# Patient Record
Sex: Female | Born: 1950
Health system: Southern US, Community
[De-identification: ages and names within clinical notes are randomized; demographics above are authoritative.]

## PROBLEM LIST (undated history)

## (undated) DIAGNOSIS — E785 Hyperlipidemia, unspecified: Secondary | ICD-10-CM

## (undated) DIAGNOSIS — I10 Essential (primary) hypertension: Secondary | ICD-10-CM

## (undated) DIAGNOSIS — E119 Type 2 diabetes mellitus without complications: Secondary | ICD-10-CM

## (undated) DIAGNOSIS — N189 Chronic kidney disease, unspecified: Secondary | ICD-10-CM

## (undated) HISTORY — PX: ABDOMINAL HYSTERECTOMY: SHX81

## (undated) HISTORY — DX: Chronic kidney disease, unspecified: N18.9

---

## 2000-04-05 ENCOUNTER — Other Ambulatory Visit: Admission: RE | Admit: 2000-04-05 | Discharge: 2000-04-05 | Payer: Self-pay | Admitting: Internal Medicine

## 2001-04-10 ENCOUNTER — Other Ambulatory Visit: Admission: RE | Admit: 2001-04-10 | Discharge: 2001-04-10 | Payer: Self-pay | Admitting: Hematology and Oncology

## 2001-04-13 ENCOUNTER — Encounter: Admission: RE | Admit: 2001-04-13 | Discharge: 2001-04-13 | Payer: Self-pay | Admitting: Internal Medicine

## 2001-04-13 ENCOUNTER — Encounter: Payer: Self-pay | Admitting: Internal Medicine

## 2001-06-28 ENCOUNTER — Ambulatory Visit (HOSPITAL_COMMUNITY): Admission: RE | Admit: 2001-06-28 | Discharge: 2001-06-28 | Payer: Self-pay | Admitting: *Deleted

## 2001-09-21 ENCOUNTER — Encounter: Payer: Self-pay | Admitting: Internal Medicine

## 2001-09-21 ENCOUNTER — Encounter: Admission: RE | Admit: 2001-09-21 | Discharge: 2001-09-21 | Payer: Self-pay | Admitting: Internal Medicine

## 2001-11-08 ENCOUNTER — Inpatient Hospital Stay (HOSPITAL_COMMUNITY): Admission: RE | Admit: 2001-11-08 | Discharge: 2001-11-10 | Payer: Self-pay | Admitting: Obstetrics & Gynecology

## 2001-11-08 ENCOUNTER — Encounter (INDEPENDENT_AMBULATORY_CARE_PROVIDER_SITE_OTHER): Payer: Self-pay

## 2002-04-30 ENCOUNTER — Encounter: Admission: RE | Admit: 2002-04-30 | Discharge: 2002-04-30 | Payer: Self-pay | Admitting: Internal Medicine

## 2002-04-30 ENCOUNTER — Encounter: Payer: Self-pay | Admitting: Internal Medicine

## 2003-05-24 ENCOUNTER — Encounter: Admission: RE | Admit: 2003-05-24 | Discharge: 2003-05-24 | Payer: Self-pay | Admitting: Internal Medicine

## 2004-06-05 ENCOUNTER — Encounter: Admission: RE | Admit: 2004-06-05 | Discharge: 2004-06-05 | Payer: Self-pay | Admitting: Internal Medicine

## 2005-06-16 ENCOUNTER — Encounter: Admission: RE | Admit: 2005-06-16 | Discharge: 2005-06-16 | Payer: Self-pay | Admitting: Internal Medicine

## 2007-02-24 ENCOUNTER — Ambulatory Visit (HOSPITAL_COMMUNITY): Admission: RE | Admit: 2007-02-24 | Discharge: 2007-02-24 | Payer: Self-pay | Admitting: Gastroenterology

## 2007-02-24 ENCOUNTER — Encounter (INDEPENDENT_AMBULATORY_CARE_PROVIDER_SITE_OTHER): Payer: Self-pay | Admitting: Gastroenterology

## 2010-05-19 NOTE — Op Note (Signed)
NAME:  Holly Peters, Holly Peters NO.:  [id number]   MEDICAL RECORD NO.:  [id number]          PATIENT TYPE:  AMB   LOCATION:  ENDO                         FACILITY:  [facility]   PHYSICIAN:  Nelwyn Salisbury, M.D.  DATE OF BIRTH:  Jul 29, 1950   DATE OF PROCEDURE:  02/24/2007  DATE OF DISCHARGE:                               OPERATIVE REPORT   PROCEDURE PERFORMED:  Colonoscopy with cold biopsies x 4.   ENDOSCOPIST:  Nelwyn Salisbury, M.D.   INSTRUMENT USED:  Pentax video colonoscope.   INDICATIONS FOR PROCEDURE:  60 year old African American female  undergoing screening colonoscopy to rule out colonic polyps, masses,  etc.   PREPROCEDURE PREPARATION:  Informed consent was procured from the  patient.  The patient was fasted for 8 hours prior to the procedure and  prepped with a bottle of magnesium citrate and a gallon of GoLYTELY the  night prior to the procedure. The risks and benefits of the procedure  including a 10% miss rate of cancer and polyps were discussed with the  patient, as well.   PREPROCEDURE PHYSICAL:  The patient had stable vital signs.  Her chest  was clear to auscultation.  S1 and S2 regular.  Abdomen soft with normal  bowel sounds.   DESCRIPTION OF PROCEDURE:  The patient was placed in the left lateral  decubitus position and sedated with 65 mcg of Fentanyl and 6 mg of  Versed given intravenously in slow incremental doses.  Once the patient  was adequately sedated and maintained on low flow oxygen and continuous  cardiac monitoring, the Pentax video colonoscope was advanced from the  rectum to the cecum. The appendiceal orifice and the cecal valve were  clearly visualized and photographed.  There was some patchy inflammation  noted at the base of the cecum around the appendiceal orifice of unclear  significance. Biopsies were done (cold biopsies x4).  No other masses,  polyps, erosions, ulcerations, etc., were seen. The terminal ileum  appeared healthy  without lesions. Retroflexion in the rectum revealed no  abnormalities.   IMPRESSION:  Normal colonoscopy up to the terminal ileum except for  patchy inflammation noted on the appendiceal orifice, cold biopsies done  x4.   RECOMMENDATIONS:  1. Await pathology results.  2. Avoid all nonsteroidals including aspirin for the next two weeks.  3. Repeat colonoscopy in the next ten years unless the patient has any      abnormal symptoms in the interim.  4. Outpatient follow up as need arises in the future.  Further      recommendations will be made depending on the pathology results.      Nelwyn Salisbury, M.D.  Electronically Signed     JNM/MEDQ  D:  02/24/2007  T:  02/24/2007  Job:  [id number]   cc:   Theda Belfast. Baird Cancer, M.D.  Fax: [phone]

## 2010-05-22 NOTE — Procedures (Signed)
[place]  Patient:    Holly Peters, Holly Peters Visit Number: [id number] MRN: [id number]          Service Type: END Location: ENDO Attending Physician:  Juanita Craver Dictated by:   Nelwyn Salisbury, M.D. Proc. Date: 06/28/01 Admit Date:  06/28/2001   CC:         Bryon Lions, M.D.   Procedure Report  DATE OF BIRTH:  13-May-1950  PROCEDURE PERFORMED:  Screening colonoscopy.  ENDOSCOPIST:  Nelwyn Salisbury, M.D.  INSTRUMENT:  Olympus video colonoscope.  INDICATION FOR PROCEDURE:  Guaiac-positive stool in a 60 year old African-American female.  Rule out colonic polyps, masses, hemorrhoids, etc.  PREPROCEDURE PREPARATION:  Informed consent was procured from the patient. The patient was fasting for eight hours prior to the procedure and prepped with a bottle of magnesium citrate and a gallon of NuLytely the night prior to the procedure.  PREPROCEDURE PHYSICAL:  VITAL SIGNS:  Stable.  NECK:  Supple.  CHEST:  Clear to auscultation.  HEART:  S1, S2 regular.  ABDOMEN:  Soft with normal bowel sounds.  DESCRIPTION OF THE PROCEDURE:  The patient was placed in the left lateral decubitus position and sedated with 20 mg of Demerol and 7.5 mg of Versed intravenously.  Once the patient was adequately sedated and maintained on low-flow oxygen and continuous cardiac monitoring, the Olympus video colonoscope was advanced from the rectum to the cecum entirely without difficulty.  The entire colonic mucosa appeared healthy with a normal vascular pattern.  No masses, polyps, erosions, or diverticula were seen.  IMPRESSION:  Normal colonoscopy.  RECOMMENDATIONS: 1. Repeat guaiac testing to be done on an outpatient basis and further    recommendations made as warranted. 2. High fiber diet with liberal fruit intake has been advocated. Dictated by:   Nelwyn Salisbury, M.D. Attending Physician:  Juanita Craver DD:  06/28/01 TD:  06/29/01 Job:  [id number]

## 2010-05-22 NOTE — H&P (Signed)
NAME:  Holly Peters, RILING NO.:  [id number]   MEDICAL RECORD NO.:  [id number]                   PATIENT TYPE:  INP   LOCATION:  NA                                   FACILITY:  [facility]   PHYSICIAN:  Agnes Lawrence, M.D.         DATE OF BIRTH:  Oct 31, 1950   DATE OF ADMISSION:  DATE OF DISCHARGE:                                HISTORY & PHYSICAL   HISTORY OF PRESENT ILLNESS:  The patient is a 60 year old with a small  myomatous uterus who presents for laparoscopic assisted total vaginal  hysterectomy with bilateral salpingo-oophorectomy.  The patient has a long  history of uterine fibroids.  She complains of lower abdominal pain, back  pain, increasing abdominal girth, and urinary frequency.  Work-up to date  has included a pelvic ultrasound on September 18 which demonstrated a normal  sized uterus with multiple myomas.   PAST OB/GYN HISTORY:  She is status post two NSVDs.  Her last Pap smear was  in April 2003 and as per the patient's report was normal.  Last mammogram  was April as well and was normal.   PAST GYN SURGERY:  She has had a D&C in 1996 for abnormal uterine bleeding.   PAST MEDICAL HISTORY:  Type 2 diabetes mellitus and chronic hypertension.   PAST SURGICAL HISTORY:  As above.   MEDICATIONS:  The patient reports high blood pressure medication and  diabetes medication.   ALLERGIES:  No known drug allergies.   SOCIAL HISTORY:  She is married, living with her spouse.  Occupation:  She  is a Radiation protection practitioner.  She denies any tobacco use or any  significant history of alcohol use.  She does not use caffeinated beverages  and denies any illicit drug use.   FAMILY HISTORY:  Remarkable for adult-onset diabetes.   REVIEW OF SYMPTOMS:  GENITOURINARY:  As per history of present illness.  She  does have mild stress incontinence.   PHYSICAL EXAMINATION:  VITAL SIGNS:  Weight 160 pounds, temperature 98.8,  pulse 70, blood  pressure 140/88, respirations 20.  GENERAL:  African-American female appears stated age.  Normal body habitus.  LUNGS:  Clear to auscultation bilaterally.  HEART:  Regular rate and rhythm.  ABDOMEN:  Soft, nontender without masses.  Bowel sounds active.  PELVIC:  On female examination the external female genitalia are normal  appearing.  On speculum examination there is scant thin white discharge.  Cervix is clear, firm, and closed.  No visible lesions.  On bimanual  examination the uterus is mildly enlarged.  The position is anteverted.  No  tenderness noted.  On examination of the adnexa no masses or organomegaly,  nontender.   ASSESSMENT:  Small leiomyomatous uterus, symptomatic.   PLAN:  Laparoscopic assisted vaginal hysterectomy with prophylactic  bilateral salpingo-oophorectomy.  The risks, benefits, and alternatives of  surgery were discussed at length with the patient.  The patient was allowed  to ask  questions and stated satisfaction.                                               Agnes Lawrence, M.D.    Howell Pringle  D:  10/17/2001  T:  10/17/2001  Job:  [id number]

## 2010-05-22 NOTE — Op Note (Signed)
NAME:  ACEY, DOBBERSTEIN                     ACCOUNT NO.:  [id number]   MEDICAL RECORD NO.:  [id number]                   PATIENT TYPE:  INP   LOCATION:  9323                                 FACILITY:  [facility]   PHYSICIAN:  Agnes Lawrence, M.D.         DATE OF BIRTH:  August 26, 1950   DATE OF PROCEDURE:  11/08/2001  DATE OF DISCHARGE:                                 OPERATIVE REPORT   PREOPERATIVE DIAGNOSIS:  Uterine fibroids.   POSTOPERATIVE DIAGNOSES:  1. Uterine fibroids.  2. Pelvic adhesions.   PROCEDURE:  Laparoscopic-assisted total vaginal hysterectomy with bilateral  salpingo-oophorectomy.   SURGEON:  Agnes Lawrence, M.D.  Charles A. Jodi Mourning, M.D.   ANESTHESIA:  General endotracheal.   COMPLICATIONS:  None.   ESTIMATED BLOOD LOSS:  500 cc.   FLUIDS AND URINE OUTPUT:  As per the anesthesiologist.   FINDINGS AT LAPAROSCOPY:  There were adhesions involving the left fallopian  tube, ovary, and sigmoid colon.  The uterus was irregular in contour and  retroverted.   PROCEDURE:  The risks, benefits, indications, and alternatives of the  procedure were reviewed with the patient and informed consent was obtained.  The patient was taken to the operating room with an IV running.  The patient  was placed in the dorsal lithotomy position and prepped and draped in the  usual sterile fashion.  Bivalve speculum was then placed in the patient's  vagina and the anterior lip of the cervix was grasped with single-tooth  tenaculum.  A Hulka manipulator was then advanced into the uterus and  secured to the anterior lip of the cervix as a means to manipulate the  uterus.  The single-tooth tenaculum and speculum were then removed from the  vagina.  Attention was then turned to the patient's abdomen where a  10-mm skin incision was made in the umbilical fold.  The Veress needle was  then carefully introduced into the peritoneal cavity at a 45-degree angle  while tenting the  abdominal wall.  Intraperitoneal placement was confirmed  by use of a water-filled syringe and a drop in the intra-abdominal pressure  with insufflation of the CO2 gas.  The trocar and sleeve were then advanced  without difficulty into the abdomen where intra-abdominal placement was  confirmed by the laparoscope.  Two incisions were then made in the lower  quadrants.  The second and third trocar and sleeve were then advanced under  direct visualization.  Survey of the patient's pelvis revealed the above  findings.  Using the Harmonic scalpel, the adhesions described above were  lysed.  The round ligament on the left side was coagulated and transected  with the Harmonic scalpel.  The infundibulopelvic ligament was then  skeletonized and coagulated and transected with the Harmonic scalpel.  Excellent hemostasis was noted.  This was repeated on the patient's right.  The anterior leaf of the broad ligament was incised along the bladder  reflection to the midline from both  sides using the Harmonic scalpel.  At  this point, the vaginal part of the procedure was then performed.  A  weighted speculum was then placed into the vagina and the cervix grasped  with a Jacobs tenaculum.  The cervix was then injected circumferentially  with 1% lidocaine with 1:200,000 of epinephrine.  The cervix was then  circumferentially incised with the scalpel and the bladder dissected off the  pubovesical cervical fascia anteriorly with the Metzenbaum scissors.  The  anterior cul-de-sac was entered sharply.  The same procedure was performed  posteriorly and the posterior cul-de-sac was entered sharply without  difficulty.  At this point, Haney clamps were placed over the uterosacral  ligaments on either side.  These were then transected and suture ligated  with 0 Vicryl.  Hemostasis was assured.  The cardinal ligaments were clamped  on both sides, transected, and suture ligated in similar fashion.  The  uterine  arteries and broad ligaments were then serially clamped with Haney  clamps, transected, and suture ligated on both sides.  Excellent hemostasis  was noted.  At this point, a coring technique was used to morcellate the  uterus.  The uterus was then delivered along with the adnexa.  Any remaining  adhesions or broad ligament connections were clamped, transected, and suture  ligated.  The vaginal cuff angle was then closed with figure-of-eight  sutures of 0 Vicryl.  These were then plicated in the midline.  The  remainder of the vaginal cuff was closed with figure-of-eight stitches of 0  Vicryl in an interrupted fashion.  At this point, the abdomen was again  insufflated with CO2 gas and the vascular pedicles as well as the vaginal  cuff were reinspected and hemostasis was felt to be adequate.  The  instruments were then removed from the patient's abdomen and the incisions  repaired with 3-0 Vicryl.  The patient tolerated the procedure well.  Sponge, lap, and needle counts were correct x2.  The patient was taken to  the PACU in stable condition.                                               Agnes Lawrence, M.D.    Howell Pringle  D:  11/09/2001  T:  11/09/2001  Job:  [id number]

## 2012-07-31 ENCOUNTER — Emergency Department (HOSPITAL_BASED_OUTPATIENT_CLINIC_OR_DEPARTMENT_OTHER)
Admission: EM | Admit: 2012-07-31 | Discharge: 2012-07-31 | Disposition: A | Discharge status: Home / Self Care | Attending: Emergency Medicine | Admitting: Emergency Medicine

## 2012-07-31 ENCOUNTER — Encounter (HOSPITAL_BASED_OUTPATIENT_CLINIC_OR_DEPARTMENT_OTHER): Payer: Self-pay | Admitting: *Deleted

## 2012-07-31 DIAGNOSIS — E785 Hyperlipidemia, unspecified: Secondary | ICD-10-CM | POA: Insufficient documentation

## 2012-07-31 DIAGNOSIS — Z79899 Other long term (current) drug therapy: Secondary | ICD-10-CM | POA: Insufficient documentation

## 2012-07-31 DIAGNOSIS — I10 Essential (primary) hypertension: Secondary | ICD-10-CM | POA: Insufficient documentation

## 2012-07-31 DIAGNOSIS — Y939 Activity, unspecified: Secondary | ICD-10-CM | POA: Insufficient documentation

## 2012-07-31 DIAGNOSIS — S058X9A Other injuries of unspecified eye and orbit, initial encounter: Secondary | ICD-10-CM | POA: Insufficient documentation

## 2012-07-31 DIAGNOSIS — S0502XA Injury of conjunctiva and corneal abrasion without foreign body, left eye, initial encounter: Secondary | ICD-10-CM

## 2012-07-31 DIAGNOSIS — Y929 Unspecified place or not applicable: Secondary | ICD-10-CM | POA: Insufficient documentation

## 2012-07-31 DIAGNOSIS — X58XXXA Exposure to other specified factors, initial encounter: Secondary | ICD-10-CM | POA: Insufficient documentation

## 2012-07-31 DIAGNOSIS — E119 Type 2 diabetes mellitus without complications: Secondary | ICD-10-CM | POA: Insufficient documentation

## 2012-07-31 HISTORY — DX: Essential (primary) hypertension: I10

## 2012-07-31 HISTORY — DX: Type 2 diabetes mellitus without complications: E11.9

## 2012-07-31 HISTORY — DX: Hyperlipidemia, unspecified: E78.5

## 2012-07-31 MED ORDER — FLUORESCEIN SODIUM 1 MG OP STRP
ORAL_STRIP | OPHTHALMIC | Status: AC
  Filled 2012-07-31: qty 1

## 2012-07-31 MED ORDER — TETRACAINE HCL 0.5 % OP SOLN
OPHTHALMIC | Status: AC
  Filled 2012-07-31: qty 2

## 2012-07-31 MED ORDER — TOBRAMYCIN 0.3 % OP SOLN: 1.0000 [drp] | OPHTHALMIC | Status: DC

## 2012-07-31 MED ORDER — HYDROCODONE-ACETAMINOPHEN 5-325 MG PO TABS: 2.0000 | ORAL_TABLET | ORAL | Status: DC | PRN

## 2012-07-31 NOTE — ED Provider Notes (Signed)
CSN: [id number]     Arrival date & time 07/31/12  1829 History  This chart was scribed for non-physician practitioner working with Threasa Beards, MD by Jenne Campus, ED Scribe. This patient was seen in room MH10/MH10 and the patient's care was started at 8:28 PM.   First MD Initiated Contact with Patient 07/31/12 1935     Chief Complaint  Patient presents with  . Eye Problem    Patient is a 62 y.o. female presenting with eye problem. The history is provided by the patient. No language interpreter was used.  Eye Problem Location:  L eye Quality:  Foreign body sensation (sharp) Severity:  Moderate Onset quality:  Gradual Duration:  1 day Timing:  Constant Progression:  Worsening Chronicity:  New Context: not contact lens problem   Relieved by:  Closing eye Worsened by:  Eye movement Associated symptoms: no decreased vision, no itching and no redness     HPI Comments: Holly Peters is a 62 y.o. female who presents to the Emergency Department complaining of diffuse left eye pain that started upon waking yesterday. She describes the pain as a constant foreign body sensation n the medial aspect that became sharp after rubbing the eye. She denies sleeping under a fan or sleeping withy the window open. She denies having any chronic eye conditions or prior episodes of the same. She denies redness, drainage or visual changes. She has a h/o DM, HLD and HTN.Pt denies smoking and alcohol use.  Past Medical History  Diagnosis Date  . Hypertension   . Hyperlipemia   . Diabetes mellitus without complication    Past Surgical History  Procedure Laterality Date  . Abdominal hysterectomy     History reviewed. No pertinent family history. History  Substance Use Topics  . Smoking status: Never Smoker   . Smokeless tobacco: Not on file  . Alcohol Use: No   No OB history provided.  Review of Systems  Eyes: Positive for pain. Negative for redness and itching.  All other systems  reviewed and are negative.    Allergies  Review of patient's allergies indicates no known allergies.  Home Medications   Current Outpatient Rx  Name  Route  Sig  Dispense  Refill  . diltiazem (TIAZAC) 240 MG 24 hr capsule   Oral   Take 240 mg by mouth daily.         [name] olmesartan-hydrochlorothiazide (BENICAR HCT) 20-12.5 MG per tablet   Oral   Take 1 tablet by mouth daily.         . simvastatin (ZOCOR) 20 MG tablet   Oral   Take 20 mg by mouth every evening.         . sitaGLIPtan-metformin (JANUMET) 50-500 MG per tablet   Oral   Take 1 tablet by mouth 2 (two) times daily with a meal.          Triage Vitals: BP 150/80  Pulse 88  Temp(Src) 98.6 F (37 C)  Resp 16  Ht 5\' 1"  (1.549 m)  Wt 141 lb (63.957 kg)  BMI 26.66 kg/m2  Physical Exam  Nursing note and vitals reviewed. Constitutional: She is oriented to person, place, and time. She appears well-developed and well-nourished. No distress.  HENT:  Head: Normocephalic and atraumatic.  Eyes: EOM are normal.  Dark spot that appears vascular at 11 o'clock to the left eye, corneal abrasion to left eye, mild amount of mucus  Neck: Neck supple. No tracheal deviation present.  Cardiovascular: Normal  rate.   Pulmonary/Chest: Effort normal. No respiratory distress.  Musculoskeletal: Normal range of motion.  Neurological: She is alert and oriented to person, place, and time.  Skin: Skin is warm and dry.  Psychiatric: She has a normal mood and affect. Her behavior is normal.    ED Course   Procedures (including critical care time)  DIAGNOSTIC STUDIES: None performed.  COORDINATION OF CARE: 8:30 PM-Administered one drop of tetracaine and fluorescein to the left eye with pt's permission. 8:38 PM-Informed pt of eye exam findings. Discussed discharge plan which includes antibiotics with pt and pt agreed to plan. Also advised pt to follow up with her opthalmologist tomorrow and pt agreed. Addressed symptoms to return  for with pt.   Labs Reviewed - No data to display No results found. 1. Corneal abrasion, left, initial encounter     MDM  Dr. Canary Brim in to see  No obvious foreign body. I personally performed the services in this documentation, which was scribed in my presence.  The recorded information has been reviewed and considered.   Ronnald Collum.   [place], PA-C 07/31/12 2101

## 2012-07-31 NOTE — ED Provider Notes (Signed)
Medical screening examination/treatment/procedure(s) were conducted as a shared visit with non-physician practitioner(s) and myself.  I personally evaluated the patient during the encounter  Threasa Beards, MD 07/31/12 2106

## 2012-07-31 NOTE — ED Notes (Signed)
Pt c/o left eye pain ? Foreign object x 1 day

## 2014-05-20 DIAGNOSIS — L309 Dermatitis, unspecified: Secondary | ICD-10-CM | POA: Diagnosis not present

## 2014-05-20 DIAGNOSIS — E1122 Type 2 diabetes mellitus with diabetic chronic kidney disease: Secondary | ICD-10-CM | POA: Diagnosis not present

## 2014-05-20 DIAGNOSIS — N183 Chronic kidney disease, stage 3 (moderate): Secondary | ICD-10-CM | POA: Diagnosis not present

## 2014-05-20 DIAGNOSIS — N08 Glomerular disorders in diseases classified elsewhere: Secondary | ICD-10-CM | POA: Diagnosis not present

## 2014-05-20 DIAGNOSIS — I129 Hypertensive chronic kidney disease with stage 1 through stage 4 chronic kidney disease, or unspecified chronic kidney disease: Secondary | ICD-10-CM | POA: Diagnosis not present

## 2014-05-24 DIAGNOSIS — H25813 Combined forms of age-related cataract, bilateral: Secondary | ICD-10-CM | POA: Diagnosis not present

## 2014-05-24 DIAGNOSIS — E119 Type 2 diabetes mellitus without complications: Secondary | ICD-10-CM | POA: Diagnosis not present

## 2014-05-27 DIAGNOSIS — N183 Chronic kidney disease, stage 3 (moderate): Secondary | ICD-10-CM | POA: Diagnosis not present

## 2014-05-27 DIAGNOSIS — I129 Hypertensive chronic kidney disease with stage 1 through stage 4 chronic kidney disease, or unspecified chronic kidney disease: Secondary | ICD-10-CM | POA: Diagnosis not present

## 2014-05-27 DIAGNOSIS — E780 Pure hypercholesterolemia: Secondary | ICD-10-CM | POA: Diagnosis not present

## 2014-05-27 DIAGNOSIS — E1129 Type 2 diabetes mellitus with other diabetic kidney complication: Secondary | ICD-10-CM | POA: Diagnosis not present

## 2014-09-05 DIAGNOSIS — Z23 Encounter for immunization: Secondary | ICD-10-CM | POA: Diagnosis not present

## 2014-09-05 DIAGNOSIS — N183 Chronic kidney disease, stage 3 (moderate): Secondary | ICD-10-CM | POA: Diagnosis not present

## 2014-09-05 DIAGNOSIS — I129 Hypertensive chronic kidney disease with stage 1 through stage 4 chronic kidney disease, or unspecified chronic kidney disease: Secondary | ICD-10-CM | POA: Diagnosis not present

## 2014-09-05 DIAGNOSIS — E1122 Type 2 diabetes mellitus with diabetic chronic kidney disease: Secondary | ICD-10-CM | POA: Diagnosis not present

## 2014-09-05 DIAGNOSIS — N08 Glomerular disorders in diseases classified elsewhere: Secondary | ICD-10-CM | POA: Diagnosis not present

## 2014-09-05 DIAGNOSIS — Z79899 Other long term (current) drug therapy: Secondary | ICD-10-CM | POA: Diagnosis not present

## 2014-12-09 DIAGNOSIS — Z1231 Encounter for screening mammogram for malignant neoplasm of breast: Secondary | ICD-10-CM | POA: Diagnosis not present

## 2015-01-02 DIAGNOSIS — E1122 Type 2 diabetes mellitus with diabetic chronic kidney disease: Secondary | ICD-10-CM | POA: Diagnosis not present

## 2015-01-02 DIAGNOSIS — N183 Chronic kidney disease, stage 3 (moderate): Secondary | ICD-10-CM | POA: Diagnosis not present

## 2015-01-02 DIAGNOSIS — N08 Glomerular disorders in diseases classified elsewhere: Secondary | ICD-10-CM | POA: Diagnosis not present

## 2015-01-02 DIAGNOSIS — I129 Hypertensive chronic kidney disease with stage 1 through stage 4 chronic kidney disease, or unspecified chronic kidney disease: Secondary | ICD-10-CM | POA: Diagnosis not present

## 2015-03-13 ENCOUNTER — Emergency Department (HOSPITAL_BASED_OUTPATIENT_CLINIC_OR_DEPARTMENT_OTHER): Payer: Medicare Other

## 2015-03-13 ENCOUNTER — Encounter (HOSPITAL_BASED_OUTPATIENT_CLINIC_OR_DEPARTMENT_OTHER): Payer: Self-pay | Admitting: *Deleted

## 2015-03-13 ENCOUNTER — Emergency Department (HOSPITAL_BASED_OUTPATIENT_CLINIC_OR_DEPARTMENT_OTHER)
Admission: EM | Admit: 2015-03-13 | Discharge: 2015-03-14 | Disposition: A | Discharge status: Home / Self Care | Payer: Medicare Other | Attending: Emergency Medicine | Admitting: Emergency Medicine

## 2015-03-13 DIAGNOSIS — Y998 Other external cause status: Secondary | ICD-10-CM | POA: Insufficient documentation

## 2015-03-13 DIAGNOSIS — Z79899 Other long term (current) drug therapy: Secondary | ICD-10-CM | POA: Insufficient documentation

## 2015-03-13 DIAGNOSIS — M79674 Pain in right toe(s): Secondary | ICD-10-CM

## 2015-03-13 DIAGNOSIS — E119 Type 2 diabetes mellitus without complications: Secondary | ICD-10-CM | POA: Insufficient documentation

## 2015-03-13 DIAGNOSIS — Z7984 Long term (current) use of oral hypoglycemic drugs: Secondary | ICD-10-CM | POA: Diagnosis not present

## 2015-03-13 DIAGNOSIS — Y93E1 Activity, personal bathing and showering: Secondary | ICD-10-CM | POA: Insufficient documentation

## 2015-03-13 DIAGNOSIS — E785 Hyperlipidemia, unspecified: Secondary | ICD-10-CM | POA: Insufficient documentation

## 2015-03-13 DIAGNOSIS — Y9289 Other specified places as the place of occurrence of the external cause: Secondary | ICD-10-CM | POA: Diagnosis not present

## 2015-03-13 DIAGNOSIS — M79671 Pain in right foot: Secondary | ICD-10-CM | POA: Diagnosis not present

## 2015-03-13 DIAGNOSIS — L089 Local infection of the skin and subcutaneous tissue, unspecified: Secondary | ICD-10-CM | POA: Insufficient documentation

## 2015-03-13 DIAGNOSIS — W2209XA Striking against other stationary object, initial encounter: Secondary | ICD-10-CM | POA: Diagnosis not present

## 2015-03-13 DIAGNOSIS — I10 Essential (primary) hypertension: Secondary | ICD-10-CM | POA: Insufficient documentation

## 2015-03-13 DIAGNOSIS — S99921A Unspecified injury of right foot, initial encounter: Secondary | ICD-10-CM | POA: Diagnosis not present

## 2015-03-13 DIAGNOSIS — M7989 Other specified soft tissue disorders: Secondary | ICD-10-CM | POA: Diagnosis not present

## 2015-03-13 NOTE — ED Provider Notes (Signed)
CSN: [id number]     Arrival date & time 03/13/15  2059 History   First MD Initiated Contact with Patient 03/13/15 2313     Chief Complaint  Patient presents with  . Toe Injury     (Consider location/radiation/quality/duration/timing/severity/associated sxs/prior Treatment) HPI Comments: Patient states she "stubbed" her right great toe on the tub last week.  She has noted increased swelling and redness in the last several days.  Patient is a 65 y.o. female presenting with lower extremity pain. The history is provided by the patient. No language interpreter was used.  Foot Pain This is a new problem. The current episode started in the past 7 days. The problem occurs 2 to 4 times per day. The problem has been waxing and waning. The symptoms are aggravated by walking. She has tried acetaminophen and rest for the symptoms. The treatment provided mild relief.    Past Medical History  Diagnosis Date  . Hypertension   . Hyperlipemia   . Diabetes mellitus without complication [facility])    Past Surgical History  Procedure Laterality Date  . Abdominal hysterectomy     No family history on file. Social History  Substance Use Topics  . Smoking status: Never Smoker   . Smokeless tobacco: None  . Alcohol Use: No   OB History    No data available     Review of Systems  All other systems reviewed and are negative.     Allergies  Review of patient's allergies indicates no known allergies.  Home Medications   Prior to Admission medications   Medication Sig Start Date End Date Taking? Authorizing Provider  diltiazem (TIAZAC) 240 MG 24 hr capsule Take 240 mg by mouth daily.    Historical Provider, MD  HYDROcodone-acetaminophen (NORCO/VICODIN) 5-325 MG per tablet Take 2 tablets by mouth every 4 (four) hours as needed for pain. 07/31/12   Fransico Meadow, PA-C  olmesartan-hydrochlorothiazide (BENICAR HCT) 20-12.5 MG per tablet Take 1 tablet by mouth daily.    Historical Provider, MD    simvastatin (ZOCOR) 20 MG tablet Take 20 mg by mouth every evening.    Historical Provider, MD  sitaGLIPtan-metformin (JANUMET) 50-500 MG per tablet Take 1 tablet by mouth 2 (two) times daily with a meal.    Historical Provider, MD  tobramycin (TOBREX) 0.3 % ophthalmic solution Place 1 drop into the left eye every 4 (four) hours. 07/31/12   Fransico Meadow, PA-C   BP 152/79 mmHg  Pulse 90  Temp(Src) 97.9 F (36.6 C) (Oral)  Resp 20  Ht 5' 1.5" (1.562 m)  Wt 62.596 kg  BMI 25.66 kg/m2  SpO2 100% Physical Exam  Constitutional: She is oriented to person, place, and time. She appears well-developed and well-nourished.  HENT:  Head: Normocephalic.  Eyes: Conjunctivae are normal.  Neck: Neck supple.  Cardiovascular: Normal rate and regular rhythm.   Pulmonary/Chest: Effort normal and breath sounds normal.  Abdominal: Soft. Bowel sounds are normal.  Musculoskeletal: She exhibits edema and tenderness.       Feet:  Lymphadenopathy:    She has no cervical adenopathy.  Neurological: She is alert and oriented to person, place, and time.  Skin: Skin is warm and dry.  Psychiatric: She has a normal mood and affect.  Nursing note and vitals reviewed.   ED Course  Procedures (including critical care time) Labs Review Labs Reviewed - No data to display  Imaging Review Dg Foot Complete Right  03/13/2015  CLINICAL DATA:  stubbed right toe  Thursday morning; seemed fine for the next day or two then started having pain in distal 1st metatarsal x 5 days with redness and swelling gradually appearing EXAM: RIGHT FOOT COMPLETE - 3+ VIEW COMPARISON:  None. FINDINGS: There is no evidence of fracture or dislocation. No acute osseous abnormality. Diffuse small vessel calcification. IMPRESSION: No acute findings Electronically Signed   By: Skipper Cliche M.D.   On: 03/13/2015 22:01   I have personally reviewed and evaluated these images and lab results as part of my medical decision-making.   EKG  Interpretation None     Radiology results reviewed and shared with patient. Erythema without increased warmth.   MDM   Final diagnoses:  None    Right great toe pain. Localized inflammation at MTP joint. No indication of infection. Care instructions provided. Return precautions discussed. Follow-up with PCP.    Etta Quill, NP 03/14/15 [id number]  Everlene Balls, MD 03/14/15 [id number]

## 2015-03-13 NOTE — ED Notes (Signed)
She jammed her right foot when she was getting in the shower last week. Pain to her great toe. Swelling to her foot.

## 2015-03-13 NOTE — Discharge Instructions (Signed)
Hallux Rigidus Hallux rigidus is a condition involving pain and a loss of motion of the first (big) toe. The pain gets worse with lifting up (extension) of the toe. This is usually due to arthritic bony bumps (spurring) of the joint at the base of the big toe.  SYMPTOMS   Pain, with lifting up of the toe.  Tenderness over the joint where the big toe meets the foot.  Redness, swelling, and warmth over the top of the base of the big toe (sometimes).  Foot pain, stiffness, and limping. CAUSES  Hallux rigidus is caused by arthritis of the joint where the big toe meets the foot. The arthritis creates a bone spur that pinches the soft tissues when the toe is extended. RISK INCREASES WITH:  Tight shoes with a narrow toe box.  Family history of foot problems.  Gout and rheumatoid and psoriatic arthritis.  History of previous toe injury, including "turf toe."  Long first toe, flat feet, and other big toe bony bumps.  Arthritis of the big toe. PREVENTION   Wear wide-toed shoes that fit well.  Tape the big toe to reduce motion and to prevent pinching of the tissues between the bone.  Maintain physical fitness:  Foot and ankle flexibility.  Muscle strength and endurance. PROGNOSIS  This condition can usually be managed with proper treatment. However, surgery is typically required to prevent the problem from recurring.  RELATED COMPLICATIONS  Injury to other areas of the foot or ankle, caused by abnormal walking in an attempt to avoid the pain felt when walking normally. TREATMENT Treatment first involves stopping the activities that aggravate your symptoms. Ice and medicine can be used to reduce the pain and inflammation. Modifications to shoes may help reduce pain, including wearing stiff-soled shoes, shoes with a wide toe box, inserting a padded donut to relieve pressure on top of the joint, or wearing an arch support. Corticosteroid injections may be given to reduce  inflammation. MEDICATION   If pain medicine is needed, nonsteroidal anti-inflammatory medicines (aspirin and ibuprofen), or other minor pain relievers (acetaminophen), are often advised.  Do not take pain medicine for 7 days before surgery.  Prescription pain relievers are usually prescribed only after surgery. Use only as directed and only as much as you need.  Ointments for arthritis, applied to the skin, may give some relief.  Injections of corticosteroids may be given to reduce inflammation. HEAT AND COLD  Cold treatment (icing) relieves pain and reduces inflammation. Cold treatment should be applied for 10 to 15 minutes every 2 to 3 hours, and immediately after activity that aggravates your symptoms. Use ice packs or an ice massage.  Heat treatment may be used before performing the stretching and strengthening activities prescribed by your caregiver, physical therapist, or athletic trainer. Use a heat pack or a warm water soak. SEEK MEDICAL CARE IF:   Symptoms get worse or do not improve in 2 weeks, despite treatment.  After surgery you develop fever, increasing pain, redness, swelling, drainage of fluids, bleeding, or increasing warmth.  New, unexplained symptoms develop. (Drugs used in treatment may produce side effects.)   This information is not intended to replace advice given to you by your health care provider. Make sure you discuss any questions you have with your health care provider.   Document Released: 12/21/2004 Document Revised: 01/11/2014 Document Reviewed: 04/04/2008 Elsevier Interactive Patient Education [employer].

## 2015-03-14 DIAGNOSIS — S99921A Unspecified injury of right foot, initial encounter: Secondary | ICD-10-CM | POA: Diagnosis not present

## 2015-03-25 DIAGNOSIS — I129 Hypertensive chronic kidney disease with stage 1 through stage 4 chronic kidney disease, or unspecified chronic kidney disease: Secondary | ICD-10-CM | POA: Diagnosis not present

## 2015-03-25 DIAGNOSIS — N08 Glomerular disorders in diseases classified elsewhere: Secondary | ICD-10-CM | POA: Diagnosis not present

## 2015-03-25 DIAGNOSIS — E559 Vitamin D deficiency, unspecified: Secondary | ICD-10-CM | POA: Diagnosis not present

## 2015-03-25 DIAGNOSIS — E1122 Type 2 diabetes mellitus with diabetic chronic kidney disease: Secondary | ICD-10-CM | POA: Diagnosis not present

## 2015-03-25 DIAGNOSIS — Z0000 Encounter for general adult medical examination without abnormal findings: Secondary | ICD-10-CM | POA: Diagnosis not present

## 2015-03-25 DIAGNOSIS — N183 Chronic kidney disease, stage 3 (moderate): Secondary | ICD-10-CM | POA: Diagnosis not present

## 2015-05-02 DIAGNOSIS — M65311 Trigger thumb, right thumb: Secondary | ICD-10-CM | POA: Diagnosis not present

## 2015-05-30 DIAGNOSIS — M654 Radial styloid tenosynovitis [de Quervain]: Secondary | ICD-10-CM | POA: Diagnosis not present

## 2015-05-30 DIAGNOSIS — M65311 Trigger thumb, right thumb: Secondary | ICD-10-CM | POA: Diagnosis not present

## 2015-07-01 DIAGNOSIS — M65311 Trigger thumb, right thumb: Secondary | ICD-10-CM | POA: Diagnosis not present

## 2015-07-01 DIAGNOSIS — M654 Radial styloid tenosynovitis [de Quervain]: Secondary | ICD-10-CM | POA: Diagnosis not present

## 2015-07-03 DIAGNOSIS — I129 Hypertensive chronic kidney disease with stage 1 through stage 4 chronic kidney disease, or unspecified chronic kidney disease: Secondary | ICD-10-CM | POA: Diagnosis not present

## 2015-07-03 DIAGNOSIS — N183 Chronic kidney disease, stage 3 (moderate): Secondary | ICD-10-CM | POA: Diagnosis not present

## 2015-07-03 DIAGNOSIS — E7800 Pure hypercholesterolemia, unspecified: Secondary | ICD-10-CM | POA: Diagnosis not present

## 2015-07-03 DIAGNOSIS — E1129 Type 2 diabetes mellitus with other diabetic kidney complication: Secondary | ICD-10-CM | POA: Diagnosis not present

## 2015-07-14 DIAGNOSIS — M65311 Trigger thumb, right thumb: Secondary | ICD-10-CM | POA: Diagnosis not present

## 2015-07-14 DIAGNOSIS — M654 Radial styloid tenosynovitis [de Quervain]: Secondary | ICD-10-CM | POA: Diagnosis not present

## 2015-07-16 DIAGNOSIS — H43822 Vitreomacular adhesion, left eye: Secondary | ICD-10-CM | POA: Diagnosis not present

## 2015-07-16 DIAGNOSIS — E119 Type 2 diabetes mellitus without complications: Secondary | ICD-10-CM | POA: Diagnosis not present

## 2015-07-16 DIAGNOSIS — H25013 Cortical age-related cataract, bilateral: Secondary | ICD-10-CM | POA: Diagnosis not present

## 2015-07-23 DIAGNOSIS — E1122 Type 2 diabetes mellitus with diabetic chronic kidney disease: Secondary | ICD-10-CM | POA: Diagnosis not present

## 2015-07-23 DIAGNOSIS — N183 Chronic kidney disease, stage 3 (moderate): Secondary | ICD-10-CM | POA: Diagnosis not present

## 2015-07-23 DIAGNOSIS — N08 Glomerular disorders in diseases classified elsewhere: Secondary | ICD-10-CM | POA: Diagnosis not present

## 2015-07-23 DIAGNOSIS — I129 Hypertensive chronic kidney disease with stage 1 through stage 4 chronic kidney disease, or unspecified chronic kidney disease: Secondary | ICD-10-CM | POA: Diagnosis not present

## 2015-07-28 DIAGNOSIS — M65311 Trigger thumb, right thumb: Secondary | ICD-10-CM | POA: Diagnosis not present

## 2015-07-28 DIAGNOSIS — Z4789 Encounter for other orthopedic aftercare: Secondary | ICD-10-CM | POA: Diagnosis not present

## 2015-08-07 DIAGNOSIS — I129 Hypertensive chronic kidney disease with stage 1 through stage 4 chronic kidney disease, or unspecified chronic kidney disease: Secondary | ICD-10-CM | POA: Diagnosis not present

## 2015-08-25 DIAGNOSIS — Z4789 Encounter for other orthopedic aftercare: Secondary | ICD-10-CM | POA: Diagnosis not present

## 2015-08-25 DIAGNOSIS — M65311 Trigger thumb, right thumb: Secondary | ICD-10-CM | POA: Diagnosis not present

## 2015-09-01 ENCOUNTER — Other Ambulatory Visit: Payer: Self-pay

## 2015-10-07 DIAGNOSIS — M654 Radial styloid tenosynovitis [de Quervain]: Secondary | ICD-10-CM | POA: Diagnosis not present

## 2015-10-07 DIAGNOSIS — Z4789 Encounter for other orthopedic aftercare: Secondary | ICD-10-CM | POA: Diagnosis not present

## 2015-10-07 DIAGNOSIS — M65311 Trigger thumb, right thumb: Secondary | ICD-10-CM | POA: Diagnosis not present

## 2015-10-27 DIAGNOSIS — Z23 Encounter for immunization: Secondary | ICD-10-CM | POA: Diagnosis not present

## 2015-11-25 DIAGNOSIS — N183 Chronic kidney disease, stage 3 (moderate): Secondary | ICD-10-CM | POA: Diagnosis not present

## 2015-11-25 DIAGNOSIS — N08 Glomerular disorders in diseases classified elsewhere: Secondary | ICD-10-CM | POA: Diagnosis not present

## 2015-11-25 DIAGNOSIS — I129 Hypertensive chronic kidney disease with stage 1 through stage 4 chronic kidney disease, or unspecified chronic kidney disease: Secondary | ICD-10-CM | POA: Diagnosis not present

## 2015-11-25 DIAGNOSIS — E1122 Type 2 diabetes mellitus with diabetic chronic kidney disease: Secondary | ICD-10-CM | POA: Diagnosis not present

## 2015-12-23 DIAGNOSIS — Z1231 Encounter for screening mammogram for malignant neoplasm of breast: Secondary | ICD-10-CM | POA: Diagnosis not present

## 2016-03-09 DIAGNOSIS — N183 Chronic kidney disease, stage 3 (moderate): Secondary | ICD-10-CM | POA: Diagnosis not present

## 2016-03-09 DIAGNOSIS — E1122 Type 2 diabetes mellitus with diabetic chronic kidney disease: Secondary | ICD-10-CM | POA: Diagnosis not present

## 2016-03-09 DIAGNOSIS — N08 Glomerular disorders in diseases classified elsewhere: Secondary | ICD-10-CM | POA: Diagnosis not present

## 2016-03-09 DIAGNOSIS — I129 Hypertensive chronic kidney disease with stage 1 through stage 4 chronic kidney disease, or unspecified chronic kidney disease: Secondary | ICD-10-CM | POA: Diagnosis not present

## 2016-03-09 DIAGNOSIS — E559 Vitamin D deficiency, unspecified: Secondary | ICD-10-CM | POA: Diagnosis not present

## 2016-06-30 DIAGNOSIS — E1129 Type 2 diabetes mellitus with other diabetic kidney complication: Secondary | ICD-10-CM | POA: Diagnosis not present

## 2016-06-30 DIAGNOSIS — E7800 Pure hypercholesterolemia, unspecified: Secondary | ICD-10-CM | POA: Diagnosis not present

## 2016-06-30 DIAGNOSIS — N183 Chronic kidney disease, stage 3 (moderate): Secondary | ICD-10-CM | POA: Diagnosis not present

## 2016-06-30 DIAGNOSIS — I129 Hypertensive chronic kidney disease with stage 1 through stage 4 chronic kidney disease, or unspecified chronic kidney disease: Secondary | ICD-10-CM | POA: Diagnosis not present

## 2016-07-15 DIAGNOSIS — H25813 Combined forms of age-related cataract, bilateral: Secondary | ICD-10-CM | POA: Diagnosis not present

## 2016-07-15 DIAGNOSIS — E119 Type 2 diabetes mellitus without complications: Secondary | ICD-10-CM | POA: Diagnosis not present

## 2016-07-22 DIAGNOSIS — I129 Hypertensive chronic kidney disease with stage 1 through stage 4 chronic kidney disease, or unspecified chronic kidney disease: Secondary | ICD-10-CM | POA: Diagnosis not present

## 2016-07-22 DIAGNOSIS — N183 Chronic kidney disease, stage 3 (moderate): Secondary | ICD-10-CM | POA: Diagnosis not present

## 2016-07-22 DIAGNOSIS — N08 Glomerular disorders in diseases classified elsewhere: Secondary | ICD-10-CM | POA: Diagnosis not present

## 2016-07-22 DIAGNOSIS — E1122 Type 2 diabetes mellitus with diabetic chronic kidney disease: Secondary | ICD-10-CM | POA: Diagnosis not present

## 2016-07-22 DIAGNOSIS — Z0000 Encounter for general adult medical examination without abnormal findings: Secondary | ICD-10-CM | POA: Diagnosis not present

## 2016-07-22 DIAGNOSIS — E559 Vitamin D deficiency, unspecified: Secondary | ICD-10-CM | POA: Diagnosis not present

## 2016-07-22 DIAGNOSIS — Z1389 Encounter for screening for other disorder: Secondary | ICD-10-CM | POA: Diagnosis not present

## 2016-08-02 DIAGNOSIS — E2839 Other primary ovarian failure: Secondary | ICD-10-CM | POA: Diagnosis not present

## 2016-08-02 DIAGNOSIS — M85851 Other specified disorders of bone density and structure, right thigh: Secondary | ICD-10-CM | POA: Diagnosis not present

## 2016-09-07 DIAGNOSIS — R634 Abnormal weight loss: Secondary | ICD-10-CM | POA: Diagnosis not present

## 2016-09-07 DIAGNOSIS — R197 Diarrhea, unspecified: Secondary | ICD-10-CM | POA: Diagnosis not present

## 2016-09-07 DIAGNOSIS — N183 Chronic kidney disease, stage 3 (moderate): Secondary | ICD-10-CM | POA: Diagnosis not present

## 2016-09-07 DIAGNOSIS — D649 Anemia, unspecified: Secondary | ICD-10-CM | POA: Diagnosis not present

## 2016-09-07 DIAGNOSIS — I129 Hypertensive chronic kidney disease with stage 1 through stage 4 chronic kidney disease, or unspecified chronic kidney disease: Secondary | ICD-10-CM | POA: Diagnosis not present

## 2016-09-23 DIAGNOSIS — R140 Abdominal distension (gaseous): Secondary | ICD-10-CM | POA: Diagnosis not present

## 2016-09-23 DIAGNOSIS — R194 Change in bowel habit: Secondary | ICD-10-CM | POA: Diagnosis not present

## 2016-10-06 DIAGNOSIS — Z23 Encounter for immunization: Secondary | ICD-10-CM | POA: Diagnosis not present

## 2016-10-12 DIAGNOSIS — R140 Abdominal distension (gaseous): Secondary | ICD-10-CM | POA: Diagnosis not present

## 2016-11-17 DIAGNOSIS — N08 Glomerular disorders in diseases classified elsewhere: Secondary | ICD-10-CM | POA: Diagnosis not present

## 2016-11-17 DIAGNOSIS — I129 Hypertensive chronic kidney disease with stage 1 through stage 4 chronic kidney disease, or unspecified chronic kidney disease: Secondary | ICD-10-CM | POA: Diagnosis not present

## 2016-11-17 DIAGNOSIS — N183 Chronic kidney disease, stage 3 (moderate): Secondary | ICD-10-CM | POA: Diagnosis not present

## 2016-11-17 DIAGNOSIS — E1122 Type 2 diabetes mellitus with diabetic chronic kidney disease: Secondary | ICD-10-CM | POA: Diagnosis not present

## 2016-12-09 ENCOUNTER — Other Ambulatory Visit: Payer: Self-pay

## 2016-12-24 DIAGNOSIS — Z1231 Encounter for screening mammogram for malignant neoplasm of breast: Secondary | ICD-10-CM | POA: Diagnosis not present

## 2017-02-09 DIAGNOSIS — L218 Other seborrheic dermatitis: Secondary | ICD-10-CM | POA: Diagnosis not present

## 2017-02-09 DIAGNOSIS — R208 Other disturbances of skin sensation: Secondary | ICD-10-CM | POA: Diagnosis not present

## 2017-02-09 DIAGNOSIS — D234 Other benign neoplasm of skin of scalp and neck: Secondary | ICD-10-CM | POA: Diagnosis not present

## 2017-03-08 DIAGNOSIS — Z1211 Encounter for screening for malignant neoplasm of colon: Secondary | ICD-10-CM | POA: Diagnosis not present

## 2017-03-08 DIAGNOSIS — R140 Abdominal distension (gaseous): Secondary | ICD-10-CM | POA: Diagnosis not present

## 2017-03-15 DIAGNOSIS — I129 Hypertensive chronic kidney disease with stage 1 through stage 4 chronic kidney disease, or unspecified chronic kidney disease: Secondary | ICD-10-CM | POA: Diagnosis not present

## 2017-03-15 DIAGNOSIS — E1122 Type 2 diabetes mellitus with diabetic chronic kidney disease: Secondary | ICD-10-CM | POA: Diagnosis not present

## 2017-03-15 DIAGNOSIS — N08 Glomerular disorders in diseases classified elsewhere: Secondary | ICD-10-CM | POA: Diagnosis not present

## 2017-03-15 DIAGNOSIS — N183 Chronic kidney disease, stage 3 (moderate): Secondary | ICD-10-CM | POA: Diagnosis not present

## 2017-03-23 DIAGNOSIS — Z1211 Encounter for screening for malignant neoplasm of colon: Secondary | ICD-10-CM | POA: Diagnosis not present

## 2017-03-28 DIAGNOSIS — M2669 Other specified disorders of temporomandibular joint: Secondary | ICD-10-CM | POA: Diagnosis not present

## 2017-03-28 DIAGNOSIS — Z972 Presence of dental prosthetic device (complete) (partial): Secondary | ICD-10-CM | POA: Diagnosis not present

## 2017-05-14 IMAGING — CR DG FOOT COMPLETE 3+V*R*
3 series · 3 of 3 positions shown · non-contrast
Comparison: None.

CLINICAL DATA: stubbed right toe [REDACTED] morning; seemed fine for
the next day or two then started having pain in distal 1st
metatarsal x 5 days with redness and swelling gradually appearing

EXAM:
RIGHT FOOT COMPLETE - 3+ VIEW

[t foot ap right]
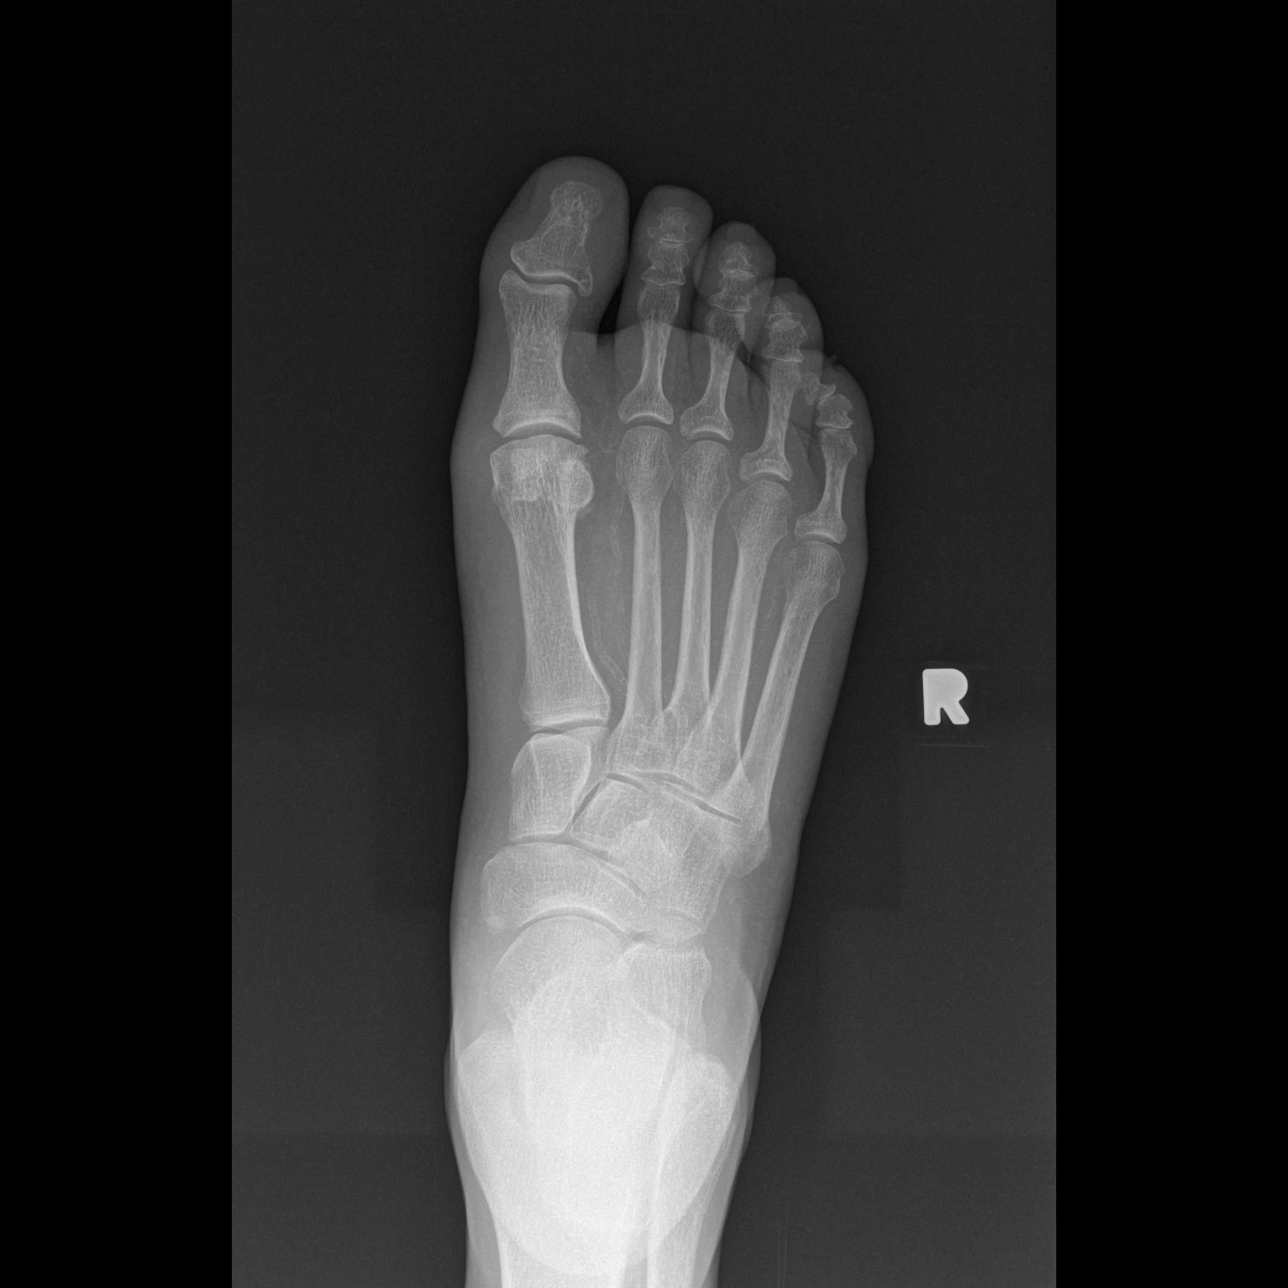

[t foot oblique right]
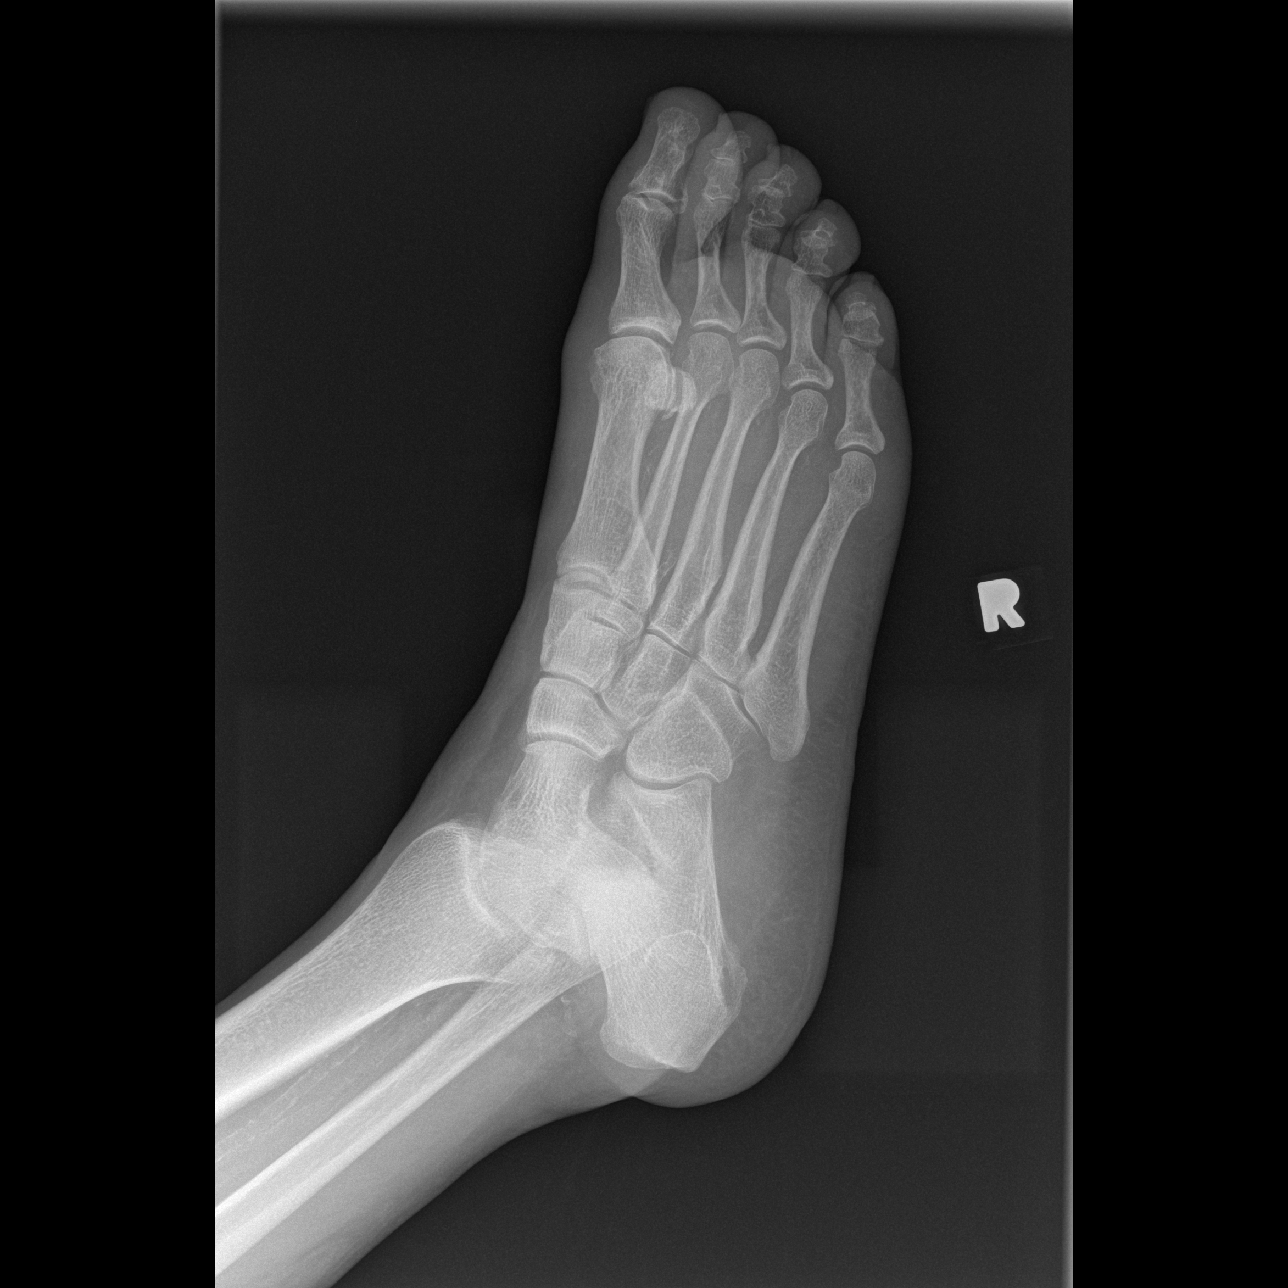

[t foot lat right]
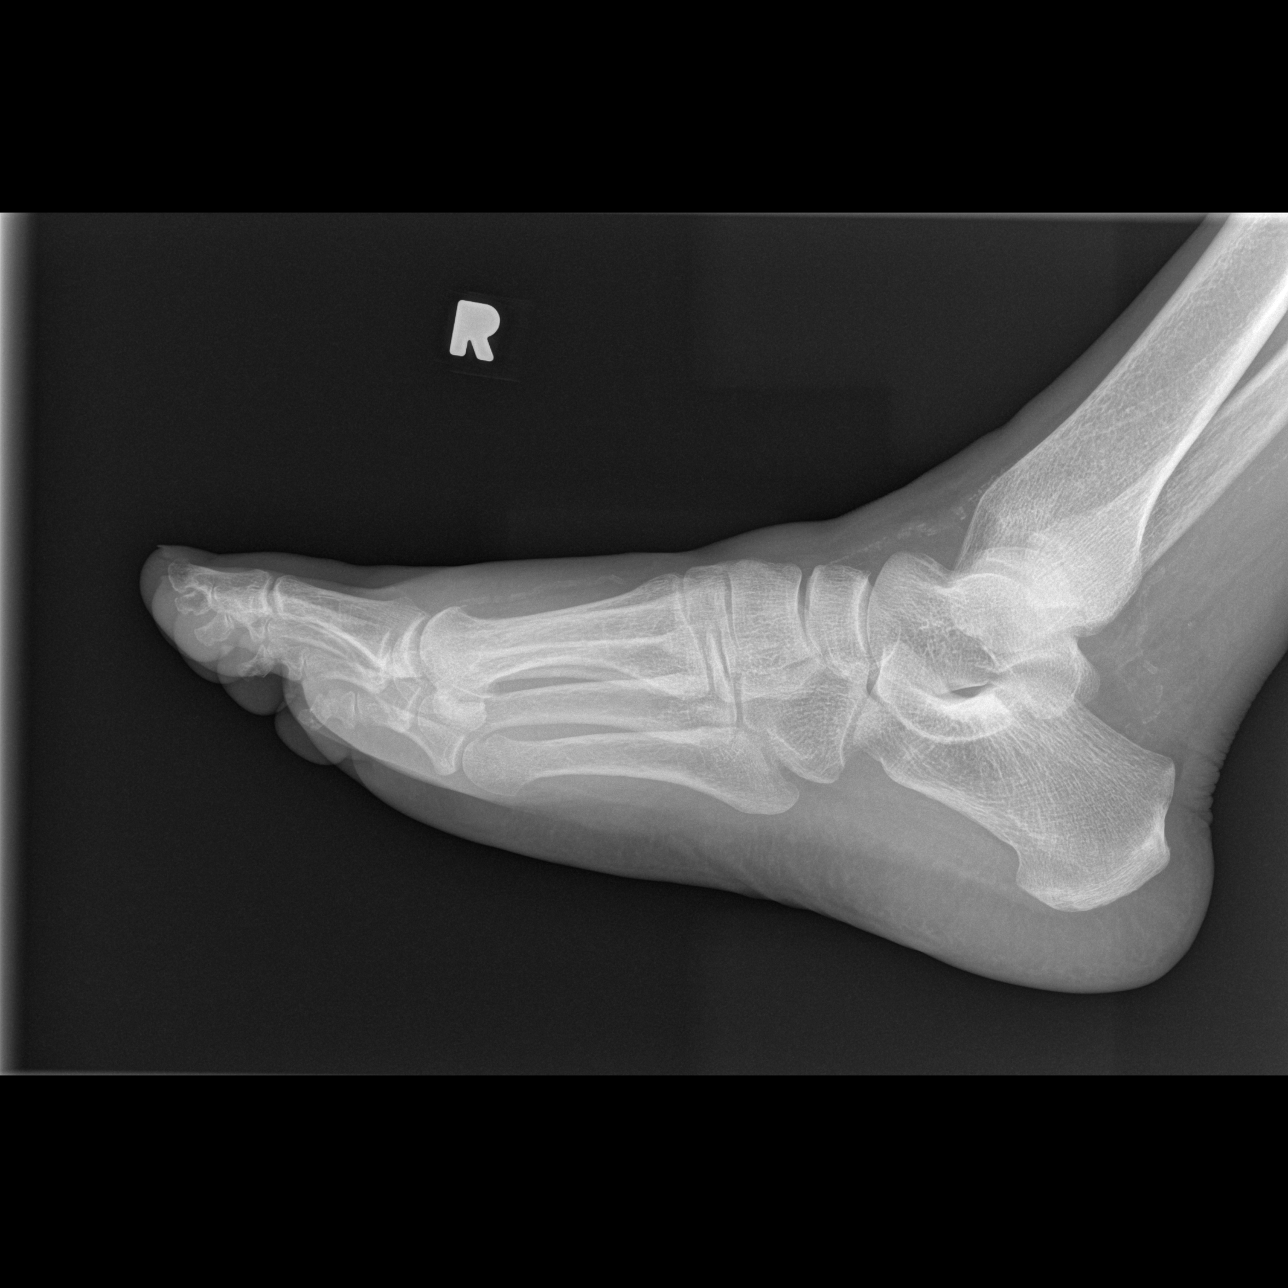

[3 of 3 positions shown; findings below may reference images not displayed]

FINDINGS: There is no evidence of fracture or dislocation. No acute osseous
abnormality. Diffuse small vessel calcification.
IMPRESSION: No acute findings

## 2017-06-20 DIAGNOSIS — E1122 Type 2 diabetes mellitus with diabetic chronic kidney disease: Secondary | ICD-10-CM | POA: Diagnosis not present

## 2017-06-20 DIAGNOSIS — I129 Hypertensive chronic kidney disease with stage 1 through stage 4 chronic kidney disease, or unspecified chronic kidney disease: Secondary | ICD-10-CM | POA: Diagnosis not present

## 2017-06-20 DIAGNOSIS — N183 Chronic kidney disease, stage 3 (moderate): Secondary | ICD-10-CM | POA: Diagnosis not present

## 2017-06-20 DIAGNOSIS — N08 Glomerular disorders in diseases classified elsewhere: Secondary | ICD-10-CM | POA: Diagnosis not present

## 2017-06-20 DIAGNOSIS — Z1389 Encounter for screening for other disorder: Secondary | ICD-10-CM | POA: Diagnosis not present

## 2017-07-13 DIAGNOSIS — E119 Type 2 diabetes mellitus without complications: Secondary | ICD-10-CM | POA: Diagnosis not present

## 2017-07-13 DIAGNOSIS — H25813 Combined forms of age-related cataract, bilateral: Secondary | ICD-10-CM | POA: Diagnosis not present

## 2017-09-15 DIAGNOSIS — I129 Hypertensive chronic kidney disease with stage 1 through stage 4 chronic kidney disease, or unspecified chronic kidney disease: Secondary | ICD-10-CM | POA: Diagnosis not present

## 2017-09-15 DIAGNOSIS — D631 Anemia in chronic kidney disease: Secondary | ICD-10-CM | POA: Diagnosis not present

## 2017-09-15 DIAGNOSIS — E7800 Pure hypercholesterolemia, unspecified: Secondary | ICD-10-CM | POA: Diagnosis not present

## 2017-09-15 DIAGNOSIS — E1122 Type 2 diabetes mellitus with diabetic chronic kidney disease: Secondary | ICD-10-CM | POA: Diagnosis not present

## 2017-09-15 DIAGNOSIS — N183 Chronic kidney disease, stage 3 (moderate): Secondary | ICD-10-CM | POA: Diagnosis not present

## 2017-09-15 DIAGNOSIS — Z23 Encounter for immunization: Secondary | ICD-10-CM | POA: Diagnosis not present

## 2017-09-15 DIAGNOSIS — E1129 Type 2 diabetes mellitus with other diabetic kidney complication: Secondary | ICD-10-CM | POA: Diagnosis not present

## 2017-09-21 ENCOUNTER — Other Ambulatory Visit (HOSPITAL_COMMUNITY): Payer: Self-pay

## 2017-09-22 ENCOUNTER — Encounter (HOSPITAL_COMMUNITY)
Admission: RE | Admit: 2017-09-22 | Discharge: 2017-09-22 | Disposition: A | Discharge status: Home / Self Care | Payer: Medicare Other | Source: Ambulatory Visit | Attending: Nephrology | Admitting: Nephrology

## 2017-09-22 DIAGNOSIS — N189 Chronic kidney disease, unspecified: Secondary | ICD-10-CM | POA: Insufficient documentation

## 2017-09-22 DIAGNOSIS — D631 Anemia in chronic kidney disease: Secondary | ICD-10-CM | POA: Diagnosis not present

## 2017-09-22 MED ORDER — SODIUM CHLORIDE 0.9 % IV SOLN
510.0000 mg | INTRAVENOUS | Status: DC
  Administered 2017-09-22: 510 mg via INTRAVENOUS
  Filled 2017-09-22: qty 17

## 2017-09-22 NOTE — Discharge Instructions (Signed)

## 2017-09-28 ENCOUNTER — Other Ambulatory Visit (HOSPITAL_COMMUNITY): Payer: Self-pay | Admitting: *Deleted

## 2017-09-29 ENCOUNTER — Ambulatory Visit (HOSPITAL_COMMUNITY)
Admission: RE | Admit: 2017-09-29 | Discharge: 2017-09-29 | Disposition: A | Discharge status: Home / Self Care | Payer: Medicare Other | Source: Ambulatory Visit | Attending: Nephrology | Admitting: Nephrology

## 2017-09-29 DIAGNOSIS — I129 Hypertensive chronic kidney disease with stage 1 through stage 4 chronic kidney disease, or unspecified chronic kidney disease: Secondary | ICD-10-CM | POA: Diagnosis not present

## 2017-09-29 DIAGNOSIS — E1122 Type 2 diabetes mellitus with diabetic chronic kidney disease: Secondary | ICD-10-CM | POA: Diagnosis not present

## 2017-09-29 DIAGNOSIS — D631 Anemia in chronic kidney disease: Secondary | ICD-10-CM | POA: Insufficient documentation

## 2017-09-29 DIAGNOSIS — N183 Chronic kidney disease, stage 3 (moderate): Secondary | ICD-10-CM | POA: Diagnosis not present

## 2017-09-29 DIAGNOSIS — N189 Chronic kidney disease, unspecified: Secondary | ICD-10-CM | POA: Insufficient documentation

## 2017-09-29 DIAGNOSIS — N08 Glomerular disorders in diseases classified elsewhere: Secondary | ICD-10-CM | POA: Diagnosis not present

## 2017-09-29 MED ORDER — SODIUM CHLORIDE 0.9 % IV SOLN
510.0000 mg | INTRAVENOUS | Status: DC
  Administered 2017-09-29: 510 mg via INTRAVENOUS
  Filled 2017-09-29: qty 17

## 2017-10-03 DIAGNOSIS — Z23 Encounter for immunization: Secondary | ICD-10-CM | POA: Diagnosis not present

## 2017-10-11 ENCOUNTER — Other Ambulatory Visit: Payer: Self-pay | Admitting: Internal Medicine

## 2017-10-11 ENCOUNTER — Other Ambulatory Visit: Payer: Self-pay | Admitting: Nurse Practitioner

## 2017-11-11 ENCOUNTER — Other Ambulatory Visit: Payer: Self-pay

## 2017-11-11 ENCOUNTER — Emergency Department (HOSPITAL_BASED_OUTPATIENT_CLINIC_OR_DEPARTMENT_OTHER): Payer: Medicare Other

## 2017-11-11 ENCOUNTER — Emergency Department (HOSPITAL_BASED_OUTPATIENT_CLINIC_OR_DEPARTMENT_OTHER)
Admission: EM | Admit: 2017-11-11 | Discharge: 2017-11-11 | Disposition: A | Discharge status: Home / Self Care | Payer: Medicare Other | Attending: Emergency Medicine | Admitting: Emergency Medicine

## 2017-11-11 ENCOUNTER — Encounter (HOSPITAL_BASED_OUTPATIENT_CLINIC_OR_DEPARTMENT_OTHER): Payer: Self-pay | Admitting: *Deleted

## 2017-11-11 DIAGNOSIS — S0993XA Unspecified injury of face, initial encounter: Secondary | ICD-10-CM | POA: Diagnosis not present

## 2017-11-11 DIAGNOSIS — Z79899 Other long term (current) drug therapy: Secondary | ICD-10-CM | POA: Insufficient documentation

## 2017-11-11 DIAGNOSIS — Y9301 Activity, walking, marching and hiking: Secondary | ICD-10-CM | POA: Insufficient documentation

## 2017-11-11 DIAGNOSIS — Y929 Unspecified place or not applicable: Secondary | ICD-10-CM | POA: Insufficient documentation

## 2017-11-11 DIAGNOSIS — W0110XA Fall on same level from slipping, tripping and stumbling with subsequent striking against unspecified object, initial encounter: Secondary | ICD-10-CM | POA: Insufficient documentation

## 2017-11-11 DIAGNOSIS — I10 Essential (primary) hypertension: Secondary | ICD-10-CM | POA: Insufficient documentation

## 2017-11-11 DIAGNOSIS — Z7984 Long term (current) use of oral hypoglycemic drugs: Secondary | ICD-10-CM | POA: Insufficient documentation

## 2017-11-11 DIAGNOSIS — S0083XA Contusion of other part of head, initial encounter: Secondary | ICD-10-CM | POA: Diagnosis not present

## 2017-11-11 DIAGNOSIS — Y999 Unspecified external cause status: Secondary | ICD-10-CM | POA: Diagnosis not present

## 2017-11-11 DIAGNOSIS — W19XXXA Unspecified fall, initial encounter: Secondary | ICD-10-CM

## 2017-11-11 DIAGNOSIS — E119 Type 2 diabetes mellitus without complications: Secondary | ICD-10-CM | POA: Insufficient documentation

## 2017-11-11 NOTE — ED Notes (Signed)
Patient transported to CT 

## 2017-11-11 NOTE — ED Triage Notes (Signed)
Pt fell on concrete x 8 hrs ago injuring left side of face, abrasion to cheek and swelling noted

## 2017-11-11 NOTE — ED Provider Notes (Signed)
[facility] EMERGENCY DEPARTMENT Provider Note   CSN: [id number] Arrival date & time: 11/11/17  2030     History   Chief Complaint Chief Complaint  Patient presents with  . Fall    HPI Holly Peters is a 67 y.o. female.  HPI Patient presents after a fall.  At around 8 hours prior to arrival patient stepped on a rock and fell, striking the left side of her face.  States she was down in [place].  No loss conscious.  Also landed on her hands and knees.  States some abrasions to hands and knees but otherwise they are feeling fine.  Does have swelling on her face however.  No vision changes. Past Medical History:  Diagnosis Date  . Diabetes mellitus without complication ([place])   . Hyperlipemia   . Hypertension     There are no active problems to display for this patient.   Past Surgical History:  Procedure Laterality Date  . ABDOMINAL HYSTERECTOMY       OB History   None      Home Medications    Prior to Admission medications   Medication Sig Start Date End Date Taking? Authorizing Provider  alendronate (FOSAMAX) 70 MG tablet Take 70 mg by mouth once a week. Take with a full glass of water on an empty stomach.    [provider]  diltiazem (TIAZAC) 240 MG 24 hr capsule Take 240 mg by mouth daily.    [provider]  HYDROcodone-acetaminophen (NORCO/VICODIN) 5-325 MG per tablet Take 2 tablets by mouth every 4 (four) hours as needed for pain. 07/31/12   Fransico Meadow, PA-C  MICARDIS HCT 40-12.5 MG tablet TAKE 1 TABLET DAILY 10/11/17   Glendale Chard, MD  simvastatin (ZOCOR) 20 MG tablet TAKE 1 TABLET DAILY 10/11/17   Glendale Chard, MD  sitaGLIPtan-metformin (JANUMET) 50-500 MG per tablet Take 1 tablet by mouth 2 (two) times daily with a meal.    [provider]  Vitamin D, Ergocalciferol, (DRISDOL) 50000 units CAPS capsule Take 50,000 Units by mouth every 3 (three) days.    [provider]    Family History No  family history on file.  Social History Social History   Tobacco Use  . Smoking status: Never Smoker  Substance Use Topics  . Alcohol use: No  . Drug use: No     Allergies   Patient has no known allergies.   Review of Systems Review of Systems  Constitutional: Negative for fever.  HENT: Negative for congestion and trouble swallowing.   Respiratory: Negative for shortness of breath.   Cardiovascular: Negative for chest pain.  Gastrointestinal: Negative for abdominal pain.  Genitourinary: Negative for flank pain.  Musculoskeletal: Negative for back pain and neck pain.  Skin: Positive for wound.  Neurological: Negative for weakness.  Psychiatric/Behavioral: Negative for confusion.     Physical Exam Updated Vital Signs BP (!) 167/81 (BP Location: Left Arm)   Pulse 95   Temp 98.4 F (36.9 C) (Oral)   Resp 18   Ht 5\' 1"  (1.549 m)   Wt 61.2 kg   SpO2 100%   BMI 25.51 kg/m   Physical Exam  Constitutional: She appears well-developed.  HENT:   now swelling and some tenderness over left zygoma.  Eye was intact but does have some periorbital swelling.  Pupils reactive.  Abrasion on left orbital ridge area.  No septal hematoma.  Eyes: Pupils are equal, round, and reactive to light.  Neck: Neck  supple.  Cardiovascular: Normal rate.  Pulmonary/Chest: Effort normal.  Abdominal: There is no tenderness. There is no guarding.  Musculoskeletal: She exhibits no tenderness.  Abrasions to bilateral palms without underlying tendon bone tenderness.  No tenderness over wrist elbows or shoulders.  Neurological: She is alert.  Skin: Skin is warm. Capillary refill takes less than 2 seconds.     ED Treatments / Results  Labs (all labs ordered are listed, but only abnormal results are displayed) Labs Reviewed - No data to display  EKG None  Radiology Ct Maxillofacial Wo Contrast  Result Date: 11/11/2017 CLINICAL DATA:  Facial trauma, the patient tripped on a rockand struck  the face. EXAM: CT MAXILLOFACIAL WITHOUT CONTRAST TECHNIQUE: Multidetector CT imaging of the maxillofacial structures was performed. Multiplanar CT image reconstructions were also generated. COMPARISON:  None. FINDINGS: Osseous: No facial fracture is identified. There is notable and prominent degeneration at the right TMJ with loss of articular space, spurring, probably some volume loss in the mandibular condyle, and fragmented spurring which appears to be chronic. There is evidence of cervical spondylosis and degenerative disc disease with loss of disc height at C5-6 and C6-7, and notable facet spurring at C4-5 where there is about 2.5 mm of anterolisthesis. This is probably degenerative anterolisthesis but this level is only partially included on today's maxillofacial CT which was not optimized to assess the cervical spine. No discrete fracture the visualized part of the cervical spine is evident. Orbits: No significant intraorbital abnormality. Sinuses: Unremarkable Soft tissues: Left infraorbital abnormal soft tissue swelling/bruising as shown on image 35/2. There is potentially soft tissue swelling along the chin. Limited intracranial: Unremarkable IMPRESSION: 1. Left facial bruising/soft tissue swelling but no acute facial fracture. 2. Severe right TMJ arthropathy. There appears to be some chronically fragmented spurring anteriorly along the TMJ region. 3. Cervical spondylosis and degenerative disc disease. There is about 2.5 mm of anterolisthesis at C4-5 which is most likely to be degenerative given the partially visualized facet spurring at this level. I do not see any prevertebral soft tissue swelling along the visualized portion of the upper cervical spine. Electronically Signed   By: Van Clines M.D.   On: 11/11/2017 21:04    Procedures Procedures (including critical care time)  Medications Ordered in ED Medications - No data to display   Initial Impression / Assessment and Plan / ED  Course  I have reviewed the triage vital signs and the nursing notes.  Pertinent labs & imaging results that were available during my care of the patient were reviewed by me and considered in my medical decision making (see chart for details).     Patient with fall.  Facial contusion.  CT scan done and reassuring.  Has some chronic changes but no acute bony injury.  Discharge home.  Final Clinical Impressions(s) / ED Diagnoses   Final diagnoses:  Contusion of face, initial encounter  Fall, initial encounter    ED Discharge Orders    None       Davonna Belling, MD 11/11/17 2125

## 2017-11-14 ENCOUNTER — Emergency Department (HOSPITAL_BASED_OUTPATIENT_CLINIC_OR_DEPARTMENT_OTHER): Payer: Medicare Other

## 2017-11-14 ENCOUNTER — Emergency Department (HOSPITAL_BASED_OUTPATIENT_CLINIC_OR_DEPARTMENT_OTHER)
Admission: EM | Admit: 2017-11-14 | Discharge: 2017-11-14 | Disposition: A | Discharge status: Home / Self Care | Payer: Medicare Other | Attending: Emergency Medicine | Admitting: Emergency Medicine

## 2017-11-14 ENCOUNTER — Other Ambulatory Visit: Payer: Self-pay

## 2017-11-14 ENCOUNTER — Encounter (HOSPITAL_BASED_OUTPATIENT_CLINIC_OR_DEPARTMENT_OTHER): Payer: Self-pay | Admitting: *Deleted

## 2017-11-14 DIAGNOSIS — Y998 Other external cause status: Secondary | ICD-10-CM | POA: Diagnosis not present

## 2017-11-14 DIAGNOSIS — Z79899 Other long term (current) drug therapy: Secondary | ICD-10-CM | POA: Diagnosis not present

## 2017-11-14 DIAGNOSIS — S93402A Sprain of unspecified ligament of left ankle, initial encounter: Secondary | ICD-10-CM | POA: Insufficient documentation

## 2017-11-14 DIAGNOSIS — W010XXA Fall on same level from slipping, tripping and stumbling without subsequent striking against object, initial encounter: Secondary | ICD-10-CM | POA: Diagnosis not present

## 2017-11-14 DIAGNOSIS — Z7984 Long term (current) use of oral hypoglycemic drugs: Secondary | ICD-10-CM | POA: Insufficient documentation

## 2017-11-14 DIAGNOSIS — S99912A Unspecified injury of left ankle, initial encounter: Secondary | ICD-10-CM | POA: Diagnosis not present

## 2017-11-14 DIAGNOSIS — Y929 Unspecified place or not applicable: Secondary | ICD-10-CM | POA: Diagnosis not present

## 2017-11-14 DIAGNOSIS — I10 Essential (primary) hypertension: Secondary | ICD-10-CM | POA: Insufficient documentation

## 2017-11-14 DIAGNOSIS — M7989 Other specified soft tissue disorders: Secondary | ICD-10-CM | POA: Diagnosis not present

## 2017-11-14 DIAGNOSIS — E119 Type 2 diabetes mellitus without complications: Secondary | ICD-10-CM | POA: Insufficient documentation

## 2017-11-14 DIAGNOSIS — Y9389 Activity, other specified: Secondary | ICD-10-CM | POA: Diagnosis not present

## 2017-11-14 NOTE — ED Provider Notes (Signed)
[facility] EMERGENCY DEPARTMENT Provider Note   CSN: [id number] Arrival date & time: 11/14/17  1415     History   Chief Complaint Chief Complaint  Patient presents with  . Fall    HPI Holly Peters is a 67 y.o. female.  HPI 67 year old female presents the emergency department with complaints of left ankle pain.  She fell 3 days ago.  She was seen and evaluated the emergency department.  At that time she did not tell them about her left ankle pain.  Through the weekend she has had pain with ambulation on the lateral and across the top of her left ankle.  No other complaints.  Tried ibuprofen with some improvement.  She states that the day is gone on the left ankle pain has somewhat decreased.   Past Medical History:  Diagnosis Date  . Diabetes mellitus without complication ([place])   . Hyperlipemia   . Hypertension     There are no active problems to display for this patient.   Past Surgical History:  Procedure Laterality Date  . ABDOMINAL HYSTERECTOMY       OB History   None      Home Medications    Prior to Admission medications   Medication Sig Start Date End Date Taking? Authorizing Provider  ibuprofen (ADVIL,MOTRIN) 400 MG tablet Take 400 mg by mouth every 6 (six) hours as needed.   Yes [provider]  alendronate (FOSAMAX) 70 MG tablet Take 70 mg by mouth once a week. Take with a full glass of water on an empty stomach.    [provider]  diltiazem (TIAZAC) 240 MG 24 hr capsule Take 240 mg by mouth daily.    [provider]  HYDROcodone-acetaminophen (NORCO/VICODIN) 5-325 MG per tablet Take 2 tablets by mouth every 4 (four) hours as needed for pain. 07/31/12   Fransico Meadow, PA-C  MICARDIS HCT 40-12.5 MG tablet TAKE 1 TABLET DAILY 10/11/17   Glendale Chard, MD  simvastatin (ZOCOR) 20 MG tablet TAKE 1 TABLET DAILY 10/11/17   Glendale Chard, MD  sitaGLIPtan-metformin (JANUMET) 50-500 MG per tablet Take 1 tablet by  mouth 2 (two) times daily with a meal.    [provider]  Vitamin D, Ergocalciferol, (DRISDOL) 50000 units CAPS capsule Take 50,000 Units by mouth every 3 (three) days.    [provider]    Family History History reviewed. No pertinent family history.  Social History Social History   Tobacco Use  . Smoking status: Never Smoker  Substance Use Topics  . Alcohol use: No  . Drug use: No     Allergies   Patient has no known allergies.   Review of Systems Review of Systems  All other systems reviewed and are negative.    Physical Exam Updated Vital Signs BP (!) 145/75   Pulse 85   Temp 98.5 F (36.9 C)   Resp 18   Physical Exam  Constitutional: She is oriented to person, place, and time. She appears well-developed and well-nourished.  HENT:  Head: Normocephalic.  Eyes: EOM are normal.  Neck: Normal range of motion.  Pulmonary/Chest: Effort normal.  Abdominal: She exhibits no distension.  Musculoskeletal: Normal range of motion.  Mild swelling of the left lateral malleolus.  No obvious deformity of left ankle.  Normal PT and DP pulses in the left foot.  Full range of motion of left hip and left knee.  Compartments left foot and left lower extremity are soft  Neurological:  She is alert and oriented to person, place, and time.  Psychiatric: She has a normal mood and affect.  Nursing note and vitals reviewed.    ED Treatments / Results  Labs (all labs ordered are listed, but only abnormal results are displayed) Labs Reviewed - No data to display  EKG None  Radiology Dg Ankle Complete Left  Result Date: 11/14/2017 CLINICAL DATA:  Patient reports falling 3 days ago and has persistent left ankle pain. EXAM: LEFT ANKLE COMPLETE - 3+ VIEW COMPARISON:  None. FINDINGS: The bones are subjectively adequately mineralized. There is no acute fracture nor dislocation. The ankle joint mortise is preserved. The talar dome is intact. There is an Achilles  region calcaneal spur. There is mild soft tissue swelling diffusely. There are arterial calcifications. IMPRESSION: There is no acute fracture nor dislocation of the left ankle. Mild soft tissue swelling. Electronically Signed   By: David  Martinique M.D.   On: 11/14/2017 15:08    Procedures Procedures (including critical care time)  Medications Ordered in ED Medications - No data to display   Initial Impression / Assessment and Plan / ED Course  I have reviewed the triage vital signs and the nursing notes.  Pertinent labs & imaging results that were available during my care of the patient were reviewed by me and considered in my medical decision making (see chart for details).    Left ankle sprain.  X-ray negative.  X-ray personally reviewed by myself and demonstrates no acute osseous injury.  Final Clinical Impressions(s) / ED Diagnoses   Final diagnoses:  Sprain of left ankle, unspecified ligament, initial encounter    ED Discharge Orders    None       Jola Schmidt, MD 11/14/17 1539

## 2017-11-14 NOTE — ED Triage Notes (Signed)
Pt seen here yesterday for same DX fall , today c/o left ankle pain

## 2017-11-23 ENCOUNTER — Other Ambulatory Visit: Payer: Self-pay

## 2018-01-18 DIAGNOSIS — Z1231 Encounter for screening mammogram for malignant neoplasm of breast: Secondary | ICD-10-CM | POA: Diagnosis not present

## 2018-01-30 ENCOUNTER — Other Ambulatory Visit: Payer: Self-pay | Admitting: Internal Medicine

## 2018-01-31 ENCOUNTER — Encounter: Payer: Self-pay | Admitting: Internal Medicine

## 2018-01-31 ENCOUNTER — Ambulatory Visit (INDEPENDENT_AMBULATORY_CARE_PROVIDER_SITE_OTHER): Payer: Medicare Other | Admitting: Internal Medicine

## 2018-01-31 VITALS — BP 136/82 | HR 78 | Temp 98.3°F | Ht 60.4 in | Wt 133.4 lb

## 2018-01-31 DIAGNOSIS — E1122 Type 2 diabetes mellitus with diabetic chronic kidney disease: Secondary | ICD-10-CM | POA: Diagnosis not present

## 2018-01-31 DIAGNOSIS — E7800 Pure hypercholesterolemia, unspecified: Secondary | ICD-10-CM

## 2018-01-31 DIAGNOSIS — Z23 Encounter for immunization: Secondary | ICD-10-CM | POA: Diagnosis not present

## 2018-01-31 DIAGNOSIS — N183 Chronic kidney disease, stage 3 unspecified: Secondary | ICD-10-CM

## 2018-01-31 DIAGNOSIS — I129 Hypertensive chronic kidney disease with stage 1 through stage 4 chronic kidney disease, or unspecified chronic kidney disease: Secondary | ICD-10-CM | POA: Insufficient documentation

## 2018-01-31 MED ORDER — ZOSTER VAC RECOMB ADJUVANTED 50 MCG/0.5ML IM SUSR: 0.5000 mL | Freq: Once | INTRAMUSCULAR | 0 refills | Status: DC

## 2018-01-31 MED ORDER — ZOSTER VAC RECOMB ADJUVANTED 50 MCG/0.5ML IM SUSR: 0.5000 mL | Freq: Once | INTRAMUSCULAR | 1 refills | Status: AC

## 2018-01-31 NOTE — Progress Notes (Signed)
Subjective:     Patient ID: Holly Peters , female    DOB: December 31, 1950 , 68 y.o.   MRN: [id number]   Chief Complaint  Patient presents with  . Diabetes  . Hypertension    HPI  Diabetes  She presents for her follow-up diabetic visit. She has type 2 diabetes mellitus. Her disease course has been stable. There are no hypoglycemic associated symptoms. Pertinent negatives for diabetes include no blurred vision and no chest pain. There are no hypoglycemic complications. Risk factors for coronary artery disease include diabetes mellitus, dyslipidemia, hypertension, sedentary lifestyle and post-menopausal. Her weight is stable. She is following a diabetic diet. Her breakfast blood glucose is taken between 8-9 am. Her breakfast blood glucose range is generally 90-110 mg/dl. Eye exam is current.  Hypertension  This is a chronic problem. The current episode started more than 1 year ago. The problem has been gradually improving since onset. The problem is controlled. Pertinent negatives include no blurred vision or chest pain.   She reports compliance with meds.  Past Medical History:  Diagnosis Date  . Diabetes mellitus without complication ([place])   . Hyperlipemia   . Hypertension      Family History  Problem Relation Age of Onset  . Diabetes Mother   . Healthy Father      Current Outpatient Medications:  .  alendronate (FOSAMAX) 70 MG tablet, Take 70 mg by mouth once a week. Take with a full glass of water on an empty stomach., Disp: , Rfl:  .  Cholecalciferol (VITAMIN D) 125 MCG (5000 UT) CAPS, Take by mouth., Disp: , Rfl:  .  diltiazem (TIAZAC) 240 MG 24 hr capsule, Take 240 mg by mouth daily., Disp: , Rfl:  .  MICARDIS HCT 40-12.5 MG tablet, TAKE 1 TABLET DAILY, Disp: 90 tablet, Rfl: 4 .  simvastatin (ZOCOR) 20 MG tablet, TAKE 1 TABLET DAILY, Disp: 90 tablet, Rfl: 4 .  sitaGLIPtan-metformin (JANUMET) 50-500 MG per tablet, Take 1 tablet by mouth 2 (two) times daily with a meal.,  Disp: , Rfl:  .  ibuprofen (ADVIL,MOTRIN) 400 MG tablet, Take 400 mg by mouth every 6 (six) hours as needed., Disp: , Rfl:  .  Zoster Vaccine Adjuvanted [facility]) injection, Inject 0.5 mLs into the muscle once for 1 dose., Disp: 0.5 mL, Rfl: 1   No Known Allergies   Review of Systems  Constitutional: Negative.   Eyes: Negative for blurred vision.  Respiratory: Negative.   Cardiovascular: Negative.  Negative for chest pain.  Gastrointestinal: Negative.   Neurological: Negative.   Psychiatric/Behavioral: Negative.      Today's Vitals   01/31/18 0837  BP: 136/82  Pulse: 78  Temp: 98.3 F (36.8 C)  TempSrc: Oral  Weight: 133 lb 6.4 oz (60.5 kg)  Height: 5' 0.4" (1.534 m)  PainSc: 0-No pain   Body mass index is 25.71 kg/m.   Objective:  Physical Exam Vitals signs and nursing note reviewed.  Constitutional:      Appearance: Normal appearance.  HENT:     Head: Normocephalic and atraumatic.  Cardiovascular:     Rate and Rhythm: Normal rate and regular rhythm.     Heart sounds: Normal heart sounds.  Pulmonary:     Effort: Pulmonary effort is normal.     Breath sounds: Normal breath sounds.  Skin:    General: Skin is warm.  Neurological:     General: No focal deficit present.     Mental Status: She is alert.  Psychiatric:  Mood and Affect: Mood normal.         Assessment And Plan:     1. Type 2 diabetes mellitus with stage 3 chronic kidney disease, without long-term current use of insulin ([place])  I will check labs as listed below. She is encouraged to incorporate more exercise into her daily routine. She is encouraged to start with 30 minutes three days weekly and work up to 5-6 days per week for at least 30 minutes. She will rto in 4 months for re-evaluation.  - Hemoglobin A1c - CMP14+EGFR - Lipid panel - Phosphorus  2. Hypertensive nephropathy  Fair control. She will continue with current meds for now. She is encouraged to avoid adding salt to her  foods.   3. Pure hypercholesterolemia  I will check fasting lipid panel. She is encouraged to limit her fried food intake.   4. Need for vaccination  Rx for Shingrix was sent to the pharmacy. Pt advised that she will get one injection, and then get second one 4-6 months later.   Maximino Greenland, MD

## 2018-01-31 NOTE — Patient Instructions (Signed)
Diabetes Mellitus and Exercise Exercising regularly is important for your overall health, especially when you have diabetes (diabetes mellitus). Exercising is not only about losing weight. It has many other health benefits, such as increasing muscle strength and bone density and reducing body fat and stress. This leads to improved fitness, flexibility, and endurance, all of which result in better overall health. Exercise has additional benefits for people with diabetes, including:  Reducing appetite.  Helping to lower and control blood glucose.  Lowering blood pressure.  Helping to control amounts of fatty substances (lipids) in the blood, such as cholesterol and triglycerides.  Helping the body to respond better to insulin (improving insulin sensitivity).  Reducing how much insulin the body needs.  Decreasing the risk for heart disease by: ? Lowering cholesterol and triglyceride levels. ? Increasing the levels of good cholesterol. ? Lowering blood glucose levels. What is my activity plan? Your health care provider or certified diabetes educator can help you make a plan for the type and frequency of exercise (activity plan) that works for you. Make sure that you:  Do at least 150 minutes of moderate-intensity or vigorous-intensity exercise each week. This could be brisk walking, biking, or water aerobics. ? Do stretching and strength exercises, such as yoga or weightlifting, at least 2 times a week. ? Spread out your activity over at least 3 days of the week.  Get some form of physical activity every day. ? Do not go more than 2 days in a row without some kind of physical activity. ? Avoid being inactive for more than 30 minutes at a time. Take frequent breaks to walk or stretch.  Choose a type of exercise or activity that you enjoy, and set realistic goals.  Start slowly, and gradually increase the intensity of your exercise over time. What do I need to know about managing my  diabetes?   Check your blood glucose before and after exercising. ? If your blood glucose is 240 mg/dL (13.3 mmol/L) or higher before you exercise, check your urine for ketones. If you have ketones in your urine, do not exercise until your blood glucose returns to normal. ? If your blood glucose is 100 mg/dL (5.6 mmol/L) or lower, eat a snack containing 15-20 grams of carbohydrate. Check your blood glucose 15 minutes after the snack to make sure that your level is above 100 mg/dL (5.6 mmol/L) before you start your exercise.  Know the symptoms of low blood glucose (hypoglycemia) and how to treat it. Your risk for hypoglycemia increases during and after exercise. Common symptoms of hypoglycemia can include: ? Hunger. ? Anxiety. ? Sweating and feeling clammy. ? Confusion. ? Dizziness or feeling light-headed. ? Increased heart rate or palpitations. ? Blurry vision. ? Tingling or numbness around the mouth, lips, or tongue. ? Tremors or shakes. ? Irritability.  Keep a rapid-acting carbohydrate snack available before, during, and after exercise to help prevent or treat hypoglycemia.  Avoid injecting insulin into areas of the body that are going to be exercised. For example, avoid injecting insulin into: ? The arms, when playing tennis. ? The legs, when jogging.  Keep records of your exercise habits. Doing this can help you and your health care provider adjust your diabetes management plan as needed. Write down: ? Food that you eat before and after you exercise. ? Blood glucose levels before and after you exercise. ? The type and amount of exercise you have done. ? When your insulin is expected to peak, if you use   insulin. Avoid exercising at times when your insulin is peaking.  When you start a new exercise or activity, work with your health care provider to make sure the activity is safe for you, and to adjust your insulin, medicines, or food intake as needed.  Drink plenty of water while  you exercise to prevent dehydration or heat stroke. Drink enough fluid to keep your urine clear or pale yellow. Summary  Exercising regularly is important for your overall health, especially when you have diabetes (diabetes mellitus).  Exercising has many health benefits, such as increasing muscle strength and bone density and reducing body fat and stress.  Your health care provider or certified diabetes educator can help you make a plan for the type and frequency of exercise (activity plan) that works for you.  When you start a new exercise or activity, work with your health care provider to make sure the activity is safe for you, and to adjust your insulin, medicines, or food intake as needed. This information is not intended to replace advice given to you by your health care provider. Make sure you discuss any questions you have with your health care provider. Document Released: 03/13/2003 Document Revised: 07/01/2016 Document Reviewed: 06/02/2015 Elsevier Interactive Patient Education  2019 Else[employer] Inc[employer]  

## 2018-02-01 LAB — HEMOGLOBIN A1C
Est. average glucose Bld gHb Est-mCnc: 120 mg/dL
Hgb A1c MFr Bld: 5.8 % — ABNORMAL HIGH (ref 4.8–5.6)

## 2018-02-01 LAB — LIPID PANEL
Chol/HDL Ratio: 2.3 ratio (ref 0.0–4.4)
Cholesterol, Total: 143 mg/dL (ref 100–199)
HDL: 63 mg/dL (ref >39)
LDL Calculated: 48 mg/dL (ref 0–99)
Triglycerides: 159 mg/dL — ABNORMAL HIGH (ref 0–149)
VLDL Cholesterol Cal: 32 mg/dL (ref 5–40)

## 2018-02-01 LAB — CMP14+EGFR
ALT: 18 IU/L (ref 0–32)
AST: 17 IU/L (ref 0–40)
Albumin/Globulin Ratio: 1.8 (ref 1.2–2.2)
Albumin: 4.8 g/dL (ref 3.8–4.8)
Alkaline Phosphatase: 47 IU/L (ref 39–117)
BUN/Creatinine Ratio: 13 (ref 12–28)
BUN: 25 mg/dL (ref 8–27)
Bilirubin Total: 0.2 mg/dL (ref 0.0–1.2)
CO2: 20 mmol/L (ref 20–29)
Calcium: 9.9 mg/dL (ref 8.7–10.3)
Chloride: 106 mmol/L (ref 96–106)
Creatinine, Ser: 1.92 mg/dL — ABNORMAL HIGH (ref 0.57–1.00)
GFR calc Af Amer: 30 mL/min/{1.73_m2} — ABNORMAL LOW (ref >59)
GFR calc non Af Amer: 26 mL/min/{1.73_m2} — ABNORMAL LOW (ref >59)
Globulin, Total: 2.6 g/dL (ref 1.5–4.5)
Glucose: 89 mg/dL (ref 65–99)
Potassium: 4.4 mmol/L (ref 3.5–5.2)
Sodium: 144 mmol/L (ref 134–144)
Total Protein: 7.4 g/dL (ref 6.0–8.5)

## 2018-02-01 LAB — PHOSPHORUS: Phosphorus: 3.6 mg/dL (ref 3.0–4.3)

## 2018-02-02 ENCOUNTER — Ambulatory Visit: Payer: Self-pay | Admitting: Internal Medicine

## 2018-03-18 ENCOUNTER — Other Ambulatory Visit: Payer: Self-pay | Admitting: Internal Medicine

## 2018-03-22 ENCOUNTER — Encounter: Payer: Self-pay | Admitting: Internal Medicine

## 2018-04-06 DIAGNOSIS — I129 Hypertensive chronic kidney disease with stage 1 through stage 4 chronic kidney disease, or unspecified chronic kidney disease: Secondary | ICD-10-CM | POA: Diagnosis not present

## 2018-04-06 DIAGNOSIS — E1122 Type 2 diabetes mellitus with diabetic chronic kidney disease: Secondary | ICD-10-CM | POA: Diagnosis not present

## 2018-04-06 DIAGNOSIS — E7800 Pure hypercholesterolemia, unspecified: Secondary | ICD-10-CM | POA: Diagnosis not present

## 2018-04-06 DIAGNOSIS — N183 Chronic kidney disease, stage 3 (moderate): Secondary | ICD-10-CM | POA: Diagnosis not present

## 2018-05-22 ENCOUNTER — Other Ambulatory Visit: Payer: Self-pay | Admitting: Internal Medicine

## 2018-05-30 ENCOUNTER — Encounter: Payer: Self-pay | Admitting: Internal Medicine

## 2018-05-30 ENCOUNTER — Other Ambulatory Visit: Payer: Self-pay

## 2018-05-30 ENCOUNTER — Ambulatory Visit (INDEPENDENT_AMBULATORY_CARE_PROVIDER_SITE_OTHER): Payer: Medicare Other | Admitting: Internal Medicine

## 2018-05-30 VITALS — BP 132/76 | HR 80 | Temp 98.4°F | Ht 60.4 in | Wt 133.0 lb

## 2018-05-30 DIAGNOSIS — E1122 Type 2 diabetes mellitus with diabetic chronic kidney disease: Secondary | ICD-10-CM | POA: Diagnosis not present

## 2018-05-30 DIAGNOSIS — Z6825 Body mass index (BMI) 25.0-25.9, adult: Secondary | ICD-10-CM

## 2018-05-30 DIAGNOSIS — I129 Hypertensive chronic kidney disease with stage 1 through stage 4 chronic kidney disease, or unspecified chronic kidney disease: Secondary | ICD-10-CM | POA: Diagnosis not present

## 2018-05-30 DIAGNOSIS — N183 Chronic kidney disease, stage 3 unspecified: Secondary | ICD-10-CM

## 2018-05-30 NOTE — Progress Notes (Signed)
Subjective:     Patient ID: Holly Peters , female    DOB: April 22, 1950 , 68 y.o.   MRN: [id number]   Chief Complaint  Patient presents with  . Diabetes  . Hypertension    HPI  Diabetes  She presents for her follow-up diabetic visit. She has type 2 diabetes mellitus. Her disease course has been stable. There are no hypoglycemic associated symptoms. Pertinent negatives for diabetes include no blurred vision and no chest pain. There are no hypoglycemic complications. Risk factors for coronary artery disease include diabetes mellitus, dyslipidemia, hypertension, sedentary lifestyle and post-menopausal. Her weight is stable. She is following a diabetic diet. Her breakfast blood glucose is taken between 8-9 am. Her breakfast blood glucose range is generally 90-110 mg/dl. Eye exam is current.  Hypertension  This is a chronic problem. The current episode started more than 1 year ago. The problem has been gradually improving since onset. The problem is controlled. Pertinent negatives include no blurred vision or chest pain.     Past Medical History:  Diagnosis Date  . Diabetes mellitus without complication ([place])   . Hyperlipemia   . Hypertension      Family History  Problem Relation Age of Onset  . Diabetes Mother   . Healthy Father      Current Outpatient Medications:  .  alendronate (FOSAMAX) 70 MG tablet, Take 70 mg by mouth once a week. Take with a full glass of water on an empty stomach., Disp: , Rfl:  .  Cholecalciferol (VITAMIN D) 125 MCG (5000 UT) CAPS, Take by mouth., Disp: , Rfl:  .  diltiazem (CARDIZEM CD) 240 MG 24 hr capsule, TAKE 1 CAPSULE DAILY, Disp: 90 capsule, Rfl: 3 .  FREESTYLE LITE test strip, , Disp: , Rfl:  .  ibuprofen (ADVIL,MOTRIN) 400 MG tablet, Take 400 mg by mouth every 6 (six) hours as needed., Disp: , Rfl:  .  JANUMET 50-500 MG tablet, TAKE 1 TABLET TWICE A DAY WITH MEALS, Disp: 180 tablet, Rfl: 3 .  MICARDIS HCT 80-25 MG tablet, , Disp: , Rfl:  .   simvastatin (ZOCOR) 20 MG tablet, TAKE 1 TABLET DAILY, Disp: 90 tablet, Rfl: 4   No Known Allergies   Review of Systems  Constitutional: Negative.   Eyes: Negative for blurred vision.  Respiratory: Negative.   Cardiovascular: Negative.  Negative for chest pain.  Gastrointestinal: Negative.   Neurological: Negative.   Psychiatric/Behavioral: Negative.      Today's Vitals   05/30/18 0900  BP: 132/76  Pulse: 80  Temp: 98.4 F (36.9 C)  TempSrc: Oral  Weight: 133 lb (60.3 kg)  Height: 5' 0.4" (1.534 m)  PainSc: 0-No pain   Body mass index is 25.63 kg/m.   Objective:  Physical Exam Vitals signs and nursing note reviewed.  Constitutional:      Appearance: Normal appearance.  HENT:     Head: Normocephalic and atraumatic.  Neck:     Musculoskeletal: Normal range of motion.  Cardiovascular:     Rate and Rhythm: Normal rate and regular rhythm.     Heart sounds: Normal heart sounds.  Pulmonary:     Effort: Pulmonary effort is normal.     Breath sounds: Normal breath sounds.  Skin:    General: Skin is warm.  Neurological:     General: No focal deficit present.     Mental Status: She is alert.  Psychiatric:        Mood and Affect: Mood normal.  Behavior: Behavior normal.         Assessment And Plan:     1. Type 2 diabetes mellitus with stage 3 chronic kidney disease, without long-term current use of insulin ([place])  I will check labs as listed below. I also discussed with her new therapies to address DM which will address help to decrease cardiovascular risk. We discussed use of GLP-1 agonists and SGLT-2 inhibitors. Due to her renal function, I think the GLP-1 class is better for her. She is unsure about doing a once weekly injection. She denies family history of thyroid cancer. Pt advised that we would instruct her on how to self administer the medication. She does not wish to make any changes at this time. I do want to decrease her Janumet to once daily. I do  expect her a1c to rise slightly, but once daily dosing would be more tolerable for her kidneys. Greater than 50% of face to face time was spent in counseling and coordination of care.   - BMP8+EGFR - Hemoglobin A1c - Phosphorus  2. Hypertensive nephropathy  Fair control. She reports dose of Micardis/hct was recently increased by her nephrologist. She is encouraged to incorporate more exercise into her daily routine.   3. Adult BMI 25.0-25.9 kg/sq m  Her weight is stable.   Maximino Greenland, MD    THE PATIENT IS ENCOURAGED TO PRACTICE SOCIAL DISTANCING DUE TO THE COVID-19 PANDEMIC.

## 2018-05-30 NOTE — Patient Instructions (Addendum)
Ozempic or Trulicity - once weekly injection  Rybelsus - daily oral medication  Farxiga/Jardiance - daily oral medication    Diabetes Mellitus and Emily care is an important part of your health, especially when you have diabetes. Diabetes may cause you to have problems because of poor blood flow (circulation) to your feet and legs, which can cause your skin to:  Become thinner and drier.  Break more easily.  Heal more slowly.  Peel and crack. You may also have nerve damage (neuropathy) in your legs and feet, causing decreased feeling in them. This means that you may not notice minor injuries to your feet that could lead to more serious problems. Noticing and addressing any potential problems early is the best way to prevent future foot problems. How to care for your feet Foot hygiene  Wash your feet daily with warm water and mild soap. Do not use hot water. Then, pat your feet and the areas between your toes until they are completely dry. Do not soak your feet as this can dry your skin.  Trim your toenails straight across. Do not dig under them or around the cuticle. File the edges of your nails with an emery board or nail file.  Apply a moisturizing lotion or petroleum jelly to the skin on your feet and to dry, brittle toenails. Use lotion that does not contain alcohol and is unscented. Do not apply lotion between your toes. Shoes and socks  Wear clean socks or stockings every day. Make sure they are not too tight. Do not wear knee-high stockings since they may decrease blood flow to your legs.  Wear shoes that fit properly and have enough cushioning. Always look in your shoes before you put them on to be sure there are no objects inside.  To break in new shoes, wear them for just a few hours a day. This prevents injuries on your feet. Wounds, scrapes, corns, and calluses  Check your feet daily for blisters, cuts, bruises, sores, and redness. If you cannot see the bottom  of your feet, use a mirror or ask someone for help.  Do not cut corns or calluses or try to remove them with medicine.  If you find a minor scrape, cut, or break in the skin on your feet, keep it and the skin around it clean and dry. You may clean these areas with mild soap and water. Do not clean the area with peroxide, alcohol, or iodine.  If you have a wound, scrape, corn, or callus on your foot, look at it several times a day to make sure it is healing and not infected. Check for: ? Redness, swelling, or pain. ? Fluid or blood. ? Warmth. ? Pus or a bad smell. General instructions  Do not cross your legs. This may decrease blood flow to your feet.  Do not use heating pads or hot water bottles on your feet. They may burn your skin. If you have lost feeling in your feet or legs, you may not know this is happening until it is too late.  Protect your feet from hot and cold by wearing shoes, such as at the beach or on hot pavement.  Schedule a complete foot exam at least once a year (annually) or more often if you have foot problems. If you have foot problems, report any cuts, sores, or bruises to your health care provider immediately. Contact a health care provider if:  You have a medical condition that increases your  risk of infection and you have any cuts, sores, or bruises on your feet.  You have an injury that is not healing.  You have redness on your legs or feet.  You feel burning or tingling in your legs or feet.  You have pain or cramps in your legs and feet.  Your legs or feet are numb.  Your feet always feel cold.  You have pain around a toenail. Get help right away if:  You have a wound, scrape, corn, or callus on your foot and: ? You have pain, swelling, or redness that gets worse. ? You have fluid or blood coming from the wound, scrape, corn, or callus. ? Your wound, scrape, corn, or callus feels warm to the touch. ? You have pus or a bad smell coming from the  wound, scrape, corn, or callus. ? You have a fever. ? You have a red line going up your leg. Summary  Check your feet every day for cuts, sores, red spots, swelling, and blisters.  Moisturize feet and legs daily.  Wear shoes that fit properly and have enough cushioning.  If you have foot problems, report any cuts, sores, or bruises to your health care provider immediately.  Schedule a complete foot exam at least once a year (annually) or more often if you have foot problems. This information is not intended to replace advice given to you by your health care provider. Make sure you discuss any questions you have with your health care provider. Document Released: 12/19/1999 Document Revised: 02/02/2017 Document Reviewed: 01/23/2016 Elsevier Interactive Patient Education  2019 [employer].

## 2018-05-31 LAB — HEMOGLOBIN A1C
Est. average glucose Bld gHb Est-mCnc: 123 mg/dL
Hgb A1c MFr Bld: 5.9 % — ABNORMAL HIGH (ref 4.8–5.6)

## 2018-05-31 LAB — PHOSPHORUS: Phosphorus: 3.6 mg/dL (ref 3.0–4.3)

## 2018-05-31 LAB — BMP8+EGFR
BUN/Creatinine Ratio: 13 (ref 12–28)
BUN: 27 mg/dL (ref 8–27)
CO2: 21 mmol/L (ref 20–29)
Calcium: 10 mg/dL (ref 8.7–10.3)
Chloride: 103 mmol/L (ref 96–106)
Creatinine, Ser: 2.01 mg/dL — ABNORMAL HIGH (ref 0.57–1.00)
GFR calc Af Amer: 29 mL/min/{1.73_m2} — ABNORMAL LOW (ref >59)
GFR calc non Af Amer: 25 mL/min/{1.73_m2} — ABNORMAL LOW (ref >59)
Glucose: 90 mg/dL (ref 65–99)
Potassium: 4.4 mmol/L (ref 3.5–5.2)
Sodium: 142 mmol/L (ref 134–144)

## 2018-06-05 ENCOUNTER — Telehealth: Payer: Self-pay

## 2018-06-05 NOTE — Telephone Encounter (Signed)
Patient called stating she really doesn't want to take any of the meds you all had discussed at her visit because it causes weight loss. She doesn't want to do the injection or janument is there something else she can take? Patient stated she cant afford to lose anymore weight.

## 2018-06-05 NOTE — Telephone Encounter (Signed)
She may take janumet once daily for now.

## 2018-06-07 NOTE — Telephone Encounter (Signed)
Patient notified YRL,RMA 

## 2018-07-03 ENCOUNTER — Other Ambulatory Visit: Payer: Self-pay | Admitting: Internal Medicine

## 2018-07-19 ENCOUNTER — Encounter: Payer: Self-pay | Admitting: Internal Medicine

## 2018-07-19 DIAGNOSIS — E119 Type 2 diabetes mellitus without complications: Secondary | ICD-10-CM | POA: Diagnosis not present

## 2018-07-19 DIAGNOSIS — H25813 Combined forms of age-related cataract, bilateral: Secondary | ICD-10-CM | POA: Diagnosis not present

## 2018-07-19 LAB — HM DIABETES EYE EXAM

## 2018-07-25 ENCOUNTER — Other Ambulatory Visit: Payer: Self-pay

## 2018-07-25 MED ORDER — MICARDIS HCT 80-25 MG PO TABS: 1.0000 | ORAL_TABLET | Freq: Every day | ORAL | 1 refills | Status: DC

## 2018-07-26 ENCOUNTER — Other Ambulatory Visit: Payer: Self-pay | Admitting: Internal Medicine

## 2018-09-20 ENCOUNTER — Telehealth: Payer: Self-pay | Admitting: Internal Medicine

## 2018-09-20 NOTE — Chronic Care Management (AMB) (Signed)
Chronic Care Management   Note  09/20/2018 Name: Holly Peters MRN: [id number] DOB: Mar 19, 1950  [name] is a 68 y.o. year old female who is a primary care patient of Glendale Chard, MD. I reached out to [employer] by phone today in response to a referral sent by [name]'s health plan.    [name] was given information about Chronic Care Management services today including:  1. CCM service includes personalized support from designated clinical staff supervised by her physician, including individualized plan of care and coordination with other care providers 2. 24/7 contact phone numbers for assistance for urgent and routine care needs. 3. Service will only be billed when office clinical staff spend 20 minutes or more in a month to coordinate care. 4. Only one practitioner may furnish and bill the service in a calendar month. 5. The patient may stop CCM services at any time (effective at the end of the month) by phone call to the office staff. 6. The patient will be responsible for cost sharing (co-pay) of up to 20% of the service fee (after annual deductible is met).  Patient agreed to services and verbal consent obtained.   Follow up plan: Telephone appointment with CCM team member scheduled for: 10/23/2018  [place]  ??[email or web address]   ??[phone]

## 2018-09-29 DIAGNOSIS — Z23 Encounter for immunization: Secondary | ICD-10-CM | POA: Diagnosis not present

## 2018-10-03 ENCOUNTER — Other Ambulatory Visit: Payer: Self-pay

## 2018-10-03 ENCOUNTER — Ambulatory Visit: Payer: Medicare Other

## 2018-10-03 ENCOUNTER — Ambulatory Visit (INDEPENDENT_AMBULATORY_CARE_PROVIDER_SITE_OTHER): Payer: Medicare Other

## 2018-10-03 VITALS — BP 115/76 | HR 89 | Ht 61.5 in | Wt 135.8 lb

## 2018-10-03 DIAGNOSIS — Z0000 Encounter for general adult medical examination without abnormal findings: Secondary | ICD-10-CM

## 2018-10-03 NOTE — Patient Instructions (Signed)
Holly Peters , Thank you for taking time to come for your Medicare Wellness Visit. I appreciate your ongoing commitment to your health goals. Please review the following plan we discussed and let me know if I can assist you in the future.   Screening recommendations/referrals: Colonoscopy: 03/2017 Mammogram: 01/2018 Bone Density: 07/2016 Recommended yearly ophthalmology/optometry visit for glaucoma screening and checkup Recommended yearly dental visit for hygiene and checkup  Vaccinations: Influenza vaccine: 09/2018 Pneumococcal vaccine: 09/2017 Tdap vaccine: 11/2010 Shingles vaccine: discussed    Advanced directives: Please bring a copy of your POA (Power of [place]) and/or Living Will to your next appointment.    Conditions/risks identified: overweight  Next appointment: 10/11/2018 at 9:30   Preventive Care 14 Years and Older, Female Preventive care refers to lifestyle choices and visits with your health care provider that can promote health and wellness. What does preventive care include?  A yearly physical exam. This is also called an annual well check.  Dental exams once or twice a year.  Routine eye exams. Ask your health care provider how often you should have your eyes checked.  Personal lifestyle choices, including:  Daily care of your teeth and gums.  Regular physical activity.  Eating a healthy diet.  Avoiding tobacco and drug use.  Limiting alcohol use.  Practicing safe sex.  Taking low-dose aspirin every day.  Taking vitamin and mineral supplements as recommended by your health care provider. What happens during an annual well check? The services and screenings done by your health care provider during your annual well check will depend on your age, overall health, lifestyle risk factors, and family history of disease. Counseling  Your health care provider may ask you questions about your:  Alcohol use.  Tobacco use.  Drug use.  Emotional  well-being.  Home and relationship well-being.  Sexual activity.  Eating habits.  History of falls.  Memory and ability to understand (cognition).  Work and work Statistician.  Reproductive health. Screening  You may have the following tests or measurements:  Height, weight, and BMI.  Blood pressure.  Lipid and cholesterol levels. These may be checked every 5 years, or more frequently if you are over 27 years old.  Skin check.  Lung cancer screening. You may have this screening every year starting at age 68 if you have a 30-pack-year history of smoking and currently smoke or have quit within the past 15 years.  Fecal occult blood test (FOBT) of the stool. You may have this test every year starting at age 53.  Flexible sigmoidoscopy or colonoscopy. You may have a sigmoidoscopy every 5 years or a colonoscopy every 10 years starting at age 56.  Hepatitis C blood test.  Hepatitis B blood test.  Sexually transmitted disease (STD) testing.  Diabetes screening. This is done by checking your blood sugar (glucose) after you have not eaten for a while (fasting). You may have this done every 1-3 years.  Bone density scan. This is done to screen for osteoporosis. You may have this done starting at age 19.  Mammogram. This may be done every 1-2 years. Talk to your health care provider about how often you should have regular mammograms. Talk with your health care provider about your test results, treatment options, and if necessary, the need for more tests. Vaccines  Your health care provider may recommend certain vaccines, such as:  Influenza vaccine. This is recommended every year.  Tetanus, diphtheria, and acellular pertussis (Tdap, Td) vaccine. You may need a Td booster  every 10 years.  Zoster vaccine. You may need this after age 80.  Pneumococcal 13-valent conjugate (PCV13) vaccine. One dose is recommended after age 88.  Pneumococcal polysaccharide (PPSV23) vaccine. One  dose is recommended after age 62. Talk to your health care provider about which screenings and vaccines you need and how often you need them. This information is not intended to replace advice given to you by your health care provider. Make sure you discuss any questions you have with your health care provider. Document Released: 01/17/2015 Document Revised: 09/10/2015 Document Reviewed: 10/22/2014 Elsevier Interactive Patient Education  2017 [place] Prevention in the Home Falls can cause injuries. They can happen to people of all ages. There are many things you can do to make your home safe and to help prevent falls. What can I do on the outside of my home?  Regularly fix the edges of walkways and driveways and fix any cracks.  Remove anything that might make you trip as you walk through a door, such as a raised step or threshold.  Trim any bushes or trees on the path to your home.  Use bright outdoor lighting.  Clear any walking paths of anything that might make someone trip, such as rocks or tools.  Regularly check to see if handrails are loose or broken. Make sure that both sides of any steps have handrails.  Any raised decks and porches should have guardrails on the edges.  Have any leaves, snow, or ice cleared regularly.  Use sand or salt on walking paths during winter.  Clean up any spills in your garage right away. This includes oil or grease spills. What can I do in the bathroom?  Use night lights.  Install grab bars by the toilet and in the tub and shower. Do not use towel bars as grab bars.  Use non-skid mats or decals in the tub or shower.  If you need to sit down in the shower, use a plastic, non-slip stool.  Keep the floor dry. Clean up any water that spills on the floor as soon as it happens.  Remove soap buildup in the tub or shower regularly.  Attach bath mats securely with double-sided non-slip rug tape.  Do not have throw rugs and other  things on the floor that can make you trip. What can I do in the bedroom?  Use night lights.  Make sure that you have a light by your bed that is easy to reach.  Do not use any sheets or blankets that are too big for your bed. They should not hang down onto the floor.  Have a firm chair that has side arms. You can use this for support while you get dressed.  Do not have throw rugs and other things on the floor that can make you trip. What can I do in the kitchen?  Clean up any spills right away.  Avoid walking on wet floors.  Keep items that you use a lot in easy-to-reach places.  If you need to reach something above you, use a strong step stool that has a grab bar.  Keep electrical cords out of the way.  Do not use floor polish or wax that makes floors slippery. If you must use wax, use non-skid floor wax.  Do not have throw rugs and other things on the floor that can make you trip. What can I do with my stairs?  Do not leave any items on the stairs.  Make  sure that there are handrails on both sides of the stairs and use them. Fix handrails that are broken or loose. Make sure that handrails are as long as the stairways.  Check any carpeting to make sure that it is firmly attached to the stairs. Fix any carpet that is loose or worn.  Avoid having throw rugs at the top or bottom of the stairs. If you do have throw rugs, attach them to the floor with carpet tape.  Make sure that you have a light switch at the top of the stairs and the bottom of the stairs. If you do not have them, ask someone to add them for you. What else can I do to help prevent falls?  Wear shoes that:  Do not have high heels.  Have rubber bottoms.  Are comfortable and fit you well.  Are closed at the toe. Do not wear sandals.  If you use a stepladder:  Make sure that it is fully opened. Do not climb a closed stepladder.  Make sure that both sides of the stepladder are locked into place.  Ask  someone to hold it for you, if possible.  Clearly mark and make sure that you can see:  Any grab bars or handrails.  First and last steps.  Where the edge of each step is.  Use tools that help you move around (mobility aids) if they are needed. These include:  Canes.  Walkers.  Scooters.  Crutches.  Turn on the lights when you go into a dark area. Replace any light bulbs as soon as they burn out.  Set up your furniture so you have a clear path. Avoid moving your furniture around.  If any of your floors are uneven, fix them.  If there are any pets around you, be aware of where they are.  Review your medicines with your doctor. Some medicines can make you feel dizzy. This can increase your chance of falling. Ask your doctor what other things that you can do to help prevent falls. This information is not intended to replace advice given to you by your health care provider. Make sure you discuss any questions you have with your health care provider. Document Released: 10/17/2008 Document Revised: 05/29/2015 Document Reviewed: 01/25/2014 Elsevier Interactive Patient Education  2017 [employer].

## 2018-10-03 NOTE — Progress Notes (Addendum)
Subjective:   Holly Peters is a 68 y.o. female who presents for Medicare Annual (Subsequent) preventive examination.  This visit type was conducted due to national recommendations for restrictions regarding the COVID-19 Pandemic (e.g. social distancing). This format is felt to be most appropriate for this patient at this time. All issues noted in this document were discussed and addressed. No physical exam was performed (except for noted visual exam findings with Video Visits). The patient, Holly Peters, has given consent to perform this visit via video. Vital signs may be absent or patient reported.   Patient location:  At home   Nurse location:  [place] office   Review of Systems:  n/a Cardiac Risk Factors include: advanced age (>33men, >61 women);diabetes mellitus;hypertension     Objective:     Vitals: BP 115/76 Comment: per patient  Pulse 89 Comment: per patient  Ht 5' 1.5" (1.562 m) Comment: per patient  Wt 135 lb 12.8 oz (61.6 kg) Comment: per patient  BMI 25.24 kg/m   Body mass index is 25.24 kg/m.  Advanced Directives 10/03/2018 11/14/2017 11/11/2017 03/13/2015  Does Patient Have a Medical Advance Directive? Yes No No No  Type of Paramedic of [place];Living will - - -  Copy of [place] in Chart? No - copy requested - - -    Tobacco Social History   Tobacco Use  Smoking Status Never Smoker  Smokeless Tobacco Never Used     Counseling given: Not Answered   Clinical Intake:  Pre-visit preparation completed: Yes  Pain : No/denies pain     Nutritional Status: BMI 25 -29 Overweight Nutritional Risks: None Diabetes: Yes CBG done?: No Did pt. bring in CBG monitor from home?: No  How often do you need to have someone help you when you read instructions, pamphlets, or other written materials from your doctor or pharmacy?: 1 - Never What is the last grade level you completed in school?: some college   Interpreter Needed?: No  Information entered by :: NAllen LPN  Past Medical History:  Diagnosis Date  . Diabetes mellitus without complication ([place])   . Hyperlipemia   . Hypertension    Past Surgical History:  Procedure Laterality Date  . ABDOMINAL HYSTERECTOMY     Family History  Problem Relation Age of Onset  . Diabetes Mother   . Healthy Father    Social History   Socioeconomic History  . Marital status: Married    Spouse name: Not on file  . Number of children: Not on file  . Years of education: Not on file  . Highest education level: Not on file  Occupational History  . Occupation: retired  Scientific laboratory technician  . Financial resource strain: Not hard at all  . Food insecurity    Worry: Never true    Inability: Never true  . Transportation needs    Medical: No    Non-medical: No  Tobacco Use  . Smoking status: Never Smoker  . Smokeless tobacco: Never Used  Substance and Sexual Activity  . Alcohol use: No  . Drug use: No  . Sexual activity: Yes    Birth control/protection: Surgical  Lifestyle  . Physical activity    Days per week: 4 days    Minutes per session: 30 min  . Stress: Not at all  Relationships  . Social Herbalist on phone: Not on file    Gets together: Not on file    Attends  religious service: Not on file    Active member of club or organization: Not on file    Attends meetings of clubs or organizations: Not on file    Relationship status: Not on file  Other Topics Concern  . Not on file  Social History Narrative  . Not on file    Outpatient Encounter Medications as of 10/03/2018  Medication Sig  . alendronate (FOSAMAX) 70 MG tablet TAKE 1 TABLET EVERY WEEK IN THE MORNING AT LEAST 30 MINUTES BEFORE FIRST FOOD, BEVERAGE OR MEDICATION OF DAY  . Cholecalciferol (VITAMIN D) 125 MCG (5000 UT) CAPS Take by mouth.  . diltiazem (CARDIZEM CD) 240 MG 24 hr capsule TAKE 1 CAPSULE DAILY  . glucose blood (FREESTYLE LITE) test strip USE TO CHECK  BLOOD SUGAR ONCE DAILY  . ibuprofen (ADVIL,MOTRIN) 400 MG tablet Take 400 mg by mouth every 6 (six) hours as needed.  [name] JANUMET 50-500 MG tablet TAKE 1 TABLET TWICE A DAY WITH MEALS  . simvastatin (ZOCOR) 20 MG tablet TAKE 1 TABLET DAILY  . telmisartan-hydrochlorothiazide (MICARDIS HCT) 80-25 MG tablet TAKE 1 TABLET BY MOUTH ONCE DAILY   No facility-administered encounter medications on file as of 10/03/2018.     Activities of Daily Living In your present state of health, do you have any difficulty performing the following activities: 10/03/2018  Hearing? Y  Comment sometimes trouble with understanding  Vision? N  Difficulty concentrating or making decisions? N  Walking or climbing stairs? N  Dressing or bathing? N  Doing errands, shopping? N  Preparing Food and eating ? N  Using the Toilet? N  In the past six months, have you accidently leaked urine? N  Do you have problems with loss of bowel control? N  Managing your Medications? N  Managing your Finances? N  Housekeeping or managing your Housekeeping? N  Some recent data might be hidden    Patient Care Team: Glendale Chard, MD as PCP - General (Internal Medicine) Rex Kras, Claudette Stapler, RN as [place] Management    Assessment:   This is a routine wellness examination for Holly Peters.  Exercise Activities and Dietary recommendations Current Exercise Habits: Home exercise routine, Type of exercise: Other - see comments(stationary bike), Time (Minutes): 30, Frequency (Times/Week): 4, Weekly Exercise (Minutes/Week): 120  Goals    . Patient Stated     10/03/2018, wants to maintain       Fall Risk Fall Risk  10/03/2018 05/30/2018 01/31/2018 11/23/2017 12/09/2016  Falls in the past year? 0 1 0 1 No  Comment - - - [facility] Telephone Survey: data to providers prior to load [employer] Telephone Survey: data to providers prior to load  Number falls in past yr: 0 0 - 1 -  Comment - - - [facility] Telephone Survey Actual Response = 1 -   Injury with Fall? - 1 - 1 -  Risk for fall due to : Medication side effect - - - -  Follow up Falls evaluation completed;Falls prevention discussed - - - -   Is the patient's home free of loose throw rugs in walkways, pet beds, electrical cords, etc?   yes      Grab bars in the bathroom? yes      Handrails on the stairs?   n/a      Adequate lighting?   yes  Timed Get Up and Go performed: n/a  Depression Screen PHQ 2/9 Scores 10/03/2018 05/30/2018 01/31/2018  PHQ - 2 Score 0 0 0  PHQ-  9 Score 3 - -     Cognitive Function     6CIT Screen 10/03/2018  What Year? 0 points  What month? 0 points  What time? 0 points  Count back from 20 0 points  Months in reverse 0 points  Repeat phrase 0 points  Total Score 0    Immunization History  Administered Date(s) Administered  . Pneumococcal Conjugate-13 10/03/2017  . Zoster Recombinat (Shingrix) 02/20/2018, 07/17/2018    Qualifies for Shingles Vaccine? yes  Screening Tests Health Maintenance  Topic Date Due  . FOOT EXAM  02/14/1960  . PNA vac Low Risk Adult (2 of 2 - PPSV23) 10/05/2018  . HEMOGLOBIN A1C  11/30/2018  . OPHTHALMOLOGY EXAM  07/19/2019  . MAMMOGRAM  01/19/2020  . TETANUS/TDAP  11/11/2020  . COLONOSCOPY  03/24/2027  . INFLUENZA VACCINE  Completed  . DEXA SCAN  Completed  . Hepatitis C Screening  Completed    Cancer Screenings: Lung: Low Dose CT Chest recommended if Age 62-80 years, 30 pack-year currently smoking OR have quit w/in 15years. Patient does not qualify. Breast:  Up to date on Mammogram? Yes   Up to date of Bone Density/Dexa? Yes Colorectal: up to date  Additional Screenings: : Hepatitis C Screening: 12/2012     Plan:    Patient wants to maintain health.   I have personally reviewed and noted the following in the patient's chart:   . Medical and social history . Use of alcohol, tobacco or illicit drugs  . Current medications and supplements . Functional ability and status . Nutritional  status . Physical activity . Advanced directives . List of other physicians . Hospitalizations, surgeries, and ER visits in previous 12 months . Vitals . Screenings to include cognitive, depression, and falls . Referrals and appointments  In addition, I have reviewed and discussed with patient certain preventive protocols, quality metrics, and best practice recommendations. A written personalized care plan for preventive services as well as general preventive health recommendations were provided to patient.     Kellie Simmering, LPN  [id number]

## 2018-10-04 ENCOUNTER — Encounter: Payer: Self-pay | Admitting: Internal Medicine

## 2018-10-06 DIAGNOSIS — E7800 Pure hypercholesterolemia, unspecified: Secondary | ICD-10-CM | POA: Diagnosis not present

## 2018-10-06 DIAGNOSIS — I129 Hypertensive chronic kidney disease with stage 1 through stage 4 chronic kidney disease, or unspecified chronic kidney disease: Secondary | ICD-10-CM | POA: Diagnosis not present

## 2018-10-06 DIAGNOSIS — E1122 Type 2 diabetes mellitus with diabetic chronic kidney disease: Secondary | ICD-10-CM | POA: Diagnosis not present

## 2018-10-06 DIAGNOSIS — Z1159 Encounter for screening for other viral diseases: Secondary | ICD-10-CM | POA: Diagnosis not present

## 2018-10-06 DIAGNOSIS — N183 Chronic kidney disease, stage 3 unspecified: Secondary | ICD-10-CM | POA: Diagnosis not present

## 2018-10-11 ENCOUNTER — Ambulatory Visit (INDEPENDENT_AMBULATORY_CARE_PROVIDER_SITE_OTHER): Payer: Medicare Other | Admitting: Internal Medicine

## 2018-10-11 ENCOUNTER — Encounter: Payer: Self-pay | Admitting: Internal Medicine

## 2018-10-11 ENCOUNTER — Ambulatory Visit: Payer: Self-pay

## 2018-10-11 ENCOUNTER — Other Ambulatory Visit: Payer: Self-pay

## 2018-10-11 VITALS — BP 132/78 | HR 76 | Temp 98.2°F | Ht 61.5 in | Wt 135.2 lb

## 2018-10-11 DIAGNOSIS — E1122 Type 2 diabetes mellitus with diabetic chronic kidney disease: Secondary | ICD-10-CM | POA: Diagnosis not present

## 2018-10-11 DIAGNOSIS — E7800 Pure hypercholesterolemia, unspecified: Secondary | ICD-10-CM | POA: Diagnosis not present

## 2018-10-11 DIAGNOSIS — Z6825 Body mass index (BMI) 25.0-25.9, adult: Secondary | ICD-10-CM

## 2018-10-11 DIAGNOSIS — I129 Hypertensive chronic kidney disease with stage 1 through stage 4 chronic kidney disease, or unspecified chronic kidney disease: Secondary | ICD-10-CM | POA: Diagnosis not present

## 2018-10-11 DIAGNOSIS — N183 Chronic kidney disease, stage 3 unspecified: Secondary | ICD-10-CM

## 2018-10-11 NOTE — Progress Notes (Signed)
Subjective:     Patient ID: Holly Peters , female    DOB: [birth year] , 68 y.o.   MRN: [id number]   Chief Complaint  Patient presents with  . Diabetes  . Hypertension    HPI  Diabetes She presents for her follow-up diabetic visit. She has type 2 diabetes mellitus. Her disease course has been stable. There are no hypoglycemic associated symptoms. Pertinent negatives for diabetes include no blurred vision and no chest pain. There are no hypoglycemic complications. Risk factors for coronary artery disease include diabetes mellitus, dyslipidemia, hypertension, sedentary lifestyle and post-menopausal. Her weight is stable. She is following a diabetic diet. Her breakfast blood glucose is taken between 8-9 am. Her breakfast blood glucose range is generally 90-110 mg/dl. Eye exam is current.  Hypertension This is a chronic problem. The current episode started more than 1 year ago. The problem has been gradually improving since onset. The problem is controlled. Pertinent negatives include no blurred vision or chest pain. The current treatment provides moderate improvement.     Past Medical History:  Diagnosis Date  . Diabetes mellitus without complication ([place])   . Hyperlipemia   . Hypertension      Family History  Problem Relation Age of Onset  . Diabetes Mother   . Healthy Father      Current Outpatient Medications:  .  alendronate (FOSAMAX) 70 MG tablet, TAKE 1 TABLET EVERY WEEK IN THE MORNING AT LEAST 30 MINUTES BEFORE FIRST FOOD, BEVERAGE OR MEDICATION OF DAY, Disp: 12 tablet, Rfl: 3 .  Cholecalciferol (VITAMIN D) 125 MCG (5000 UT) CAPS, Take by mouth., Disp: , Rfl:  .  diltiazem (CARDIZEM CD) 240 MG 24 hr capsule, TAKE 1 CAPSULE DAILY, Disp: 90 capsule, Rfl: 3 .  glucose blood (FREESTYLE LITE) test strip, USE TO CHECK BLOOD SUGAR ONCE DAILY, Disp: 100 each, Rfl: 3 .  ibuprofen (ADVIL,MOTRIN) 400 MG tablet, Take 400 mg by mouth every 6 (six) hours as needed., Disp: , Rfl:  .   JANUMET 50-500 MG tablet, TAKE 1 TABLET TWICE A DAY WITH MEALS, Disp: 180 tablet, Rfl: 3 .  simvastatin (ZOCOR) 20 MG tablet, TAKE 1 TABLET DAILY, Disp: 90 tablet, Rfl: 4 .  telmisartan-hydrochlorothiazide (MICARDIS HCT) 80-25 MG tablet, TAKE 1 TABLET BY MOUTH ONCE DAILY, Disp: 30 tablet, Rfl: 1   No Known Allergies   Review of Systems  Constitutional: Negative.   Eyes: Negative for blurred vision.  Respiratory: Negative.   Cardiovascular: Negative.  Negative for chest pain.  Gastrointestinal: Negative.   Neurological: Negative.   Psychiatric/Behavioral: Negative.      Today's Vitals   10/11/18 0934  BP: 132/78  Pulse: 76  Temp: 98.2 F (36.8 C)  TempSrc: Oral  SpO2: 99%  Weight: 135 lb 3.2 oz (61.3 kg)  Height: 5' 1.5" (1.562 m)   Body mass index is 25.13 kg/m.   Objective:  Physical Exam Vitals signs and nursing note reviewed.  Constitutional:      Appearance: Normal appearance.  HENT:     Head: Normocephalic and atraumatic.  Cardiovascular:     Rate and Rhythm: Normal rate and regular rhythm.     Heart sounds: Normal heart sounds.  Pulmonary:     Effort: Pulmonary effort is normal.     Breath sounds: Normal breath sounds.  Skin:    General: Skin is warm.  Neurological:     General: No focal deficit present.     Mental Status: She is alert.  Psychiatric:  Mood and Affect: Mood normal.        Behavior: Behavior normal.         Assessment And Plan:     1. Type 2 diabetes mellitus with stage 3 chronic kidney disease, without long-term current use of insulin, unspecified whether stage 3a or 3b CKD ([place])  I will check labs as listed below. Diabetic foot exam was performed today. She would like to return next months for a physical examination. She was commended on her lifestyle change and is encouraged to continue to exercise four to five days per week.   - CMP14+EGFR - CBC - Lipid panel - Hemoglobin A1c  2. Hypertensive nephropathy  Chronic,  controlled. She will continue with current meds. She is encouraged to avoid adding salt to her foods. She is encouraged to strive for 150 minutes of regular exercise per week.   3. Pure hypercholesterolemia  Chronic. I will check fasting lipid panel and LFTs today. She is encouraged to avoid/limit fried food intake, exercise regularly and to take a fiber supplement. I will make further recommendations once her labs are available for review.   4. Body mass index (BMI) of 25.0-25.9 in adult  Her weight is stable.   Maximino Greenland, MD    THE PATIENT IS ENCOURAGED TO PRACTICE SOCIAL DISTANCING DUE TO THE COVID-19 PANDEMIC.

## 2018-10-12 ENCOUNTER — Other Ambulatory Visit: Payer: Self-pay

## 2018-10-12 LAB — LIPID PANEL
Chol/HDL Ratio: 2.7 ratio (ref 0.0–4.4)
Cholesterol, Total: 152 mg/dL (ref 100–199)
HDL: 56 mg/dL (ref >39)
LDL Chol Calc (NIH): 65 mg/dL (ref 0–99)
Triglycerides: 186 mg/dL — ABNORMAL HIGH (ref 0–149)
VLDL Cholesterol Cal: 31 mg/dL (ref 5–40)

## 2018-10-12 LAB — CMP14+EGFR
ALT: 13 IU/L (ref 0–32)
AST: 17 IU/L (ref 0–40)
Albumin/Globulin Ratio: 1.4 (ref 1.2–2.2)
Albumin: 4.4 g/dL (ref 3.8–4.8)
Alkaline Phosphatase: 52 IU/L (ref 39–117)
BUN/Creatinine Ratio: 14 (ref 12–28)
BUN: 27 mg/dL (ref 8–27)
Bilirubin Total: 0.2 mg/dL (ref 0.0–1.2)
CO2: 22 mmol/L (ref 20–29)
Calcium: 9.9 mg/dL (ref 8.7–10.3)
Chloride: 106 mmol/L (ref 96–106)
Creatinine, Ser: 1.98 mg/dL — ABNORMAL HIGH (ref 0.57–1.00)
GFR calc Af Amer: 29 mL/min/{1.73_m2} — ABNORMAL LOW (ref >59)
GFR calc non Af Amer: 25 mL/min/{1.73_m2} — ABNORMAL LOW (ref >59)
Globulin, Total: 3.2 g/dL (ref 1.5–4.5)
Glucose: 107 mg/dL — ABNORMAL HIGH (ref 65–99)
Potassium: 4.4 mmol/L (ref 3.5–5.2)
Sodium: 144 mmol/L (ref 134–144)
Total Protein: 7.6 g/dL (ref 6.0–8.5)

## 2018-10-12 LAB — CBC
Hematocrit: 34.6 % (ref 34.0–46.6)
Hemoglobin: 11.2 g/dL (ref 11.1–15.9)
MCH: 29.6 pg (ref 26.6–33.0)
MCHC: 32.4 g/dL (ref 31.5–35.7)
MCV: 92 fL (ref 79–97)
Platelets: 319 10*3/uL (ref 150–450)
RBC: 3.78 x10E6/uL (ref 3.77–5.28)
RDW: 13 % (ref 11.7–15.4)
WBC: 10.3 10*3/uL (ref 3.4–10.8)

## 2018-10-12 LAB — HEMOGLOBIN A1C
Est. average glucose Bld gHb Est-mCnc: 137 mg/dL
Hgb A1c MFr Bld: 6.4 % — ABNORMAL HIGH (ref 4.8–5.6)

## 2018-10-12 MED ORDER — SITAGLIPTIN PHOSPHATE 25 MG PO TABS: 25.0000 mg | ORAL_TABLET | Freq: Every day | ORAL | 3 refills | Status: DC

## 2018-10-23 ENCOUNTER — Telehealth: Payer: Medicare Other

## 2018-10-26 ENCOUNTER — Emergency Department (HOSPITAL_BASED_OUTPATIENT_CLINIC_OR_DEPARTMENT_OTHER): Payer: Medicare Other

## 2018-10-26 ENCOUNTER — Encounter (HOSPITAL_BASED_OUTPATIENT_CLINIC_OR_DEPARTMENT_OTHER): Payer: Self-pay

## 2018-10-26 ENCOUNTER — Other Ambulatory Visit: Payer: Self-pay

## 2018-10-26 ENCOUNTER — Emergency Department (HOSPITAL_BASED_OUTPATIENT_CLINIC_OR_DEPARTMENT_OTHER)
Admission: EM | Admit: 2018-10-26 | Discharge: 2018-10-26 | Disposition: A | Discharge status: Home / Self Care | Payer: Medicare Other | Attending: Emergency Medicine | Admitting: Emergency Medicine

## 2018-10-26 DIAGNOSIS — M79672 Pain in left foot: Secondary | ICD-10-CM

## 2018-10-26 DIAGNOSIS — Z79899 Other long term (current) drug therapy: Secondary | ICD-10-CM | POA: Diagnosis not present

## 2018-10-26 DIAGNOSIS — E7800 Pure hypercholesterolemia, unspecified: Secondary | ICD-10-CM | POA: Insufficient documentation

## 2018-10-26 DIAGNOSIS — I129 Hypertensive chronic kidney disease with stage 1 through stage 4 chronic kidney disease, or unspecified chronic kidney disease: Secondary | ICD-10-CM | POA: Insufficient documentation

## 2018-10-26 DIAGNOSIS — N183 Chronic kidney disease, stage 3 unspecified: Secondary | ICD-10-CM | POA: Insufficient documentation

## 2018-10-26 DIAGNOSIS — E1122 Type 2 diabetes mellitus with diabetic chronic kidney disease: Secondary | ICD-10-CM | POA: Insufficient documentation

## 2018-10-26 DIAGNOSIS — Z7984 Long term (current) use of oral hypoglycemic drugs: Secondary | ICD-10-CM | POA: Insufficient documentation

## 2018-10-26 DIAGNOSIS — M25572 Pain in left ankle and joints of left foot: Secondary | ICD-10-CM | POA: Diagnosis not present

## 2018-10-26 NOTE — ED Provider Notes (Signed)
[facility] EMERGENCY DEPARTMENT Provider Note   CSN: [id number] Arrival date & time: 10/26/18  1114     History   Chief Complaint Chief Complaint  Patient presents with  . Foot Pain    HPI Holly Peters is a 68 y.o. female.     68yo F w/ PMH including HTN, HLD, T2DM who p/w L foot pain. 6 days ago, she began having L foot pain after standing on her tip-toes to reach something high up. She denies any fall or trauma. Pain began in 1st MTP joint and then spread on her dorsal foot, now is up into her ankle. She has had associated mild swelling. No fevers or recent illness. No h/o gout.   The history is provided by the patient.  Foot Pain    Past Medical History:  Diagnosis Date  . Diabetes mellitus without complication ([place])   . Hyperlipemia   . Hypertension     Patient Active Problem List   Diagnosis Date Noted  . Type 2 diabetes mellitus with stage 3 chronic kidney disease, without long-term current use of insulin ([place]) 01/31/2018  . Hypertensive nephropathy 01/31/2018  . Pure hypercholesterolemia 01/31/2018    Past Surgical History:  Procedure Laterality Date  . ABDOMINAL HYSTERECTOMY       OB History   No obstetric history on file.      Home Medications    Prior to Admission medications   Medication Sig Start Date End Date Taking? Authorizing Provider  alendronate (FOSAMAX) 70 MG tablet TAKE 1 TABLET EVERY WEEK IN THE MORNING AT LEAST 30 MINUTES BEFORE FIRST FOOD, BEVERAGE OR MEDICATION OF DAY 07/03/18   Glendale Chard, MD  Cholecalciferol (VITAMIN D) 125 MCG (5000 UT) CAPS Take by mouth.    [provider]  diltiazem (CARDIZEM CD) 240 MG 24 hr capsule TAKE 1 CAPSULE DAILY 03/20/18   Glendale Chard, MD  glucose blood (FREESTYLE LITE) test strip USE TO CHECK BLOOD SUGAR ONCE DAILY 07/03/18   Glendale Chard, MD  ibuprofen (ADVIL,MOTRIN) 400 MG tablet Take 400 mg by mouth every 6 (six) hours as needed.    [provider]   JANUMET 50-500 MG tablet TAKE 1 TABLET TWICE A DAY WITH MEALS 05/22/18   Glendale Chard, MD  simvastatin (ZOCOR) 20 MG tablet TAKE 1 TABLET DAILY 01/30/18   Glendale Chard, MD  sitaGLIPtin (JANUVIA) 25 MG tablet Take 1 tablet (25 mg total) by mouth daily. 10/12/18   Glendale Chard, MD  telmisartan-hydrochlorothiazide (MICARDIS HCT) 80-25 MG tablet TAKE 1 TABLET BY MOUTH ONCE DAILY 07/27/18   Glendale Chard, MD    Family History Family History  Problem Relation Age of Onset  . Diabetes Mother   . Healthy Father     Social History Social History   Tobacco Use  . Smoking status: Never Smoker  . Smokeless tobacco: Never Used  Substance Use Topics  . Alcohol use: No  . Drug use: No     Allergies   Patient has no known allergies.   Review of Systems Review of Systems  Constitutional: Negative for fever.  Musculoskeletal: Positive for gait problem and joint swelling.  Skin: Negative for color change and rash.  Neurological: Negative for numbness.     Physical Exam Updated Vital Signs BP (!) 144/78 (BP Location: Right Arm)   Pulse 88   Temp 99.5 F (37.5 C) (Oral)   Resp 18   Ht 5\' 1"  (1.549 m)   Wt 61.2 kg   SpO2  100%   BMI 25.51 kg/m   Physical Exam Vitals signs and nursing note reviewed.  Constitutional:      General: She is not in acute distress.    Appearance: She is well-developed.  HENT:     Head: Normocephalic and atraumatic.  Eyes:     Conjunctiva/sclera: Conjunctivae normal.  Neck:     Musculoskeletal: Neck supple.  Cardiovascular:     Pulses: Normal pulses.  Pulmonary:     Effort: Pulmonary effort is normal.  Musculoskeletal:        General: Swelling and tenderness present.     Comments: L foot/ankle: tenderness of 1st MTP joint with mild edema on central dorsal foot and mild edema of ankle; no erythema or warmth; no base of 5th metatarsal tenderness  Skin:    General: Skin is warm and dry.  Neurological:     Mental Status: She is alert and  oriented to person, place, and time.     Sensory: No sensory deficit.  Psychiatric:        Judgment: Judgment normal.      ED Treatments / Results  Labs (all labs ordered are listed, but only abnormal results are displayed) Labs Reviewed - No data to display  EKG None  Radiology Dg Ankle Complete Left  Result Date: 10/26/2018 CLINICAL DATA:  LEFT foot and ankle pain after standing on tiptoes a week ago, pain all over, pain at midfoot, base of first metatarsal and ankle EXAM: LEFT ANKLE COMPLETE - 3+ VIEW COMPARISON:  None FINDINGS: Osseous mineralization normal. Joint spaces preserved. Small rounded ossicle at lateral LEFT ankle. No acute fracture, dislocation, or bone destruction. Scattered small vessel vascular calcifications. IMPRESSION: No acute osseous abnormalities. Electronically Signed   By: Lavonia Dana M.D.   On: 10/26/2018 12:00   Dg Foot Complete Left  Result Date: 10/26/2018 CLINICAL DATA:  Left midfoot, base of first metatarsal and ankle pain for 6 days, stood on tippy toes EXAM: LEFT FOOT - COMPLETE 3+ VIEW COMPARISON:  None. FINDINGS: No fracture or dislocation of the left foot. Joint spaces are well preserved. Vascular calcinosis about the foot and ankle. IMPRESSION: No fracture or dislocation of the left foot. Joint spaces are well preserved. Electronically Signed   By: Eddie Candle M.D.   On: 10/26/2018 12:31    Procedures Procedures (including critical care time)  Medications Ordered in ED Medications - No data to display   Initial Impression / Assessment and Plan / ED Course  I have reviewed the triage vital signs and the nursing notes.  Pertinent imaging results that were available during my care of the patient were reviewed by me and considered in my medical decision making (see chart for details).       DDX includes stress fracture, foot sprain, or gout/pseudogout. I doubt infection based on slow progression and reassuring exam. XR negative acute. I  discussed possibility of sprain from standing in plantarflexion when reaching vs 1st episode of gout. No concerning features warranting arthrocentesis.  Applied Ace wrap and discussed supportive measures emphasizing need for elevation and ice.  Discussed sports medicine follow-up as needed for persistent symptoms.  Extensively reviewed return precautions and she voiced understanding.  Final Clinical Impressions(s) / ED Diagnoses   Final diagnoses:  Foot pain, left    ED Discharge Orders    None       , Wenda Overland, MD 10/26/18 1252

## 2018-10-26 NOTE — ED Notes (Signed)
C/o  Left foot pain onset 6 days ago after standing on toes reaching up in cabinet

## 2018-10-26 NOTE — ED Triage Notes (Signed)
Pt c/o pain to left foot and ankle x 6 days-denies known injury-NAD-to tx room in w/c

## 2018-11-03 ENCOUNTER — Encounter: Payer: Self-pay | Admitting: Family Medicine

## 2018-11-03 ENCOUNTER — Other Ambulatory Visit: Payer: Self-pay

## 2018-11-03 ENCOUNTER — Ambulatory Visit: Payer: Self-pay

## 2018-11-03 ENCOUNTER — Ambulatory Visit (INDEPENDENT_AMBULATORY_CARE_PROVIDER_SITE_OTHER): Payer: Medicare Other | Admitting: Family Medicine

## 2018-11-03 VITALS — BP 133/82 | HR 86 | Ht 62.0 in | Wt 136.0 lb

## 2018-11-03 DIAGNOSIS — M79675 Pain in left toe(s): Secondary | ICD-10-CM

## 2018-11-03 DIAGNOSIS — M79672 Pain in left foot: Secondary | ICD-10-CM

## 2018-11-03 MED ORDER — PREDNISONE 20 MG PO TABS: ORAL_TABLET | ORAL | 0 refills | Status: DC

## 2018-11-03 NOTE — Assessment & Plan Note (Signed)
Pain seems to be associated with an inflammatory change.  Possible for gout.  Less likely for degenerative changes.  Avoiding anti-inflammatories and colchicine secondary to her elevated creatinine -Prednisone.  Counseled on its use with her diabetes and blood sugar monitoring. -Provided postop shoe. -May need to consider injection if limited improvement.

## 2018-11-03 NOTE — Progress Notes (Signed)
Holly Peters - 68 y.o. female MRN [id number]  Date of birth: 09-01-1950  SUBJECTIVE:  Including CC & ROS.  Chief Complaint  Patient presents with  . Foot Pain    left foot/Great toe x 2 weeks    Holly Peters is a 68 y.o. female that is presenting with acute left great toe pain.  The pain is been ongoing for 2 weeks.  The pain is constant and severe.  It is occurring over the dorsal aspect of the great toe.  She is unable to flex or extend the toe.  Denies any specific inciting event.  Pain is localized but does have some radiation proximally.  No history of gout.  No improvement with medications to date.  Was evaluated the emergency department.  Independent review of the left ankle and foot x-ray from 10/22 shows no acute abnormality.   Review of Systems  Constitutional: Negative for fever.  HENT: Negative for congestion.   Respiratory: Negative for cough.   Cardiovascular: Negative for chest pain.  Gastrointestinal: Negative for abdominal pain.  Musculoskeletal: Positive for gait problem and joint swelling.  Skin: Negative for color change.  Neurological: Negative for weakness.  Hematological: Negative for adenopathy.    HISTORY: Past Medical, Surgical, Social, and Family History Reviewed & Updated per EMR.   Pertinent Historical Findings include:  Past Medical History:  Diagnosis Date  . Diabetes mellitus without complication ([place])   . Hyperlipemia   . Hypertension     Past Surgical History:  Procedure Laterality Date  . ABDOMINAL HYSTERECTOMY      No Known Allergies  Family History  Problem Relation Age of Onset  . Diabetes Mother   . Healthy Father      Social History   Socioeconomic History  . Marital status: Married    Spouse name: Not on file  . Number of children: Not on file  . Years of education: Not on file  . Highest education level: Not on file  Occupational History  . Occupation: retired  Scientific laboratory technician  . Financial resource  strain: Not hard at all  . Food insecurity    Worry: Never true    Inability: Never true  . Transportation needs    Medical: No    Non-medical: No  Tobacco Use  . Smoking status: Never Smoker  . Smokeless tobacco: Never Used  Substance and Sexual Activity  . Alcohol use: No  . Drug use: No  . Sexual activity: Not on file  Lifestyle  . Physical activity    Days per week: 4 days    Minutes per session: 30 min  . Stress: Not at all  Relationships  . Social Herbalist on phone: Not on file    Gets together: Not on file    Attends religious service: Not on file    Active member of club or organization: Not on file    Attends meetings of clubs or organizations: Not on file    Relationship status: Not on file  . Intimate partner violence    Fear of current or ex partner: No    Emotionally abused: No    Physically abused: No    Forced sexual activity: No  Other Topics Concern  . Not on file  Social History Narrative  . Not on file     PHYSICAL EXAM:  VS: BP 133/82   Pulse 86   Ht 5\' 2"  (1.575 m)   Wt 136 lb (61.7 kg)  BMI 24.87 kg/m  Physical Exam Gen: NAD, alert, cooperative with exam, well-appearing ENT: normal lips, normal nasal mucosa,  Eye: normal EOM, normal conjunctiva and lids CV:  no edema, +2 pedal pulses   Resp: no accessory muscle use, non-labored,  Skin: no rashes, no areas of induration  Neuro: normal tone, normal sensation to touch Psych:  normal insight, alert and oriented MSK:  Right foot: Tenderness to palpation at the first MTP joint. Limited active and passive flexion and extension. Normal ankle range of motion. No streaking. Some redness over the first MTP joint. Neurovascularly intact  Limited ultrasound: Right foot:  Increased hyperemia and synovitis of the first MTP joint.  Mild degenerative changes observed. No changes of the first metatarsal. No significant degenerative changes of the midfoot. No effusion of the ankle  joint  Summary: Findings would suggest a gouty change of the first MTP joint  Ultrasound and interpretation by Clearance Coots, MD      ASSESSMENT & PLAN:   Great toe pain, left Pain seems to be associated with an inflammatory change.  Possible for gout.  Less likely for degenerative changes.  Avoiding anti-inflammatories and colchicine secondary to her elevated creatinine -Prednisone.  Counseled on its use with her diabetes and blood sugar monitoring. -Provided postop shoe. -May need to consider injection if limited improvement.

## 2018-11-03 NOTE — Patient Instructions (Signed)
Nice to meet you Please try the prednisone. This will cause your blood sugar to go up so you'll have to watch your sugar and your diet.  Please try the post op shoe  Please try ice   Please send me a message in MyChart with any questions or updates.  Please see me back in 2 weeks.   --Dr. Raeford Razor

## 2018-11-06 ENCOUNTER — Other Ambulatory Visit: Payer: Self-pay

## 2018-11-06 ENCOUNTER — Encounter: Payer: Self-pay | Admitting: Family Medicine

## 2018-11-06 MED ORDER — SITAGLIPTIN PHOSPHATE 25 MG PO TABS: 25.0000 mg | ORAL_TABLET | Freq: Every day | ORAL | 0 refills | Status: DC

## 2018-11-17 ENCOUNTER — Ambulatory Visit (INDEPENDENT_AMBULATORY_CARE_PROVIDER_SITE_OTHER): Payer: Medicare Other | Admitting: Family Medicine

## 2018-11-17 ENCOUNTER — Other Ambulatory Visit: Payer: Self-pay

## 2018-11-17 ENCOUNTER — Other Ambulatory Visit: Payer: Self-pay | Admitting: Family Medicine

## 2018-11-17 ENCOUNTER — Encounter: Payer: Self-pay | Admitting: Family Medicine

## 2018-11-17 VITALS — BP 146/84 | HR 82 | Ht 62.0 in | Wt 136.0 lb

## 2018-11-17 DIAGNOSIS — M79675 Pain in left toe(s): Secondary | ICD-10-CM | POA: Diagnosis not present

## 2018-11-17 NOTE — Progress Notes (Signed)
Holly Peters - 68 y.o. female MRN [id number]  Date of birth: 1950-06-04  SUBJECTIVE:  Including CC & ROS.  Chief Complaint  Patient presents with  . Follow-up    follow up for left foot    Holly Peters is a 68 y.o. female that is following up for her left great toe pain.  She completed a course of prednisone and that improved her symptoms.  Her blood sugar did get elevated but not to a severe extent.  She no longer has any pain and is doing well.   Review of Systems  Constitutional: Negative for fever.  HENT: Negative for congestion.   Respiratory: Negative for cough.   Cardiovascular: Negative for chest pain.  Gastrointestinal: Negative for abdominal distention.  Musculoskeletal: Positive for arthralgias.  Skin: Negative for color change.  Neurological: Negative for weakness.  Hematological: Negative for adenopathy.    HISTORY: Past Medical, Surgical, Social, and Family History Reviewed & Updated per EMR.   Pertinent Historical Findings include:  Past Medical History:  Diagnosis Date  . Diabetes mellitus without complication ([place])   . Hyperlipemia   . Hypertension     Past Surgical History:  Procedure Laterality Date  . ABDOMINAL HYSTERECTOMY      No Known Allergies  Family History  Problem Relation Age of Onset  . Diabetes Mother   . Healthy Father      Social History   Socioeconomic History  . Marital status: Married    Spouse name: Not on file  . Number of children: Not on file  . Years of education: Not on file  . Highest education level: Not on file  Occupational History  . Occupation: retired  Scientific laboratory technician  . Financial resource strain: Not hard at all  . Food insecurity    Worry: Never true    Inability: Never true  . Transportation needs    Medical: No    Non-medical: No  Tobacco Use  . Smoking status: Never Smoker  . Smokeless tobacco: Never Used  Substance and Sexual Activity  . Alcohol use: No  . Drug use: No  . Sexual  activity: Not on file  Lifestyle  . Physical activity    Days per week: 4 days    Minutes per session: 30 min  . Stress: Not at all  Relationships  . Social Herbalist on phone: Not on file    Gets together: Not on file    Attends religious service: Not on file    Active member of club or organization: Not on file    Attends meetings of clubs or organizations: Not on file    Relationship status: Not on file  . Intimate partner violence    Fear of current or ex partner: No    Emotionally abused: No    Physically abused: No    Forced sexual activity: No  Other Topics Concern  . Not on file  Social History Narrative  . Not on file     PHYSICAL EXAM:  VS: BP (!) 146/84   Pulse 82   Ht 5\' 2"  (1.575 m)   Wt 136 lb (61.7 kg)   BMI 24.87 kg/m  Physical Exam Gen: NAD, alert, cooperative with exam, well-appearing ENT: normal lips, normal nasal mucosa,  Eye: normal EOM, normal conjunctiva and lids CV:  no edema, +2 pedal pulses   Resp: no accessory muscle use, non-labored,  Skin: no rashes, no areas of induration  Neuro: normal tone,  normal sensation to touch Psych:  normal insight, alert and oriented MSK:  Left foot: No swelling or redness. Normal range of motion. Neurovascularly intact     ASSESSMENT & PLAN:   Great toe pain, left Has had improvement with the course of prednisone.  Likely related to gout. -Uric acid. -Counseled on supportive care.

## 2018-11-17 NOTE — Assessment & Plan Note (Signed)
Has had improvement with the course of prednisone.  Likely related to gout. -Uric acid. -Counseled on supportive care.

## 2018-11-17 NOTE — Patient Instructions (Signed)
Good to see you I will call with the results from today   Please send me a message in MyChart with any questions or updates.  Please see [place] back as needed.   --Dr. Raeford Razor

## 2018-11-18 LAB — URIC ACID: Uric Acid: 9.9 mg/dL — ABNORMAL HIGH (ref 2.5–7.1)

## 2018-11-21 ENCOUNTER — Ambulatory Visit (INDEPENDENT_AMBULATORY_CARE_PROVIDER_SITE_OTHER): Payer: Medicare Other | Admitting: Nurse Practitioner

## 2018-11-21 ENCOUNTER — Other Ambulatory Visit: Payer: Self-pay

## 2018-11-21 ENCOUNTER — Encounter: Payer: Self-pay | Admitting: Nurse Practitioner

## 2018-11-21 VITALS — BP 138/80 | HR 84 | Temp 98.6°F | Ht 61.8 in | Wt 136.8 lb

## 2018-11-21 DIAGNOSIS — Z0000 Encounter for general adult medical examination without abnormal findings: Secondary | ICD-10-CM | POA: Diagnosis not present

## 2018-11-21 DIAGNOSIS — I129 Hypertensive chronic kidney disease with stage 1 through stage 4 chronic kidney disease, or unspecified chronic kidney disease: Secondary | ICD-10-CM | POA: Diagnosis not present

## 2018-11-21 DIAGNOSIS — N183 Chronic kidney disease, stage 3 unspecified: Secondary | ICD-10-CM

## 2018-11-21 DIAGNOSIS — E1122 Type 2 diabetes mellitus with diabetic chronic kidney disease: Secondary | ICD-10-CM

## 2018-11-21 DIAGNOSIS — I10 Essential (primary) hypertension: Secondary | ICD-10-CM

## 2018-11-21 LAB — POCT UA - MICROALBUMIN
Albumin/Creatinine Ratio, Urine, POC: 300
Creatinine, POC: 50 mg/dL
Microalbumin Ur, POC: 30 mg/L

## 2018-11-21 LAB — POCT URINALYSIS DIPSTICK
Bilirubin, UA: NEGATIVE
Blood, UA: NEGATIVE
Glucose, UA: NEGATIVE
Ketones, UA: NEGATIVE
Nitrite, UA: NEGATIVE
Protein, UA: NEGATIVE
Spec Grav, UA: 1.02 (ref 1.010–1.025)
Urobilinogen, UA: 0.2 E.U./dL
pH, UA: 5.5 (ref 5.0–8.0)

## 2018-11-21 NOTE — Progress Notes (Signed)
Subjective:     Patient ID: Holly Peters , female    DOB: 09-19-1950 , 68 y.o.   MRN: [id number]   Chief Complaint  Patient presents with  . Annual Exam    HPI  Here for HM  Diabetes She presents for her follow-up diabetic visit. She has type 2 diabetes mellitus. Pertinent negatives for hypoglycemia include no dizziness or headaches. She is compliant with treatment all of the time. (Blood sugar this morning 151 and has been in 140's since switching was in 110's.  )    The patient states she uses status post hysterectomy for birth control.   Mammogram last done 01/18/2018 Negative for: breast discharge, breast lump(s), breast pain and breast self exam.  Pertinent negatives include abnormal bleeding (hematology), anxiety, decreased libido, depression, difficulty falling sleep, dyspareunia, history of infertility, nocturia, sexual dysfunction, sleep disturbances, urinary incontinence, urinary urgency, vaginal discharge and vaginal itching. Diet regular fairly healthy.  She sprained her right and left toe a few weeks ago - she has been to the ER for the left great toe.  The patient states her exercise level is minimal currently due to her sprained toe. She is being followed by Dr. Raeford Razor and he is checking her uric acid.    The patient's tobacco use is:  Social History   Tobacco Use  Smoking Status Never Smoker  Smokeless Tobacco Never Used   She has been exposed to passive smoke. The patient's alcohol use is:  Social History   Substance and Sexual Activity  Alcohol Use No   Past Medical History:  Diagnosis Date  . Diabetes mellitus without complication ([place])   . Hyperlipemia   . Hypertension      Family History  Problem Relation Age of Onset  . Diabetes Mother   . Healthy Father      Current Outpatient Medications:  .  alendronate (FOSAMAX) 70 MG tablet, TAKE 1 TABLET EVERY WEEK IN THE MORNING AT LEAST 30 MINUTES BEFORE FIRST FOOD, BEVERAGE OR MEDICATION OF DAY,  Disp: 12 tablet, Rfl: 3 .  Cholecalciferol (VITAMIN D) 125 MCG (5000 UT) CAPS, Take by mouth., Disp: , Rfl:  .  diltiazem (CARDIZEM CD) 240 MG 24 hr capsule, TAKE 1 CAPSULE DAILY, Disp: 90 capsule, Rfl: 3 .  glucose blood (FREESTYLE LITE) test strip, USE TO CHECK BLOOD SUGAR ONCE DAILY, Disp: 100 each, Rfl: 3 .  simvastatin (ZOCOR) 20 MG tablet, TAKE 1 TABLET DAILY, Disp: 90 tablet, Rfl: 4 .  sitaGLIPtin (JANUVIA) 25 MG tablet, Take 1 tablet (25 mg total) by mouth daily., Disp: 90 tablet, Rfl: 0 .  telmisartan-hydrochlorothiazide (MICARDIS HCT) 80-25 MG tablet, TAKE 1 TABLET BY MOUTH ONCE DAILY, Disp: 30 tablet, Rfl: 1 .  ibuprofen (ADVIL,MOTRIN) 400 MG tablet, Take 400 mg by mouth every 6 (six) hours as needed., Disp: , Rfl:  .  JANUMET 50-500 MG tablet, TAKE 1 TABLET TWICE A DAY WITH MEALS (Patient not taking: Reported on 11/21/2018), Disp: 180 tablet, Rfl: 3   No Known Allergies   Review of Systems  Constitutional: Negative.   HENT: Negative.   Eyes: Negative.   Respiratory: Negative.   Cardiovascular: Negative.   Gastrointestinal: Negative.   Endocrine: Negative.   Genitourinary: Negative.   Musculoskeletal: Negative.   Skin: Negative.   Allergic/Immunologic: Negative.   Neurological: Negative.  Negative for dizziness and headaches.  Hematological: Negative.   Psychiatric/Behavioral: Negative.      Today's Vitals   11/21/18 0906  BP: 138/80  Pulse:  84  Temp: 98.6 F (37 C)  TempSrc: Oral  Weight: 136 lb 12.8 oz (62.1 kg)  Height: 5' 1.8" (1.57 m)  PainSc: 2   PainLoc: Toe   Body mass index is 25.18 kg/m.   Objective:  Physical Exam Constitutional:      Appearance: Normal appearance. She is well-developed.  HENT:     Head: Normocephalic and atraumatic.     Right Ear: Hearing, tympanic membrane, ear canal and external ear normal.     Left Ear: Hearing, tympanic membrane, ear canal and external ear normal.     Nose: Nose normal.     Mouth/Throat:     Mouth:  Mucous membranes are dry.  Eyes:     General: Lids are normal.     Extraocular Movements: Extraocular movements intact.     Pupils: Pupils are equal, round, and reactive to light.     Funduscopic exam:    Right eye: No papilledema.        Left eye: No papilledema.  Neck:     Musculoskeletal: Full passive range of motion without pain, normal range of motion and neck supple.     Thyroid: No thyroid mass.     Vascular: No carotid bruit.  Cardiovascular:     Rate and Rhythm: Normal rate and regular rhythm.     Pulses: Normal pulses.     Heart sounds: Normal heart sounds. No murmur.  Pulmonary:     Effort: Pulmonary effort is normal.     Breath sounds: Normal breath sounds.  Chest:     Breasts:        Right: Normal. No mass or tenderness.        Left: Normal. No mass or tenderness.  Abdominal:     General: Abdomen is flat. Bowel sounds are normal.     Palpations: Abdomen is soft.  Musculoskeletal: Normal range of motion.        General: No swelling.     Right lower leg: No edema.     Left lower leg: No edema.  Lymphadenopathy:     Upper Body:     Right upper body: No axillary adenopathy.     Left upper body: No axillary adenopathy.  Skin:    General: Skin is warm and dry.     Capillary Refill: Capillary refill takes less than 2 seconds.  Neurological:     General: No focal deficit present.     Mental Status: She is alert and oriented to person, place, and time.     Cranial Nerves: No cranial nerve deficit.     Sensory: No sensory deficit.  Psychiatric:        Mood and Affect: Mood normal.        Behavior: Behavior normal.        Thought Content: Thought content normal.        Judgment: Judgment normal.         Assessment And Plan:     1. Health maintenance examination . Behavior modifications discussed and diet history reviewed.   . Pt will continue to exercise regularly and modify diet with low GI, plant based foods and decrease intake of processed foods.   . Recommend intake of daily multivitamin, Vitamin D, and calcium.  . Recommend mammogram (due in Jan 2021) and colonoscopy (up to date) for preventive screenings, as well as recommend immunizations that include influenza, TDAP  2. Essential hypertension . B/P is controlled.  . CMP ordered to check renal function.  [name]  Continue with regular exercise and dietary modification   . EKG reveals NSR HR 76 - POCT Urinalysis Dipstick (81002) - EKG 12-Lead - POCT UA - Microalbumin  3. Type 2 diabetes mellitus with stage 3 chronic kidney disease, without long-term current use of insulin, unspecified whether stage 3a or 3b CKD (HCC)  Chronic, blood sugar has been slightly elevated since changing from Janumet to Januvia 69m will recheck kidney functions and glucose, will likely increase to 584mJanuvia after discussing with Dr. SaBaird CancerShe is not interested in Ozempic due to the side effect of weight loss.   - BMP8+eGFR         JaMinette BrineFNP    THE PATIENT IS ENCOURAGED TO PRACTICE SOCIAL DISTANCING DUE TO THE COVID-19 PANDEMIC.

## 2018-11-22 ENCOUNTER — Telehealth: Payer: Self-pay | Admitting: Family Medicine

## 2018-11-22 ENCOUNTER — Telehealth: Payer: Self-pay

## 2018-11-22 MED ORDER — ALLOPURINOL 100 MG PO TABS: 100.0000 mg | ORAL_TABLET | Freq: Every day | ORAL | 1 refills | Status: DC

## 2018-11-22 NOTE — Telephone Encounter (Signed)
Patient's PCP said its ok for her to start allopurinol.   Requesting medication to be sent to [facility] on [address].

## 2018-11-22 NOTE — Telephone Encounter (Signed)
Patient needs to come to the office and have her bloodwork done I have left her a v/m to call the office [facility]

## 2018-11-22 NOTE — Telephone Encounter (Signed)
Sent allopurinol in.   Rosemarie Ax, MD [facility] 11/22/2018, 4:46 PM

## 2018-11-22 NOTE — Telephone Encounter (Signed)
Informed of elevated uric acid.  She will ask her primary care if it is okay to start allopurinol.  Would likely start this with a low-dose of prednisone to offset any acute gout flare.  Rosemarie Ax, MD [facility] 11/22/2018, 4:01 PM

## 2018-11-23 ENCOUNTER — Telehealth: Payer: Self-pay

## 2018-11-23 DIAGNOSIS — E1122 Type 2 diabetes mellitus with diabetic chronic kidney disease: Secondary | ICD-10-CM | POA: Diagnosis not present

## 2018-11-23 DIAGNOSIS — N183 Chronic kidney disease, stage 3 unspecified: Secondary | ICD-10-CM | POA: Diagnosis not present

## 2018-11-23 LAB — BMP8+EGFR
BUN/Creatinine Ratio: 12 (ref 12–28)
BUN: 25 mg/dL (ref 8–27)
CO2: 17 mmol/L — ABNORMAL LOW (ref 20–29)
Calcium: 9.6 mg/dL (ref 8.7–10.3)
Chloride: 108 mmol/L — ABNORMAL HIGH (ref 96–106)
Creatinine, Ser: 2.13 mg/dL — ABNORMAL HIGH (ref 0.57–1.00)
GFR calc Af Amer: 27 mL/min/{1.73_m2} — ABNORMAL LOW (ref >59)
GFR calc non Af Amer: 23 mL/min/{1.73_m2} — ABNORMAL LOW (ref >59)
Glucose: 188 mg/dL — ABNORMAL HIGH (ref 65–99)
Potassium: 4.1 mmol/L (ref 3.5–5.2)
Sodium: 142 mmol/L (ref 134–144)

## 2018-11-23 NOTE — Telephone Encounter (Signed)
Spoke to patient and gave her information provided by physician.

## 2018-11-29 ENCOUNTER — Telehealth: Payer: Self-pay

## 2018-11-29 NOTE — Telephone Encounter (Signed)
The pt was told that her [place] shouldn't be increased because of the pt's kidney function and that Laurance Flatten, NP and Dr. Baird Cancer said as long as the pt's sugars stays below 150 then her sugars are ok.

## 2018-12-25 NOTE — Progress Notes (Signed)
Send a copy of her labs to Dr Otis Peak. thanks

## 2019-01-10 ENCOUNTER — Other Ambulatory Visit: Payer: Self-pay | Admitting: Internal Medicine

## 2019-01-16 ENCOUNTER — Telehealth: Payer: Self-pay

## 2019-01-22 ENCOUNTER — Encounter: Payer: Self-pay | Admitting: Family Medicine

## 2019-01-22 DIAGNOSIS — Z1231 Encounter for screening mammogram for malignant neoplasm of breast: Secondary | ICD-10-CM | POA: Diagnosis not present

## 2019-01-22 LAB — HM MAMMOGRAPHY

## 2019-01-23 ENCOUNTER — Encounter: Payer: Self-pay | Admitting: Internal Medicine

## 2019-02-04 DIAGNOSIS — Z23 Encounter for immunization: Secondary | ICD-10-CM | POA: Diagnosis not present

## 2019-02-12 ENCOUNTER — Encounter: Payer: Self-pay | Admitting: Internal Medicine

## 2019-02-12 ENCOUNTER — Ambulatory Visit (INDEPENDENT_AMBULATORY_CARE_PROVIDER_SITE_OTHER): Payer: Medicare Other | Admitting: Internal Medicine

## 2019-02-12 ENCOUNTER — Other Ambulatory Visit: Payer: Self-pay

## 2019-02-12 ENCOUNTER — Ambulatory Visit: Payer: Medicare Other

## 2019-02-12 VITALS — BP 144/78 | HR 87 | Temp 98.4°F | Ht 61.8 in | Wt 141.1 lb

## 2019-02-12 DIAGNOSIS — M1A30X0 Chronic gout due to renal impairment, unspecified site, without tophus (tophi): Secondary | ICD-10-CM

## 2019-02-12 DIAGNOSIS — E559 Vitamin D deficiency, unspecified: Secondary | ICD-10-CM | POA: Diagnosis not present

## 2019-02-12 DIAGNOSIS — I129 Hypertensive chronic kidney disease with stage 1 through stage 4 chronic kidney disease, or unspecified chronic kidney disease: Secondary | ICD-10-CM

## 2019-02-12 DIAGNOSIS — E663 Overweight: Secondary | ICD-10-CM | POA: Diagnosis not present

## 2019-02-12 DIAGNOSIS — E1122 Type 2 diabetes mellitus with diabetic chronic kidney disease: Secondary | ICD-10-CM | POA: Diagnosis not present

## 2019-02-12 DIAGNOSIS — Z6825 Body mass index (BMI) 25.0-25.9, adult: Secondary | ICD-10-CM | POA: Diagnosis not present

## 2019-02-12 DIAGNOSIS — N183 Chronic kidney disease, stage 3 unspecified: Secondary | ICD-10-CM

## 2019-02-12 DIAGNOSIS — E7800 Pure hypercholesterolemia, unspecified: Secondary | ICD-10-CM

## 2019-02-12 DIAGNOSIS — E2839 Other primary ovarian failure: Secondary | ICD-10-CM

## 2019-02-12 NOTE — Patient Instructions (Signed)

## 2019-02-12 NOTE — Progress Notes (Signed)
This visit occurred during the SARS-CoV-2 public health emergency.  Safety protocols were in place, including screening questions prior to the visit, additional usage of staff PPE, and extensive cleaning of exam room while observing appropriate contact time as indicated for disinfecting solutions.  Subjective:     Patient ID: Holly Peters , female    DOB: 29-Jul-1950 , 69 y.o.   MRN: [id number]   Chief Complaint  Patient presents with  . Diabetes  . Hypertension    HPI  She is here today for diabetes and high blood pressure check. She reports compliance with meds. Also reports that sugars have been higher since meds switched from [place].   Diabetes She presents for her follow-up diabetic visit. She has type 2 diabetes mellitus. Her disease course has been stable. There are no hypoglycemic associated symptoms. Pertinent negatives for diabetes include no blurred vision and no chest pain. There are no hypoglycemic complications. Risk factors for coronary artery disease include diabetes mellitus, dyslipidemia, hypertension, sedentary lifestyle and post-menopausal. Her weight is stable. She is following a diabetic diet. Her breakfast blood glucose is taken between 8-9 am. Her breakfast blood glucose range is generally 90-110 mg/dl. Eye exam is current.  Hypertension This is a chronic problem. The current episode started more than 1 year ago. The problem has been gradually improving since onset. The problem is controlled. Pertinent negatives include no blurred vision or chest pain. The current treatment provides moderate improvement.     Past Medical History:  Diagnosis Date  . Diabetes mellitus without complication ([place])   . Hyperlipemia   . Hypertension      Family History  Problem Relation Age of Onset  . Diabetes Mother   . Healthy Father      Current Outpatient Medications:  .  alendronate (FOSAMAX) 70 MG tablet, TAKE 1 TABLET EVERY WEEK IN THE MORNING AT LEAST  30 MINUTES BEFORE FIRST FOOD, BEVERAGE OR MEDICATION OF DAY, Disp: 12 tablet, Rfl: 3 .  allopurinol (ZYLOPRIM) 100 MG tablet, Take 1 tablet (100 mg total) by mouth daily., Disp: 60 tablet, Rfl: 1 .  Cholecalciferol (VITAMIN D) 125 MCG (5000 UT) CAPS, Take by mouth., Disp: , Rfl:  .  diltiazem (CARDIZEM CD) 240 MG 24 hr capsule, TAKE 1 CAPSULE DAILY, Disp: 90 capsule, Rfl: 3 .  glucose blood (FREESTYLE LITE) test strip, USE TO CHECK BLOOD SUGAR ONCE DAILY, Disp: 100 each, Rfl: 3 .  ibuprofen (ADVIL,MOTRIN) 400 MG tablet, Take 400 mg by mouth every 6 (six) hours as needed., Disp: , Rfl:  .  JANUVIA 25 MG tablet, TAKE 1 TABLET DAILY, Disp: 90 tablet, Rfl: 3 .  Psyllium (METAMUCIL FIBER PO), Take by mouth., Disp: , Rfl:  .  simvastatin (ZOCOR) 20 MG tablet, TAKE 1 TABLET DAILY, Disp: 90 tablet, Rfl: 4   No Known Allergies   Review of Systems  Constitutional: Negative.   Eyes: Negative for blurred vision.  Respiratory: Negative.   Cardiovascular: Negative.  Negative for chest pain.  Gastrointestinal: Negative.   Neurological: Negative.   Psychiatric/Behavioral: Negative.      Today's Vitals   02/12/19 0837  BP: (!) 152/88  Pulse: 87  Temp: 98.4 F (36.9 C)  TempSrc: Oral  Weight: 141 lb 1.6 oz (64 kg)  Height: 5' 1.8" (1.57 m)  PainSc: 0-No pain   Body mass index is 25.97 kg/m.   Objective:  Physical Exam Vitals and nursing note reviewed.  Constitutional:      Appearance: Normal  appearance.  HENT:     Head: Normocephalic and atraumatic.  Cardiovascular:     Rate and Rhythm: Normal rate and regular rhythm.     Heart sounds: Normal heart sounds.  Pulmonary:     Effort: Pulmonary effort is normal.     Breath sounds: Normal breath sounds.  Skin:    General: Skin is warm.  Neurological:     General: No focal deficit present.     Mental Status: She is alert.  Psychiatric:        Mood and Affect: Mood normal.        Behavior: Behavior normal.         Assessment And  Plan:     1. Type 2 diabetes mellitus with stage 3 chronic kidney disease, without long-term current use of insulin, unspecified whether stage 3a or 3b CKD (HCC)  Chronic.  I will check labs as listed below. She is now on Januvia 22m daily instead of Janumet due to renal function. She is encouraged to incorporate more exercise into her daily routine.   - BMP8+EGFR - Hemoglobin A1c  2. 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 Pt advised that due to her gout, I will need to change telmisartan/hct to plain telmisartan. She is willing to do this after finishing her current supply. She is advised to increase her water intake to at least 48 ounces  3. Pure hypercholesterolemia  Chronic, previous LDL results reviewed. This is within normal range.   4. Estrogen deficiency  Her last bone density was in 2018, significant for osteopenia. I will refer her to [facility] for bone density. She has been advised to stop alendronate due to her renal function. I will make further recommendations once her results are available for review.   - DG Bone Density; Future  5. Vitamin D deficiency  I WILL CHECK A VIT D LEVEL AND SUPPLEMENT AS NEEDED.  ALSO ENCOURAGED TO SPEND 15 MINUTES IN THE SUN DAILY.  - Vitamin D (25 hydroxy)  6. Chronic gout due to renal impairment without tophus, unspecified site  I will check uric acid level today. Advised to stay well hydrated. She will continue with allopurinol once daily.   - Uric acid  7. Overweight with body mass index (BMI) of 25 to 25.9 in adult  BMI stable for her demographics. She is encouraged to exercise no less than 150 minutes per week.   RMaximino Greenland MD    THE PATIENT IS ENCOURAGED TO PRACTICE SOCIAL DISTANCING DUE TO THE COVID-19 PANDEMIC.

## 2019-02-13 LAB — BMP8+EGFR
BUN/Creatinine Ratio: 15 (ref 12–28)
BUN: 33 mg/dL — ABNORMAL HIGH (ref 8–27)
CO2: 19 mmol/L — ABNORMAL LOW (ref 20–29)
Calcium: 10.1 mg/dL (ref 8.7–10.3)
Chloride: 106 mmol/L (ref 96–106)
Creatinine, Ser: 2.13 mg/dL — ABNORMAL HIGH (ref 0.57–1.00)
GFR calc Af Amer: 27 mL/min/{1.73_m2} — ABNORMAL LOW (ref >59)
GFR calc non Af Amer: 23 mL/min/{1.73_m2} — ABNORMAL LOW (ref >59)
Glucose: 157 mg/dL — ABNORMAL HIGH (ref 65–99)
Potassium: 4.2 mmol/L (ref 3.5–5.2)
Sodium: 141 mmol/L (ref 134–144)

## 2019-02-13 LAB — HEMOGLOBIN A1C
Est. average glucose Bld gHb Est-mCnc: 174 mg/dL
Hgb A1c MFr Bld: 7.7 % — ABNORMAL HIGH (ref 4.8–5.6)

## 2019-02-13 LAB — VITAMIN D 25 HYDROXY (VIT D DEFICIENCY, FRACTURES): Vit D, 25-Hydroxy: 57.6 ng/mL (ref 30.0–100.0)

## 2019-02-13 LAB — URIC ACID: Uric Acid: 6.6 mg/dL (ref 3.0–7.2)

## 2019-02-14 DIAGNOSIS — M85851 Other specified disorders of bone density and structure, right thigh: Secondary | ICD-10-CM | POA: Diagnosis not present

## 2019-02-14 DIAGNOSIS — E2839 Other primary ovarian failure: Secondary | ICD-10-CM | POA: Diagnosis not present

## 2019-02-14 LAB — HM DEXA SCAN

## 2019-02-25 DIAGNOSIS — Z23 Encounter for immunization: Secondary | ICD-10-CM | POA: Diagnosis not present

## 2019-02-26 ENCOUNTER — Telehealth: Payer: Self-pay

## 2019-02-26 ENCOUNTER — Ambulatory Visit: Payer: Self-pay

## 2019-02-26 DIAGNOSIS — E7800 Pure hypercholesterolemia, unspecified: Secondary | ICD-10-CM

## 2019-02-26 DIAGNOSIS — E1122 Type 2 diabetes mellitus with diabetic chronic kidney disease: Secondary | ICD-10-CM

## 2019-02-26 DIAGNOSIS — I10 Essential (primary) hypertension: Secondary | ICD-10-CM

## 2019-02-26 DIAGNOSIS — N183 Chronic kidney disease, stage 3 unspecified: Secondary | ICD-10-CM

## 2019-02-26 DIAGNOSIS — I129 Hypertensive chronic kidney disease with stage 1 through stage 4 chronic kidney disease, or unspecified chronic kidney disease: Secondary | ICD-10-CM

## 2019-02-27 ENCOUNTER — Telehealth: Payer: Medicare Other

## 2019-02-27 ENCOUNTER — Other Ambulatory Visit: Payer: Self-pay

## 2019-02-27 NOTE — Chronic Care Management (AMB) (Signed)
  Chronic Care Management   Outreach Note  02/27/2019 Name: Holly Peters MRN: [id number] DOB: 1950-07-04  Referred by: Glendale Chard, MD Reason for referral : Chronic Care Management (RQ Initial Call - HTN, DMII, CKDIV, Pure hypercholesterolemia)   An unsuccessful telephone outreach was attempted today. The patient was referred to the case management team for assistance with care management and care coordination.   Follow Up Plan: Telephone follow up appointment with care management team member scheduled for: 03/21/19  Barb Merino, RN, BSN, CCM Care Management Coordinator [place] [facility]  Direct Phone: [phone]

## 2019-03-20 ENCOUNTER — Other Ambulatory Visit: Payer: Self-pay | Admitting: Internal Medicine

## 2019-03-20 ENCOUNTER — Encounter: Payer: Self-pay | Admitting: Internal Medicine

## 2019-03-20 MED ORDER — ALLOPURINOL 100 MG PO TABS: 100.0000 mg | ORAL_TABLET | Freq: Every day | ORAL | 1 refills | Status: DC

## 2019-03-21 ENCOUNTER — Other Ambulatory Visit: Payer: Self-pay

## 2019-03-21 ENCOUNTER — Ambulatory Visit (INDEPENDENT_AMBULATORY_CARE_PROVIDER_SITE_OTHER): Payer: Medicare Other

## 2019-03-21 ENCOUNTER — Telehealth: Payer: Medicare Other

## 2019-03-21 DIAGNOSIS — I10 Essential (primary) hypertension: Secondary | ICD-10-CM

## 2019-03-21 DIAGNOSIS — N183 Chronic kidney disease, stage 3 unspecified: Secondary | ICD-10-CM

## 2019-03-21 DIAGNOSIS — E1122 Type 2 diabetes mellitus with diabetic chronic kidney disease: Secondary | ICD-10-CM

## 2019-03-21 DIAGNOSIS — E7800 Pure hypercholesterolemia, unspecified: Secondary | ICD-10-CM | POA: Diagnosis not present

## 2019-03-21 DIAGNOSIS — I129 Hypertensive chronic kidney disease with stage 1 through stage 4 chronic kidney disease, or unspecified chronic kidney disease: Secondary | ICD-10-CM

## 2019-03-22 ENCOUNTER — Ambulatory Visit: Payer: Self-pay | Admitting: Pharmacist

## 2019-03-22 DIAGNOSIS — N183 Chronic kidney disease, stage 3 unspecified: Secondary | ICD-10-CM

## 2019-03-22 DIAGNOSIS — E1122 Type 2 diabetes mellitus with diabetic chronic kidney disease: Secondary | ICD-10-CM

## 2019-03-26 ENCOUNTER — Ambulatory Visit: Payer: Self-pay | Admitting: Pharmacist

## 2019-03-26 DIAGNOSIS — E1122 Type 2 diabetes mellitus with diabetic chronic kidney disease: Secondary | ICD-10-CM

## 2019-03-26 DIAGNOSIS — N183 Chronic kidney disease, stage 3 unspecified: Secondary | ICD-10-CM

## 2019-03-26 NOTE — Progress Notes (Signed)
  Chronic Care Management   Outreach Note  03/22/2019 Name: Holly Peters MRN: [id number] DOB: 11-Jul-1950  Referred by: Glendale Chard, MD Reason for referral : Chronic Care Management and Diabetes  Patient case discussed with CCM RN CM, Angel Little.  T2DM patient with CKD4 on Januvia monotherapy for diabetes.  A1c has increased form 6.4% to 7.7%.  Metformin was discontinued due to CKD progression.  Will consider GLP-1 (Ozempic) to aid in optimal blood sugar controls.  Patient can benefit from cardioprotective and renal protective benefits that this medication can provide.  Follow Up Plan: The care management team will reach out to the patient again over the next 5 days.   SIGNATURE Regina Eck, PharmD, BCPS Clinical Pharmacist, [facility] [place]: [phone]

## 2019-03-27 ENCOUNTER — Other Ambulatory Visit: Payer: Self-pay | Admitting: Internal Medicine

## 2019-03-27 NOTE — Patient Instructions (Signed)
Visit Information  Goals Addressed      Patient Stated   . "to get my BP down a Holly Peters" (pt-stated)       CARE PLAN ENTRY (see longtitudinal plan of care for additional care plan information)  Current Barriers:  [name] Knowledge Deficits related to disease process and Self Health management of HTN . Chronic Disease Management support and education needs related to Essential Hypertension, CKD IV, DM II, HLD  Nurse Case Manager Clinical Goal(s):  [name] Over the next 90 days, patient will work with the CCM team and PCP to address needs related to disease education and support for improved Self management of HTN  CCM RN CM Interventions:  03/22/19 call completed with patient  . Evaluation of current treatment plan related to HTN and patient's adherence to plan as established by provider. . Provided education to patient re: target BP <130/80: Reviewed patient BP's and Determined patient is not at goal BP; Educated on short and long term risks of HBP . Reviewed medications with patient and discussed indication, dosage and frequency of prescribed antihypertensives; patient reports adherence w/o noted SE . Collaborated with embedded Pharm D regarding patient's current BP regimen; discussed patient is not at goal; Discussed patient now has StageIV CKD . Discussed plans with patient for ongoing care management follow up and provided patient with direct contact information for care management team . Advised patient, providing education and rationale, to monitor blood pressure daily and record, calling the CCM team and or PCP for findings outside established parameters.   Patient Self Care Activities:  . Self administers medications as prescribed . Attends all scheduled provider appointments . Calls pharmacy for medication refills . Performs ADL's independently . Performs IADL's independently . Calls provider office for new concerns or questions  Initial goal documentation     . "to maintain or  improve my kideny function" (pt-stated)       CARE PLAN ENTRY (see longtitudinal plan of care for additional care plan information)  Current Barriers:  [name] Knowledge Deficits related to disease process and Self Health management of DM, CKD . Chronic Disease Management support and education needs related to CKD IV, DM II, HTN, HLD  Nurse Case Manager Clinical Goal(s):  [name] Over the next 30 days, patient will maintain or improve current GFR of 27 and or will maintain or improve current BUN 33, Creatinine of 2.13 (obtained on 02/12/19)  . Over the next 90 days, patient will work with the CCM team and or PCP to address needs related to disease education and support to improve Self management of DM and CKD  CCM RN CM Interventions:  03/22/19 call completed with patient  . Evaluation of current treatment plan related to DM II, CKD IV and patient's adherence to plan as established by provider. . Provided education to patient re: current A1C of 7.7 obtained on 02/12/19; discussed this is an increase from 6.4; Educated on daily glycemic control of FBS 80-130, after meals <190; Educated on importance of getting better control of DM to help reduce risk of complications including worsening of CKD; Educated patient on the stages of CKD; Reviewed Stage IV and how to maintain and or improve this condition; Reviewed most current values; Determined patient is established with Nephrologist, Dr. Lyda Kalata is managing . Reviewed medications with patient and discussed indication, dosage and frequency of prescribed antidiabetic medication, patient reports adherence w/o noted SE; Reviewed and discussed past use of Januvia but d/c due to worsening renal impairment . Collaborated  with embedded Pharm D Lottie Dawson regarding patient current medication regimen for DM with poor control and concerns for worsening CKD . Discussed plans with patient for ongoing care management follow up and provided patient with direct contact  information for care management team . Provided patient with printed educational materials related to Diabetes Management using the Plate Method, Diabetes Zone Safety Tool, Carb Counting, Carb Choices; Stages of Chronic Kidney Disease; 6 Tips to be Water Wise . Advised patient, providing education and rationale, to check cbg daily before meals and record, calling the CCM team and or PCP for findings outside established parameters.    Patient Self Care Activities:  . Self administers medications as prescribed . Attends all scheduled provider appointments . Calls pharmacy for medication refills . Performs ADL's independently . Performs IADL's independently . Calls provider office for new concerns or questions  Initial goal documentation       Patient verbalizes understanding of instructions provided today.   Telephone follow up appointment with care management team member scheduled for: 04/19/19  Barb Merino, RN, BSN, CCM Care Management Coordinator [place] [facility]  Direct Phone: [phone]

## 2019-03-27 NOTE — Chronic Care Management (AMB) (Signed)
Chronic Care Management   Initial Visit Note  03/22/2019 Name: Holly Peters MRN: [id number] DOB: 27-Apr-1950  Referred by: Glendale Chard, MD Reason for referral : Chronic Care Management (RQ #2 Initial Call - DM/HTN)   [name] is a 69 y.o. year old female who is a primary care patient of Glendale Chard, MD. The CCM team was consulted for assistance with chronic disease management and care coordination needs related to HTN, HLD, DMII and CKD Stage IV  Review of patient status, including review of consultants reports, relevant laboratory and other test results, and collaboration with appropriate care team members and the patient's provider was performed as part of comprehensive patient evaluation and provision of chronic care management services.    SDOH (Social Determinants of Health) assessments performed: No See Care Plan activities for detailed interventions related to Holly Peters initial outbound CCM RN CM call to patient to assess for CCM needs.     Medications: Outpatient Encounter Medications as of 03/21/2019  Medication Sig  . JANUVIA 25 MG tablet TAKE 1 TABLET DAILY  . alendronate (FOSAMAX) 70 MG tablet TAKE 1 TABLET EVERY WEEK IN THE MORNING AT LEAST 30 MINUTES BEFORE FIRST FOOD, BEVERAGE OR MEDICATION OF DAY  . allopurinol (ZYLOPRIM) 100 MG tablet Take 1 tablet (100 mg total) by mouth daily.  . Cholecalciferol (VITAMIN D) 125 MCG (5000 UT) CAPS Take by mouth.  Holly Peters glucose blood (FREESTYLE LITE) test strip USE TO CHECK BLOOD SUGAR ONCE DAILY  . ibuprofen (ADVIL,MOTRIN) 400 MG tablet Take 400 mg by mouth every 6 (six) hours as needed.  . Psyllium (METAMUCIL FIBER PO) Take by mouth.  . simvastatin (ZOCOR) 20 MG tablet TAKE 1 TABLET DAILY  . [DISCONTINUED] diltiazem (CARDIZEM CD) 240 MG 24 hr capsule TAKE 1 CAPSULE DAILY   No facility-administered encounter medications on file as of 03/21/2019.     Objective:  Lab Results  Component Value Date   HGBA1C  7.7 (H) 02/12/2019   HGBA1C 6.4 (H) 10/11/2018   HGBA1C 5.9 (H) 05/30/2018   Lab Results  Component Value Date   MICROALBUR 30 11/21/2018   LDLCALC 65 10/11/2018   CREATININE 2.13 (H) 02/12/2019   BP Readings from Last 3 Encounters:  02/12/19 (!) 144/78  11/21/18 138/80  11/17/18 (!) 146/84    Goals Addressed      Patient Stated   . "to get my BP down a Amberlee Garvey" (pt-stated)       CARE PLAN ENTRY (see longtitudinal plan of care for additional care plan information)  Current Barriers:  Holly Peters Knowledge Deficits related to disease process and Self Health management of HTN . Chronic Disease Management support and education needs related to Essential Hypertension, CKD IV, DM II, HLD  Nurse Case Manager Clinical Goal(s):  Holly Peters Over the next 90 days, patient will work with the CCM team and PCP to address needs related to disease education and support for improved Self management of HTN  CCM RN CM Interventions:  03/22/19 call completed with patient  . Evaluation of current treatment plan related to HTN and patient's adherence to plan as established by provider. . Provided education to patient re: target BP <130/80: Reviewed patient BP's and Determined patient is not at goal BP; Educated on short and long term risks of HBP . Reviewed medications with patient and discussed indication, dosage and frequency of prescribed antihypertensives; patient reports adherence w/o noted SE . Collaborated with embedded Pharm D regarding patient's current BP regimen; discussed patient  is not at goal; Discussed patient now has StageIV CKD . Discussed plans with patient for ongoing care management follow up and provided patient with direct contact information for care management team . Advised patient, providing education and rationale, to monitor blood pressure daily and record, calling the CCM team and or PCP for findings outside established parameters.   Patient Self Care Activities:  . Self administers  medications as prescribed . Attends all scheduled provider appointments . Calls pharmacy for medication refills . Performs ADL's independently . Performs IADL's independently . Calls provider office for new concerns or questions  Initial goal documentation     . "to maintain or improve my kideny function" (pt-stated)       CARE PLAN ENTRY (see longtitudinal plan of care for additional care plan information)  Current Barriers:  Holly Peters Knowledge Deficits related to disease process and Self Health management of DM, CKD . Chronic Disease Management support and education needs related to CKD IV, DM II, HTN, HLD  Nurse Case Manager Clinical Goal(s):  Holly Peters Over the next 30 days, patient will maintain or improve current GFR of 27 and or will maintain or improve current BUN 33, Creatinine of 2.13 (obtained on 02/12/19)  . Over the next 90 days, patient will work with the CCM team and or PCP to address needs related to disease education and support to improve Self management of DM and CKD  CCM RN CM Interventions:  03/22/19 call completed with patient  . Evaluation of current treatment plan related to DM II, CKD IV and patient's adherence to plan as established by provider. . Provided education to patient re: current A1C of 7.7 obtained on 02/12/19; discussed this is an increase from 6.4; Educated on daily glycemic control of FBS 80-130, after meals <190; Educated on importance of getting better control of DM to help reduce risk of complications including worsening of CKD; Educated patient on the stages of CKD; Reviewed Stage IV and how to maintain and or improve this condition; Reviewed most current values; Determined patient is established with Nephrologist, Dr. Lyda Kalata is managing . Reviewed medications with patient and discussed indication, dosage and frequency of prescribed antidiabetic medication, patient reports adherence w/o noted SE; Reviewed and discussed past use of Januvia but d/c due to  worsening renal impairment . Collaborated with embedded Pharm D Lottie Dawson regarding patient current medication regimen for DM with poor control and concerns for worsening CKD . Discussed plans with patient for ongoing care management follow up and provided patient with direct contact information for care management team . Provided patient with printed educational materials related to Diabetes Management using the Plate Method, Diabetes Zone Safety Tool, Carb Counting, Carb Choices; Stages of Chronic Kidney Disease; 6 Tips to be Water Wise . Advised patient, providing education and rationale, to check cbg daily before meals and record, calling the CCM team and or PCP for findings outside established parameters.    Patient Self Care Activities:  . Self administers medications as prescribed . Attends all scheduled provider appointments . Calls pharmacy for medication refills . Performs ADL's independently . Performs IADL's independently . Calls provider office for new concerns or questions  Initial goal documentation       Plan:   Telephone follow up appointment with care management team member scheduled for: 04/19/19  Barb Merino, RN, BSN, CCM Care Management Coordinator [place] [facility]  Direct Phone: [phone]

## 2019-03-29 ENCOUNTER — Encounter: Payer: Self-pay | Admitting: Internal Medicine

## 2019-03-29 NOTE — Progress Notes (Signed)
  Chronic Care Management   Outreach Note  03/26/2019 Name: Holly Peters MRN: [id number] DOB: [birth year]  Referred by: Glendale Chard, MD Reason for referral : Chronic Care Management and Diabetes   An unsuccessful telephone outreach was attempted today. The patient was referred to the case management team for assistance with care management and care coordination.  Message sent to PCP regarding therapeutic regimen.  May consider adding low-dose GLP-1 to control diabetes and provide renal/cardioprotection.  SIGNATURE Regina Eck, PharmD, BCPS Clinical Pharmacist, [facility] [place]: [phone]

## 2019-04-02 ENCOUNTER — Encounter: Payer: Self-pay | Admitting: Internal Medicine

## 2019-04-10 ENCOUNTER — Telehealth: Payer: Medicare Other

## 2019-04-10 ENCOUNTER — Other Ambulatory Visit: Payer: Self-pay

## 2019-04-10 ENCOUNTER — Ambulatory Visit (INDEPENDENT_AMBULATORY_CARE_PROVIDER_SITE_OTHER): Payer: Medicare Other

## 2019-04-10 DIAGNOSIS — I10 Essential (primary) hypertension: Secondary | ICD-10-CM

## 2019-04-10 DIAGNOSIS — I129 Hypertensive chronic kidney disease with stage 1 through stage 4 chronic kidney disease, or unspecified chronic kidney disease: Secondary | ICD-10-CM | POA: Diagnosis not present

## 2019-04-10 DIAGNOSIS — N183 Chronic kidney disease, stage 3 unspecified: Secondary | ICD-10-CM

## 2019-04-10 DIAGNOSIS — E1122 Type 2 diabetes mellitus with diabetic chronic kidney disease: Secondary | ICD-10-CM

## 2019-04-10 DIAGNOSIS — E7800 Pure hypercholesterolemia, unspecified: Secondary | ICD-10-CM | POA: Diagnosis not present

## 2019-04-11 ENCOUNTER — Ambulatory Visit: Payer: Self-pay | Admitting: *Deleted

## 2019-04-11 NOTE — Chronic Care Management (AMB) (Signed)
  Chronic Care Management   Note  04/11/2019 Name: Holly Peters MRN: [id number] DOB: [birth year]  [place] referral made for assistance with medication management.   Follow up plan: [place] follow up.  [place] Management Coordinator Direct Dial:  [phone]  Fax: [phone]

## 2019-04-12 NOTE — Chronic Care Management (AMB) (Signed)
Chronic Care Management   Follow Up Note   04/10/2019 Name: Holly Peters MRN: [id number] DOB: [birth year]  Referred by: Glendale Chard, MD Reason for referral : No chief complaint on file.   Holly Peters is a 69 y.o. year old female who is a primary care patient of Glendale Chard, MD. The CCM team was consulted for assistance with chronic disease management and care coordination needs.    Review of patient status, including review of consultants reports, relevant laboratory and other test results, and collaboration with appropriate care team members and the patient's provider was performed as part of comprehensive patient evaluation and provision of chronic care management services.    SDOH (Social Determinants of Health) assessments performed: No See Care Plan activities for detailed interventions related to [place])   Inbound call received from patient regarding her f/u with pharmacy.     Outpatient Encounter Medications as of 04/10/2019  Medication Sig  . telmisartan-hydrochlorothiazide (MICARDIS HCT) 80-25 MG tablet Take 1 tablet by mouth daily.  [name] alendronate (FOSAMAX) 70 MG tablet TAKE 1 TABLET EVERY WEEK IN THE MORNING AT LEAST 30 MINUTES BEFORE FIRST FOOD, BEVERAGE OR MEDICATION OF DAY  . allopurinol (ZYLOPRIM) 100 MG tablet Take 1 tablet (100 mg total) by mouth daily.  . Cholecalciferol (VITAMIN D) 125 MCG (5000 UT) CAPS Take by mouth.  . diltiazem (CARDIZEM CD) 240 MG 24 hr capsule TAKE 1 CAPSULE DAILY  . glucose blood (FREESTYLE LITE) test strip USE TO CHECK BLOOD SUGAR ONCE DAILY  . ibuprofen (ADVIL,MOTRIN) 400 MG tablet Take 400 mg by mouth every 6 (six) hours as needed.  [name] JANUVIA 25 MG tablet TAKE 1 TABLET DAILY  . Psyllium (METAMUCIL FIBER PO) Take by mouth.  . simvastatin (ZOCOR) 20 MG tablet TAKE 1 TABLET DAILY   No facility-administered encounter medications on file as of 04/10/2019.     Objective:  Lab Results  Component Value Date   HGBA1C 7.7 (H)  02/12/2019   HGBA1C 6.4 (H) 10/11/2018   HGBA1C 5.9 (H) 05/30/2018   Lab Results  Component Value Date   MICROALBUR 30 11/21/2018   LDLCALC 65 10/11/2018   CREATININE 2.13 (H) 02/12/2019   BP Readings from Last 3 Encounters:  02/12/19 (!) 144/78  11/21/18 138/80  11/17/18 (!) 146/84    Goals Addressed      Patient Stated   . "to maintain or improve my kideny function" (pt-stated)       [place] (see longtitudinal plan of care for additional care plan information)  Current Barriers:  [name] Knowledge Deficits related to disease process and Self Health management of DM, CKD . Chronic Disease Management support and education needs related to CKD IV, DM II, HTN, HLD  Nurse Case Manager Clinical Goal(s):  [name] Over the next 30 days, patient will maintain or improve current GFR of 27 and or will maintain or improve current BUN 33, Creatinine of 2.13 (obtained on 02/12/19)  . Over the next 90 days, patient will work with the CCM team and or PCP to address needs related to disease education and support to improve Self management of DM and CKD  CCM RN CM Interventions:  04/10/19 Inbound call completed with patient  . Determined patient has not been notified by the embedded Pharm D to review her DM medication regimen . Reviewed chart and noted embedded Pharm D Lottie Dawson was unable to reach patient at last attempt by phone . Sent [facility] cental pharmacy referral requesting evaluation of  patient's current pharmacological DM regimen   . Determined there's no change in patient's status since last CCM RN CM contact . Evaluation of current treatment plan related to DM II, CKD IV and patient's adherence to plan as established by provider. . Discussed plans with patient for ongoing care management follow up and provided patient with direct contact information for care management team . Advised patient, providing education and rationale, to check cbg daily before meals and record, calling the CCM  team and or PCP for findings outside established parameters.    Patient Self Care Activities:  . Self administers medications as prescribed . Attends all scheduled provider appointments . Calls pharmacy for medication refills . Performs ADL's independently . Performs IADL's independently . Calls provider office for new concerns or questions  Please see past updates related to this goal by clicking on the "Past Updates" button in the selected goal        Plan:   The care management team will reach out to the patient again over the next 7-14 days.   Barb Merino, RN, BSN, CCM Care Management Coordinator [place] [facility]  Direct Phone: [phone]

## 2019-04-12 NOTE — Patient Instructions (Signed)
Visit Information  Goals Addressed      Patient Stated   . "to maintain or improve my kideny function" (pt-stated)       CARE PLAN ENTRY (see longtitudinal plan of care for additional care plan information)  Current Barriers:  [name] Knowledge Deficits related to disease process and Self Health management of DM, CKD . Chronic Disease Management support and education needs related to CKD IV, DM II, HTN, HLD  Nurse Case Manager Clinical Goal(s):  [name] Over the next 30 days, patient will maintain or improve current GFR of 27 and or will maintain or improve current BUN 33, Creatinine of 2.13 (obtained on 02/12/19)  . Over the next 90 days, patient will work with the CCM team and or PCP to address needs related to disease education and support to improve Self management of DM and CKD  CCM RN CM Interventions:  04/10/19 Inbound call completed with patient  . Determined patient has not been notified by the embedded Pharm D to review her DM medication regimen . Reviewed chart and noted embedded Pharm D Lottie Dawson was unable to reach patient at last attempt by phone . Sent [facility] cental pharmacy referral requesting evaluation of patient's current pharmacological DM regimen   . Determined there's no change in patient's status since last CCM RN CM contact . Evaluation of current treatment plan related to DM II, CKD IV and patient's adherence to plan as established by provider. . Discussed plans with patient for ongoing care management follow up and provided patient with direct contact information for care management team . Advised patient, providing education and rationale, to check cbg daily before meals and record, calling the CCM team and or PCP for findings outside established parameters.    Patient Self Care Activities:  . Self administers medications as prescribed . Attends all scheduled provider appointments . Calls pharmacy for medication refills . Performs ADL's independently . Performs IADL's  independently . Calls provider office for new concerns or questions  Please see past updates related to this goal by clicking on the "Past Updates" button in the selected goal        Patient verbalizes understanding of instructions provided today.   The care management team will reach out to the patient again over the next 7-14 days.   Barb Merino, RN, BSN, CCM Care Management Coordinator [place] [facility]  Direct Phone: [phone]

## 2019-04-13 ENCOUNTER — Ambulatory Visit: Payer: Self-pay | Admitting: Pharmacist

## 2019-04-19 ENCOUNTER — Telehealth: Payer: Self-pay

## 2019-04-26 ENCOUNTER — Other Ambulatory Visit: Payer: Self-pay | Admitting: Internal Medicine

## 2019-05-01 ENCOUNTER — Other Ambulatory Visit: Payer: Self-pay

## 2019-05-01 ENCOUNTER — Ambulatory Visit: Payer: Medicare Other

## 2019-05-01 VITALS — BP 126/74 | HR 83 | Temp 97.7°F | Ht 61.4 in | Wt 144.4 lb

## 2019-05-01 DIAGNOSIS — N183 Chronic kidney disease, stage 3 unspecified: Secondary | ICD-10-CM

## 2019-05-01 DIAGNOSIS — E1122 Type 2 diabetes mellitus with diabetic chronic kidney disease: Secondary | ICD-10-CM

## 2019-05-01 MED ORDER — TRULICITY 0.75 MG/0.5ML ~~LOC~~ SOAJ: 0.7500 mg | SUBCUTANEOUS | 4 refills | Status: DC

## 2019-05-01 NOTE — Progress Notes (Signed)
Pt here today for Trulicity training she will be starting off on 0.75mg /0.65ml once a wk per [facility] Prescription sent

## 2019-05-02 ENCOUNTER — Telehealth: Payer: Self-pay | Admitting: Internal Medicine

## 2019-05-02 NOTE — Chronic Care Management (AMB) (Signed)
  Chronic Care Management   Outreach Note  05/02/2019 Name: Holly Peters MRN: [id number] DOB: October 24, 1950  [name] is a 69 y.o. year old female who is a primary care patient of Glendale Chard, MD. I reached out to [employer] by phone today in response to a referral sent by Ms. Guiselle H Fry's health plan.     An unsuccessful telephone outreach was attempted today to schedule appointment with Pharmacist. The patient was referred to the case management team for assistance with care management and care coordination.   Follow Up Plan: The care management team will reach out to the patient again over the next 7 days. If patient returns call to provider office, please advise to call [place] at [phone].  [place], [place] [address] Direct Dial: [phone] [email or web address]: [email or web address]

## 2019-05-07 DIAGNOSIS — N183 Chronic kidney disease, stage 3 unspecified: Secondary | ICD-10-CM | POA: Diagnosis not present

## 2019-05-07 DIAGNOSIS — M109 Gout, unspecified: Secondary | ICD-10-CM | POA: Diagnosis not present

## 2019-05-07 DIAGNOSIS — I129 Hypertensive chronic kidney disease with stage 1 through stage 4 chronic kidney disease, or unspecified chronic kidney disease: Secondary | ICD-10-CM | POA: Diagnosis not present

## 2019-05-07 DIAGNOSIS — E7800 Pure hypercholesterolemia, unspecified: Secondary | ICD-10-CM | POA: Diagnosis not present

## 2019-05-07 DIAGNOSIS — E1122 Type 2 diabetes mellitus with diabetic chronic kidney disease: Secondary | ICD-10-CM | POA: Diagnosis not present

## 2019-05-07 NOTE — Chronic Care Management (AMB) (Signed)
  Chronic Care Management   Note  05/07/2019 Name: Holly Peters MRN: [id number] DOB: 04-14-1950  [name] is a 69 y.o. year old female who is a primary care patient of Glendale Chard, MD and is actively engaged with the care management team. I reached out to [employer] by phone today to assist with scheduling an initial visit with the Pharmacist.  Follow up plan: Patient does not need to speak with Pharmacy at this time she has been given contact information to call with any questions or concerns.   [place], [place] [address] Direct Dial: [phone] [email or web address]: [email or web address]

## 2019-05-10 ENCOUNTER — Telehealth: Payer: Medicare Other

## 2019-05-10 ENCOUNTER — Ambulatory Visit: Payer: Self-pay

## 2019-05-10 ENCOUNTER — Other Ambulatory Visit: Payer: Self-pay

## 2019-05-10 DIAGNOSIS — E1122 Type 2 diabetes mellitus with diabetic chronic kidney disease: Secondary | ICD-10-CM

## 2019-05-10 DIAGNOSIS — I129 Hypertensive chronic kidney disease with stage 1 through stage 4 chronic kidney disease, or unspecified chronic kidney disease: Secondary | ICD-10-CM

## 2019-05-10 DIAGNOSIS — E7800 Pure hypercholesterolemia, unspecified: Secondary | ICD-10-CM

## 2019-05-10 DIAGNOSIS — I10 Essential (primary) hypertension: Secondary | ICD-10-CM

## 2019-05-10 DIAGNOSIS — N183 Chronic kidney disease, stage 3 unspecified: Secondary | ICD-10-CM

## 2019-05-11 NOTE — Chronic Care Management (AMB) (Signed)
  Chronic Care Management   Follow Up Note   5/6//2021 Name: Holly Peters MRN: [id number] DOB: 1950-06-14  Referred by: Glendale Chard, MD Reason for referral : Chronic Care Management (FU Call - DM/CKD/HLD/HTN)   Holly Peters is a 69 y.o. year old female who is a primary care patient of Glendale Chard, MD. The CCM team was consulted for assistance with chronic disease management and care coordination needs.    Review of patient status, including review of consultants reports, relevant laboratory and other test results, and collaboration with appropriate care team members and the patient's provider was performed as part of comprehensive patient evaluation and provision of chronic care management services.    Chart Review completed in preparation to contact patient.   Outpatient Encounter Medications as of 05/10/2019  Medication Sig  . alendronate (FOSAMAX) 70 MG tablet TAKE 1 TABLET EVERY WEEK IN THE MORNING AT LEAST 30 MINUTES BEFORE FIRST FOOD, BEVERAGE OR MEDICATION OF DAY  . allopurinol (ZYLOPRIM) 100 MG tablet Take 1 tablet (100 mg total) by mouth daily.  . Cholecalciferol (VITAMIN D) 125 MCG (5000 UT) CAPS Take by mouth.  . diltiazem (CARDIZEM CD) 240 MG 24 hr capsule TAKE 1 CAPSULE DAILY  . Dulaglutide (TRULICITY) 8.63 OT/7.7NH SOPN Inject 0.75 mg into the skin once a week.  [name] glucose blood (FREESTYLE LITE) test strip USE TO CHECK BLOOD SUGAR ONCE DAILY  . ibuprofen (ADVIL,MOTRIN) 400 MG tablet Take 400 mg by mouth every 6 (six) hours as needed.  [name] JANUVIA 25 MG tablet TAKE 1 TABLET DAILY  . Psyllium (METAMUCIL FIBER PO) Take by mouth.  . simvastatin (ZOCOR) 20 MG tablet TAKE 1 TABLET DAILY  . telmisartan-hydrochlorothiazide (MICARDIS HCT) 80-25 MG tablet Take 1 tablet by mouth daily.   No facility-administered encounter medications on file as of 05/10/2019.    Plan:   Telephone follow up appointment with care management team member scheduled for: 05/16/19  Barb Merino, RN, BSN, CCM Care Management Coordinator [place] [facility]  Direct Phone: [phone]

## 2019-05-16 ENCOUNTER — Telehealth: Payer: Self-pay

## 2019-05-31 ENCOUNTER — Other Ambulatory Visit: Payer: Self-pay

## 2019-05-31 ENCOUNTER — Encounter: Payer: Self-pay | Admitting: Internal Medicine

## 2019-05-31 ENCOUNTER — Ambulatory Visit (INDEPENDENT_AMBULATORY_CARE_PROVIDER_SITE_OTHER): Payer: Medicare Other | Admitting: Internal Medicine

## 2019-05-31 VITALS — BP 136/78 | HR 89 | Temp 98.2°F | Ht 61.4 in | Wt 141.0 lb

## 2019-05-31 DIAGNOSIS — Z6826 Body mass index (BMI) 26.0-26.9, adult: Secondary | ICD-10-CM

## 2019-05-31 DIAGNOSIS — E1122 Type 2 diabetes mellitus with diabetic chronic kidney disease: Secondary | ICD-10-CM

## 2019-05-31 DIAGNOSIS — I129 Hypertensive chronic kidney disease with stage 1 through stage 4 chronic kidney disease, or unspecified chronic kidney disease: Secondary | ICD-10-CM | POA: Diagnosis not present

## 2019-05-31 DIAGNOSIS — E7800 Pure hypercholesterolemia, unspecified: Secondary | ICD-10-CM

## 2019-05-31 DIAGNOSIS — N184 Chronic kidney disease, stage 4 (severe): Secondary | ICD-10-CM

## 2019-05-31 MED ORDER — TRULICITY 0.75 MG/0.5ML ~~LOC~~ SOAJ: 0.7500 mg | SUBCUTANEOUS | 4 refills | Status: DC

## 2019-05-31 NOTE — Progress Notes (Signed)
This visit occurred during the SARS-CoV-2 public health emergency.  Safety protocols were in place, including screening questions prior to the visit, additional usage of staff PPE, and extensive cleaning of exam room while observing appropriate contact time as indicated for disinfecting solutions.  Subjective:     Patient ID: Holly Peters , female    DOB: August 11, 1950 , 69 y.o.   MRN: [id number]   Chief Complaint  Patient presents with  . Diabetes    HPI  She reports for diabetes check today. She was started on Trulicity 4.31VQ once weekly after her last visit. The Januvia was discontinued due to her renal function. She has not had any issues with the medication.   Diabetes She presents for her follow-up diabetic visit. She has type 2 diabetes mellitus. Her disease course has been stable. There are no hypoglycemic associated symptoms. Pertinent negatives for diabetes include no blurred vision and no chest pain. There are no hypoglycemic complications. Risk factors for coronary artery disease include diabetes mellitus, dyslipidemia, hypertension, sedentary lifestyle and post-menopausal. Her weight is stable. She is following a diabetic diet. Her breakfast blood glucose is taken between 8-9 am. Her breakfast blood glucose range is generally 90-110 mg/dl. Eye exam is current.  Hypertension This is a chronic problem. The current episode started more than 1 year ago. The problem has been gradually improving since onset. The problem is controlled. Pertinent negatives include no blurred vision or chest pain.     Past Medical History:  Diagnosis Date  . Diabetes mellitus without complication ([place])   . Hyperlipemia   . Hypertension      Family History  Problem Relation Age of Onset  . Diabetes Mother   . Healthy Father      Current Outpatient Medications:  .  allopurinol (ZYLOPRIM) 100 MG tablet, Take 1 tablet (100 mg total) by mouth daily., Disp: 90 tablet, Rfl: 1 .   Cholecalciferol (VITAMIN D) 125 MCG (5000 UT) CAPS, Take by mouth., Disp: , Rfl:  .  diltiazem (CARDIZEM CD) 240 MG 24 hr capsule, TAKE 1 CAPSULE DAILY, Disp: 90 capsule, Rfl: 3 .  Dulaglutide (TRULICITY) 0.08 QP/6.1PJ SOPN, Inject 0.75 mg into the skin once a week., Disp: 2 mL, Rfl: 4 .  glucose blood (FREESTYLE LITE) test strip, USE TO CHECK BLOOD SUGAR ONCE DAILY, Disp: 100 each, Rfl: 3 .  ibuprofen (ADVIL,MOTRIN) 400 MG tablet, Take 400 mg by mouth every 6 (six) hours as needed., Disp: , Rfl:  .  Psyllium (METAMUCIL FIBER PO), Take by mouth., Disp: , Rfl:  .  simvastatin (ZOCOR) 20 MG tablet, TAKE 1 TABLET DAILY, Disp: 90 tablet, Rfl: 3 .  telmisartan-hydrochlorothiazide (MICARDIS HCT) 80-25 MG tablet, Take 1 tablet by mouth daily., Disp: , Rfl:    No Known Allergies   Review of Systems  Constitutional: Negative.   Eyes: Negative for blurred vision.  Respiratory: Negative.   Cardiovascular: Negative.  Negative for chest pain.  Gastrointestinal: Negative.   Neurological: Negative.   Psychiatric/Behavioral: Negative.      Today's Vitals   05/31/19 1412  BP: 136/78  Pulse: 89  Temp: 98.2 F (36.8 C)  TempSrc: Oral  Weight: 141 lb (64 kg)  Height: 5' 1.4" (1.56 m)  PainSc: 0-No pain   Body mass index is 26.3 kg/m.   Objective:  Physical Exam Vitals and nursing note reviewed.  Constitutional:      Appearance: Normal appearance.  HENT:     Head: Normocephalic and atraumatic.  Cardiovascular:  Rate and Rhythm: Normal rate and regular rhythm.     Heart sounds: Normal heart sounds.  Pulmonary:     Effort: Pulmonary effort is normal.     Breath sounds: Normal breath sounds.  Skin:    General: Skin is warm.  Neurological:     General: No focal deficit present.     Mental Status: She is alert.  Psychiatric:        Mood and Affect: Mood normal.        Behavior: Behavior normal.         Assessment And Plan:     1. Type 2 diabetes mellitus with stage 4 chronic  kidney disease, without long-term current use of insulin (HCC)  Chronic, she will continue with Trulicity 2.82SU once weekly for now. I hesitate to increase this dose b/c I do not want her to lose anymore weight. I will make further recommendations once her labs are available for review.   - Hemoglobin A1c - Lipid panel - CMP14+EGFR  2. Parenchymal renal hypertension, stage 1 through stage 4 or unspecified chronic kidney disease  Chronic, fair control. She will continue with current meds for now. Encouraged to avoid adding salt to her foods.   3. Pure hypercholesterolemia  Chronic. I will check non-fasting lipid panel today. She will continue with current meds for now.   4. Body mass index (BMI) of 26.0-26.9 in adult  She has lost 3 pounds since her last visit. Her weight is stable for her demographic.   Maximino Greenland, MD    THE PATIENT IS ENCOURAGED TO PRACTICE SOCIAL DISTANCING DUE TO THE COVID-19 PANDEMIC.

## 2019-05-31 NOTE — Patient Instructions (Signed)
Diabetes Mellitus and Exercise Exercising regularly is important for your overall health, especially when you have diabetes (diabetes mellitus). Exercising is not only about losing weight. It has many other health benefits, such as increasing muscle strength and bone density and reducing body fat and stress. This leads to improved fitness, flexibility, and endurance, all of which result in better overall health. Exercise has additional benefits for people with diabetes, including:  Reducing appetite.  Helping to lower and control blood glucose.  Lowering blood pressure.  Helping to control amounts of fatty substances (lipids) in the blood, such as cholesterol and triglycerides.  Helping the body to respond better to insulin (improving insulin sensitivity).  Reducing how much insulin the body needs.  Decreasing the risk for heart disease by: ? Lowering cholesterol and triglyceride levels. ? Increasing the levels of good cholesterol. ? Lowering blood glucose levels. What is my activity plan? Your health care provider or certified diabetes educator can help you make a plan for the type and frequency of exercise (activity plan) that works for you. Make sure that you:  Do at least 150 minutes of moderate-intensity or vigorous-intensity exercise each week. This could be brisk walking, biking, or water aerobics. ? Do stretching and strength exercises, such as yoga or weightlifting, at least 2 times a week. ? Spread out your activity over at least 3 days of the week.  Get some form of physical activity every day. ? Do not go more than 2 days in a row without some kind of physical activity. ? Avoid being inactive for more than 30 minutes at a time. Take frequent breaks to walk or stretch.  Choose a type of exercise or activity that you enjoy, and set realistic goals.  Start slowly, and gradually increase the intensity of your exercise over time. What do I need to know about managing my  diabetes?   Check your blood glucose before and after exercising. ? If your blood glucose is 240 mg/dL (13.3 mmol/L) or higher before you exercise, check your urine for ketones. If you have ketones in your urine, do not exercise until your blood glucose returns to normal. ? If your blood glucose is 100 mg/dL (5.6 mmol/L) or lower, eat a snack containing 15-20 grams of carbohydrate. Check your blood glucose 15 minutes after the snack to make sure that your level is above 100 mg/dL (5.6 mmol/L) before you start your exercise.  Know the symptoms of low blood glucose (hypoglycemia) and how to treat it. Your risk for hypoglycemia increases during and after exercise. Common symptoms of hypoglycemia can include: ? Hunger. ? Anxiety. ? Sweating and feeling clammy. ? Confusion. ? Dizziness or feeling light-headed. ? Increased heart rate or palpitations. ? Blurry vision. ? Tingling or numbness around the mouth, lips, or tongue. ? Tremors or shakes. ? Irritability.  Keep a rapid-acting carbohydrate snack available before, during, and after exercise to help prevent or treat hypoglycemia.  Avoid injecting insulin into areas of the body that are going to be exercised. For example, avoid injecting insulin into: ? The arms, when playing tennis. ? The legs, when jogging.  Keep records of your exercise habits. Doing this can help you and your health care provider adjust your diabetes management plan as needed. Write down: ? Food that you eat before and after you exercise. ? Blood glucose levels before and after you exercise. ? The type and amount of exercise you have done. ? When your insulin is expected to peak, if you use   insulin. Avoid exercising at times when your insulin is peaking.  When you start a new exercise or activity, work with your health care provider to make sure the activity is safe for you, and to adjust your insulin, medicines, or food intake as needed.  Drink plenty of water while  you exercise to prevent dehydration or heat stroke. Drink enough fluid to keep your urine clear or pale yellow. Summary  Exercising regularly is important for your overall health, especially when you have diabetes (diabetes mellitus).  Exercising has many health benefits, such as increasing muscle strength and bone density and reducing body fat and stress.  Your health care provider or certified diabetes educator can help you make a plan for the type and frequency of exercise (activity plan) that works for you.  When you start a new exercise or activity, work with your health care provider to make sure the activity is safe for you, and to adjust your insulin, medicines, or food intake as needed. This information is not intended to replace advice given to you by your health care provider. Make sure you discuss any questions you have with your health care provider. Document Revised: 07/15/2016 Document Reviewed: 06/02/2015 Elsevier Patient Education  2020 Elsevier Inc.  

## 2019-06-01 ENCOUNTER — Telehealth: Payer: Self-pay

## 2019-06-01 LAB — CMP14+EGFR
ALT: 16 IU/L (ref 0–32)
AST: 14 IU/L (ref 0–40)
Albumin/Globulin Ratio: 1.8 (ref 1.2–2.2)
Albumin: 4.8 g/dL (ref 3.8–4.8)
Alkaline Phosphatase: 59 IU/L (ref 48–121)
BUN/Creatinine Ratio: 12 (ref 12–28)
BUN: 27 mg/dL (ref 8–27)
Bilirubin Total: 0.3 mg/dL (ref 0.0–1.2)
CO2: 22 mmol/L (ref 20–29)
Calcium: 10.2 mg/dL (ref 8.7–10.3)
Chloride: 106 mmol/L (ref 96–106)
Creatinine, Ser: 2.24 mg/dL — ABNORMAL HIGH (ref 0.57–1.00)
GFR calc Af Amer: 25 mL/min/{1.73_m2} — ABNORMAL LOW (ref >59)
GFR calc non Af Amer: 22 mL/min/{1.73_m2} — ABNORMAL LOW (ref >59)
Globulin, Total: 2.6 g/dL (ref 1.5–4.5)
Glucose: 117 mg/dL — ABNORMAL HIGH (ref 65–99)
Potassium: 4 mmol/L (ref 3.5–5.2)
Sodium: 142 mmol/L (ref 134–144)
Total Protein: 7.4 g/dL (ref 6.0–8.5)

## 2019-06-01 LAB — LIPID PANEL
Chol/HDL Ratio: 3.6 ratio (ref 0.0–4.4)
Cholesterol, Total: 182 mg/dL (ref 100–199)
HDL: 50 mg/dL (ref >39)
LDL Chol Calc (NIH): 83 mg/dL (ref 0–99)
Triglycerides: 300 mg/dL — ABNORMAL HIGH (ref 0–149)
VLDL Cholesterol Cal: 49 mg/dL — ABNORMAL HIGH (ref 5–40)

## 2019-06-01 LAB — HEMOGLOBIN A1C
Est. average glucose Bld gHb Est-mCnc: 163 mg/dL
Hgb A1c MFr Bld: 7.3 % — ABNORMAL HIGH (ref 4.8–5.6)

## 2019-06-08 ENCOUNTER — Ambulatory Visit: Payer: Self-pay

## 2019-06-08 DIAGNOSIS — N184 Chronic kidney disease, stage 4 (severe): Secondary | ICD-10-CM

## 2019-06-08 DIAGNOSIS — I129 Hypertensive chronic kidney disease with stage 1 through stage 4 chronic kidney disease, or unspecified chronic kidney disease: Secondary | ICD-10-CM

## 2019-06-08 NOTE — Chronic Care Management (AMB) (Signed)
  Chronic Care Management   Outreach Note  06/08/2019 Name: Holly Peters MRN: [id number] DOB: 1950/02/22  Referred by: Glendale Chard, MD Reason for referral : Care Coordination   SW placed an unsuccessful outbound call to the patient in an attempt to assist RN CM in assessment of goal progression. SW unable to leave voice message due to no voice mailbox being established at the time of today's call.  Follow Up Plan: Rescheduled RN CM telephonic outreach for June 24. No SW follow up planned at this time.   Daneen Schick, BSW, CDP Social Worker, Certified Dementia Practitioner [place] [facility] [facility] [phone]

## 2019-06-19 ENCOUNTER — Ambulatory Visit: Payer: Medicare Other | Admitting: Internal Medicine

## 2019-06-28 ENCOUNTER — Telehealth: Payer: Self-pay

## 2019-07-19 ENCOUNTER — Encounter: Payer: Self-pay | Admitting: Internal Medicine

## 2019-07-19 DIAGNOSIS — H04123 Dry eye syndrome of bilateral lacrimal glands: Secondary | ICD-10-CM | POA: Diagnosis not present

## 2019-07-19 DIAGNOSIS — H25813 Combined forms of age-related cataract, bilateral: Secondary | ICD-10-CM | POA: Diagnosis not present

## 2019-07-19 DIAGNOSIS — E119 Type 2 diabetes mellitus without complications: Secondary | ICD-10-CM | POA: Diagnosis not present

## 2019-07-19 LAB — HM DIABETES EYE EXAM

## 2019-08-01 ENCOUNTER — Other Ambulatory Visit: Payer: Self-pay | Admitting: Internal Medicine

## 2019-08-10 ENCOUNTER — Telehealth: Payer: Self-pay

## 2019-08-21 ENCOUNTER — Encounter: Payer: Self-pay | Admitting: Family Medicine

## 2019-08-21 ENCOUNTER — Other Ambulatory Visit: Payer: Self-pay

## 2019-08-21 ENCOUNTER — Ambulatory Visit: Payer: Self-pay

## 2019-08-21 ENCOUNTER — Ambulatory Visit (INDEPENDENT_AMBULATORY_CARE_PROVIDER_SITE_OTHER): Payer: Medicare Other | Admitting: Family Medicine

## 2019-08-21 VITALS — BP 154/81 | HR 83 | Ht 62.0 in | Wt 138.0 lb

## 2019-08-21 DIAGNOSIS — M76899 Other specified enthesopathies of unspecified lower limb, excluding foot: Secondary | ICD-10-CM | POA: Diagnosis not present

## 2019-08-21 DIAGNOSIS — M25562 Pain in left knee: Secondary | ICD-10-CM

## 2019-08-21 MED ORDER — PREDNISONE 5 MG PO TABS
ORAL_TABLET | ORAL | 0 refills | Status: DC
Start: 2019-08-21 — End: 2019-09-04

## 2019-08-21 NOTE — Patient Instructions (Signed)
Good to see you Please use ice as needed  Please try the prednisone and monitor your blood sugar  We may need to increase your allopurinol   Please send me a message in MyChart with any questions or updates.  Please see [place] back as needed.   --Dr. Raeford Razor

## 2019-08-21 NOTE — Assessment & Plan Note (Signed)
Significant hyperemia associated with the quad tendon as well as a prepatellar bursa.  Likely secondary to the gouty origin.  Could be associated with her worsening kidney function. -Prednisone.  Counseled on monitoring her blood sugar. -Counseled supportive care. -May need to increase allopurinol.

## 2019-08-21 NOTE — Progress Notes (Signed)
Holly Peters - 69 y.o. female MRN [id number]  Date of birth: 1950/03/19  SUBJECTIVE:  Including CC & ROS.  Chief Complaint  Patient presents with  . Knee Pain    left    Holly Peters is a 69 y.o. female that is presenting with acute left knee pain.  No prior history of knee pain.  No history of surgery.  Denies any inciting event.  She is having some swelling and redness and warmth over the anterior aspect of the knee.  Did take ibuprofen which does seem to help her symptoms.   Review of Systems See HPI   HISTORY: Past Medical, Surgical, Social, and Family History Reviewed & Updated per EMR.   Pertinent Historical Findings include:  Past Medical History:  Diagnosis Date  . Diabetes mellitus without complication ([place])   . Hyperlipemia   . Hypertension     Past Surgical History:  Procedure Laterality Date  . ABDOMINAL HYSTERECTOMY      Family History  Problem Relation Age of Onset  . Diabetes Mother   . Healthy Father     Social History   Socioeconomic History  . Marital status: Married    Spouse name: Not on file  . Number of children: Not on file  . Years of education: Not on file  . Highest education level: Not on file  Occupational History  . Occupation: retired  Tobacco Use  . Smoking status: Never Smoker  . Smokeless tobacco: Never Used  Vaping Use  . Vaping Use: Never used  Substance and Sexual Activity  . Alcohol use: No  . Drug use: No  . Sexual activity: Not on file  Other Topics Concern  . Not on file  Social History Narrative  . Not on file   Social Determinants of Health   Financial Resource Strain: Low Risk   . Difficulty of Paying Living Expenses: Not hard at all  Food Insecurity: No Food Insecurity  . Worried About Charity fundraiser in the Last Year: Never true  . Ran Out of Food in the Last Year: Never true  Transportation Needs: No Transportation Needs  . Lack of Transportation (Medical): No  . Lack of Transportation  (Non-Medical): No  Physical Activity: Insufficiently Active  . Days of Exercise per Week: 4 days  . Minutes of Exercise per Session: 30 min  Stress: No Stress Concern Present  . Feeling of Stress : Not at all  Social Connections:   . Frequency of Communication with Friends and Family:   . Frequency of Social Gatherings with Friends and Family:   . Attends Religious Services:   . Active Member of Clubs or Organizations:   . Attends Archivist Meetings:   [name] Marital Status:   Intimate Partner Violence: Not At Risk  . Fear of Current or Ex-Partner: No  . Emotionally Abused: No  . Physically Abused: No  . Sexually Abused: No     PHYSICAL EXAM:  VS: BP (!) 154/81   Pulse 83   Ht 5\' 2"  (1.575 m)   Wt 138 lb (62.6 kg)   BMI 25.24 kg/m  Physical Exam Gen: NAD, alert, cooperative with exam, well-appearing MSK:  Left knee: Obvious redness and warmth and swelling over the anterior aspect. Normal range of motion. No instability with valgus or varus stress testing. Neurovascularly intact  Limited ultrasound: Left knee:  Trace effusion in the suprapatellar pouch. Significant hyperemia of the quad tendon as well as the prepatellar bursa.  Areas of hyperechogenicity to suggest a gouty source. Normal-appearing patellar tendon. No cobblestoning of the soft tissue appreciated.  Summary: Findings are suggestive of an acute gouty flare.  Ultrasound and interpretation by Clearance Coots, MD    ASSESSMENT & PLAN:   Quadriceps tendinitis Significant hyperemia associated with the quad tendon as well as a prepatellar bursa.  Likely secondary to the gouty origin.  Could be associated with her worsening kidney function. -Prednisone.  Counseled on monitoring her blood sugar. -Counseled supportive care. -May need to increase allopurinol.

## 2019-09-04 ENCOUNTER — Other Ambulatory Visit: Payer: Self-pay

## 2019-09-04 ENCOUNTER — Encounter: Payer: Self-pay | Admitting: Internal Medicine

## 2019-09-04 ENCOUNTER — Ambulatory Visit (INDEPENDENT_AMBULATORY_CARE_PROVIDER_SITE_OTHER): Payer: Medicare Other | Admitting: Internal Medicine

## 2019-09-04 VITALS — BP 122/68 | HR 85 | Temp 97.9°F | Ht 61.6 in | Wt 137.0 lb

## 2019-09-04 DIAGNOSIS — M1A30X0 Chronic gout due to renal impairment, unspecified site, without tophus (tophi): Secondary | ICD-10-CM

## 2019-09-04 DIAGNOSIS — E1122 Type 2 diabetes mellitus with diabetic chronic kidney disease: Secondary | ICD-10-CM

## 2019-09-04 DIAGNOSIS — I129 Hypertensive chronic kidney disease with stage 1 through stage 4 chronic kidney disease, or unspecified chronic kidney disease: Secondary | ICD-10-CM

## 2019-09-04 DIAGNOSIS — M76899 Other specified enthesopathies of unspecified lower limb, excluding foot: Secondary | ICD-10-CM | POA: Diagnosis not present

## 2019-09-04 DIAGNOSIS — N184 Chronic kidney disease, stage 4 (severe): Secondary | ICD-10-CM | POA: Diagnosis not present

## 2019-09-04 DIAGNOSIS — Z6825 Body mass index (BMI) 25.0-25.9, adult: Secondary | ICD-10-CM | POA: Diagnosis not present

## 2019-09-04 MED ORDER — TELMISARTAN-HCTZ 80-25 MG PO TABS: 1.0000 | ORAL_TABLET | Freq: Every day | ORAL | 2 refills | Status: DC

## 2019-09-04 MED ORDER — ALLOPURINOL 100 MG PO TABS: 100.0000 mg | ORAL_TABLET | Freq: Every day | ORAL | 3 refills | Status: DC

## 2019-09-04 NOTE — Progress Notes (Signed)
I,Tianna Badgett,acting as a Education administrator for Maximino Greenland, MD.,have documented all relevant documentation on the behalf of Maximino Greenland, MD,as directed by  Maximino Greenland, MD while in the presence of Maximino Greenland, MD.  This visit occurred during the SARS-CoV-2 public health emergency.  Safety protocols were in place, including screening questions prior to the visit, additional usage of staff PPE, and extensive cleaning of exam room while observing appropriate contact time as indicated for disinfecting solutions.  Subjective:     Patient ID: Holly Peters , female    DOB: April 30, 1950 , 69 y.o.   MRN: [id number]   Chief Complaint  Patient presents with   Diabetes   Hypertension    HPI  She reports for diabetes check today. She is compliant with medication. She does not have any concerns at this time.   Diabetes She presents for her follow-up diabetic visit. She has type 2 diabetes mellitus. Her disease course has been stable. There are no hypoglycemic associated symptoms. Pertinent negatives for diabetes include no blurred vision and no chest pain. There are no hypoglycemic complications. Risk factors for coronary artery disease include diabetes mellitus, dyslipidemia, hypertension, sedentary lifestyle and post-menopausal. Her weight is stable. She is following a diabetic diet. Her breakfast blood glucose is taken between 8-9 am. Her breakfast blood glucose range is generally 90-110 mg/dl. Eye exam is current.  Hypertension This is a chronic problem. The current episode started more than 1 year ago. The problem has been gradually improving since onset. The problem is controlled. Pertinent negatives include no blurred vision or chest pain.     Past Medical History:  Diagnosis Date   Diabetes mellitus without complication ([place])    Hyperlipemia    Hypertension      Family History  Problem Relation Age of Onset   Diabetes Mother    Healthy Father      Current  Outpatient Medications:    allopurinol (ZYLOPRIM) 100 MG tablet, Take 1 tablet (100 mg total) by mouth daily., Disp: 90 tablet, Rfl: 3   Cholecalciferol (VITAMIN D) 125 MCG (5000 UT) CAPS, Take by mouth., Disp: , Rfl:    diltiazem (CARDIZEM CD) 240 MG 24 hr capsule, TAKE 1 CAPSULE DAILY, Disp: 90 capsule, Rfl: 3   Dulaglutide (TRULICITY) 5.05 LZ/7.6BH SOPN, Inject 0.75 mg into the skin once a week., Disp: 6 mL, Rfl: 4   FREESTYLE LITE test strip, USE TO CHECK BLOOD SUGAR ONCE DAILY, Disp: 100 each, Rfl: 3   ibuprofen (ADVIL,MOTRIN) 400 MG tablet, Take 400 mg by mouth every 6 (six) hours as needed., Disp: , Rfl:    Psyllium (METAMUCIL FIBER PO), Take by mouth., Disp: , Rfl:    simvastatin (ZOCOR) 20 MG tablet, TAKE 1 TABLET DAILY, Disp: 90 tablet, Rfl: 3   telmisartan-hydrochlorothiazide (MICARDIS HCT) 80-25 MG tablet, Take 1 tablet by mouth daily., Disp: 90 tablet, Rfl: 2   No Known Allergies   Review of Systems  Constitutional: Negative.   Eyes: Negative for blurred vision.  Respiratory: Negative.   Cardiovascular: Negative.  Negative for chest pain.  Gastrointestinal: Negative.   Neurological: Negative.      Today's Vitals   09/04/19 1001  BP: 122/68  Pulse: 85  Temp: 97.9 F (36.6 C)  TempSrc: Oral  Weight: 137 lb (62.1 kg)  Height: 5' 1.6" (1.565 m)  PainSc: 0-No pain   Body mass index is 25.38 kg/m.   Wt Readings from Last 3 Encounters:  09/04/19 137 lb (62.1  kg)  08/21/19 138 lb (62.6 kg)  05/31/19 141 lb (64 kg)    Objective:  Physical Exam Vitals and nursing note reviewed.  Constitutional:      Appearance: Normal appearance.  HENT:     Head: Normocephalic and atraumatic.  Cardiovascular:     Rate and Rhythm: Normal rate and regular rhythm.     Heart sounds: Normal heart sounds.  Pulmonary:     Effort: Pulmonary effort is normal.     Breath sounds: Normal breath sounds.  Skin:    General: Skin is warm.  Neurological:     General: No focal  deficit present.     Mental Status: She is alert.  Psychiatric:        Mood and Affect: Mood normal.        Behavior: Behavior normal.         Assessment And Plan:     1. Type 2 diabetes mellitus with stage 4 chronic kidney disease, without long-term current use of insulin (HCC) Comments: Chronic, I will check labs as listed below. Encouraged to f/u with Renal as scheduled. Will consider use of new medication to address DM/CKD.   2. Parenchymal renal hypertension, stage 1 through stage 4 or unspecified chronic kidney disease Comments: Chronic, well controlled. Will continue with current medication. Encouraged to avoid adding salt to her foods.  3. Quadriceps tendinitis Comments: Recently finished course of prednisone. Ortho note reviewed during her visit.  4. Chronic gout due to renal impairment without tophus, unspecified site Comments: Chronic. Refill of allopurinol was sent to the pharmacy. I will check uric acid level today.  - Uric acid  5. Body mass index (BMI) 25.0-25.9, adult Comments: Her weight is stable. Encouraged to aim for at least 30 minutes of exercise five days per week.      Patient was given opportunity to ask questions. Patient verbalized understanding of the plan and was able to repeat key elements of the plan. All questions were answered to their satisfaction.  Maximino Greenland, MD   I, Maximino Greenland, MD, have reviewed all documentation for this visit. The documentation on 09/04/19 for the exam, diagnosis, procedures, and orders are all accurate and complete.  THE PATIENT IS ENCOURAGED TO PRACTICE SOCIAL DISTANCING DUE TO THE COVID-19 PANDEMIC.

## 2019-09-04 NOTE — Patient Instructions (Addendum)
Are you a candidate for new medication that addresses diabetes and CKD?    Diabetes Mellitus and Exercise Exercising regularly is important for your overall health, especially when you have diabetes (diabetes mellitus). Exercising is not only about losing weight. It has many other health benefits, such as increasing muscle strength and bone density and reducing body fat and stress. This leads to improved fitness, flexibility, and endurance, all of which result in better overall health. Exercise has additional benefits for people with diabetes, including:  Reducing appetite.  Helping to lower and control blood glucose.  Lowering blood pressure.  Helping to control amounts of fatty substances (lipids) in the blood, such as cholesterol and triglycerides.  Helping the body to respond better to insulin (improving insulin sensitivity).  Reducing how much insulin the body needs.  Decreasing the risk for heart disease by: ? Lowering cholesterol and triglyceride levels. ? Increasing the levels of good cholesterol. ? Lowering blood glucose levels. What is my activity plan? Your health care provider or certified diabetes educator can help you make a plan for the type and frequency of exercise (activity plan) that works for you. Make sure that you:  Do at least 150 minutes of moderate-intensity or vigorous-intensity exercise each week. This could be brisk walking, biking, or water aerobics. ? Do stretching and strength exercises, such as yoga or weightlifting, at least 2 times a week. ? Spread out your activity over at least 3 days of the week.  Get some form of physical activity every day. ? Do not go more than 2 days in a row without some kind of physical activity. ? Avoid being inactive for more than 30 minutes at a time. Take frequent breaks to walk or stretch.  Choose a type of exercise or activity that you enjoy, and set realistic goals.  Start slowly, and gradually increase the  intensity of your exercise over time. What do I need to know about managing my diabetes?   Check your blood glucose before and after exercising. ? If your blood glucose is 240 mg/dL (13.3 mmol/L) or higher before you exercise, check your urine for ketones. If you have ketones in your urine, do not exercise until your blood glucose returns to normal. ? If your blood glucose is 100 mg/dL (5.6 mmol/L) or lower, eat a snack containing 15-20 grams of carbohydrate. Check your blood glucose 15 minutes after the snack to make sure that your level is above 100 mg/dL (5.6 mmol/L) before you start your exercise.  Know the symptoms of low blood glucose (hypoglycemia) and how to treat it. Your risk for hypoglycemia increases during and after exercise. Common symptoms of hypoglycemia can include: ? Hunger. ? Anxiety. ? Sweating and feeling clammy. ? Confusion. ? Dizziness or feeling light-headed. ? Increased heart rate or palpitations. ? Blurry vision. ? Tingling or numbness around the mouth, lips, or tongue. ? Tremors or shakes. ? Irritability.  Keep a rapid-acting carbohydrate snack available before, during, and after exercise to help prevent or treat hypoglycemia.  Avoid injecting insulin into areas of the body that are going to be exercised. For example, avoid injecting insulin into: ? The arms, when playing tennis. ? The legs, when jogging.  Keep records of your exercise habits. Doing this can help you and your health care provider adjust your diabetes management plan as needed. Write down: ? Food that you eat before and after you exercise. ? Blood glucose levels before and after you exercise. ? The type and amount of  exercise you have done. ? When your insulin is expected to peak, if you use insulin. Avoid exercising at times when your insulin is peaking.  When you start a new exercise or activity, work with your health care provider to make sure the activity is safe for you, and to adjust  your insulin, medicines, or food intake as needed.  Drink plenty of water while you exercise to prevent dehydration or heat stroke. Drink enough fluid to keep your urine clear or pale yellow. Summary  Exercising regularly is important for your overall health, especially when you have diabetes (diabetes mellitus).  Exercising has many health benefits, such as increasing muscle strength and bone density and reducing body fat and stress.  Your health care provider or certified diabetes educator can help you make a plan for the type and frequency of exercise (activity plan) that works for you.  When you start a new exercise or activity, work with your health care provider to make sure the activity is safe for you, and to adjust your insulin, medicines, or food intake as needed. This information is not intended to replace advice given to you by your health care provider. Make sure you discuss any questions you have with your health care provider. Document Revised: 07/15/2016 Document Reviewed: 06/02/2015 Elsevier Patient Education  [employer].

## 2019-09-05 LAB — URIC ACID: Uric Acid: 5.2 mg/dL (ref 3.0–7.2)

## 2019-09-18 ENCOUNTER — Other Ambulatory Visit: Payer: Self-pay

## 2019-09-18 ENCOUNTER — Other Ambulatory Visit: Payer: Self-pay | Admitting: Internal Medicine

## 2019-09-18 ENCOUNTER — Telehealth: Payer: Medicare Other

## 2019-09-18 ENCOUNTER — Ambulatory Visit: Payer: Self-pay

## 2019-09-18 DIAGNOSIS — N184 Chronic kidney disease, stage 4 (severe): Secondary | ICD-10-CM

## 2019-09-18 DIAGNOSIS — I129 Hypertensive chronic kidney disease with stage 1 through stage 4 chronic kidney disease, or unspecified chronic kidney disease: Secondary | ICD-10-CM

## 2019-09-18 DIAGNOSIS — I10 Essential (primary) hypertension: Secondary | ICD-10-CM

## 2019-09-18 DIAGNOSIS — E1122 Type 2 diabetes mellitus with diabetic chronic kidney disease: Secondary | ICD-10-CM

## 2019-09-18 DIAGNOSIS — E7800 Pure hypercholesterolemia, unspecified: Secondary | ICD-10-CM

## 2019-09-20 NOTE — Chronic Care Management (AMB) (Signed)
Chronic Care Management   Follow Up Note   09/18/2019 Name: Holly Peters MRN: [id number] DOB: 01/06/1950  Referred by: Sanders, Robyn, MD Reason for referral : Chronic Care Management (FU RN CM Call )   Holly Peters is a 69 y.o. year old female who is a primary care patient of Sanders, Robyn, MD. The CCM team was consulted for assistance with chronic disease management and care coordination needs.    Review of patient status, including review of consultants reports, relevant laboratory and other test results, and collaboration with appropriate care team members and the patient's provider was performed as part of comprehensive patient evaluation and provision of chronic care management services.    SDOH (Social Determinants of Health) assessments performed: Yes - no acute challenges at this time See Care Plan activities for detailed interventions related to SDOH)   Placed outbound call to patient for a CCM RN CM care plan follow up call.     Outpatient Encounter Medications as of 09/18/2019  Medication Sig  . allopurinol (ZYLOPRIM) 100 MG tablet Take 1 tablet (100 mg total) by mouth daily.  . Cholecalciferol (VITAMIN D) 125 MCG (5000 UT) CAPS Take by mouth.  . diltiazem (CARDIZEM CD) 240 MG 24 hr capsule TAKE 1 CAPSULE DAILY  . Dulaglutide (TRULICITY) 0.75 MG/0.5ML SOPN Inject 0.75 mg into the skin once a week.  . FREESTYLE LITE test strip USE TO CHECK BLOOD SUGAR ONCE DAILY  . ibuprofen (ADVIL,MOTRIN) 400 MG tablet Take 400 mg by mouth every 6 (six) hours as needed.  . Psyllium (METAMUCIL FIBER PO) Take by mouth.  . simvastatin (ZOCOR) 20 MG tablet TAKE 1 TABLET DAILY  . telmisartan-hydrochlorothiazide (MICARDIS HCT) 80-25 MG tablet Take 1 tablet by mouth daily.   No facility-administered encounter medications on file as of 09/18/2019.     Objective:  Lab Results  Component Value Date   HGBA1C 7.3 (H) 05/31/2019   HGBA1C 7.7 (H) 02/12/2019   HGBA1C 6.4 (H)  10/11/2018   Lab Results  Component Value Date   MICROALBUR 30 11/21/2018   LDLCALC 83 05/31/2019   CREATININE 2.24 (H) 05/31/2019   BP Readings from Last 3 Encounters:  09/04/19 122/68  08/21/19 (!) 154/81  05/31/19 136/78    Goals Addressed      Patient Stated   .  "to get my BP down a little" (pt-stated)   On track     CARE PLAN ENTRY (see longtitudinal plan of care for additional care plan information)  Current Barriers:  . Knowledge Deficits related to disease process and Self Health management of HTN . Chronic Disease Management support and education needs related to Essential Hypertension, CKD IV, DM II, HLD  Nurse Case Manager Clinical Goal(s):  . 09/18/19 New Over the next 90 days, patient will work with the CCM team and PCP to address needs related to disease education and support for improved Self management of HTN  CCM RN CM Interventions:  09/18/19 call completed with patient  . Evaluation of current treatment plan related to HTN and patient's adherence to plan as established by provider . Re-educated patient re: target BP <130/80: Re-educated on dietary and exercise recommendations; Determined patient is self monitoring her BP and reports BP is within range  . Reviewed medications with patient and discussed indication, dosage and frequency of prescribed antihypertensives; patient reports adherence w/o noted SE . Advised patient, providing education and rationale, to monitor blood pressure daily and record, calling the CCM team and or PCP   for findings outside established parameters . Discussed plans with patient for ongoing care management follow up and provided patient with direct contact information for care management team  Patient Self Care Activities:  . Self administers medications as prescribed . Attends all scheduled provider appointments . Calls pharmacy for medication refills . Performs ADL's independently . Performs IADL's independently . Calls  provider office for new concerns or questions  Please see past updates related to this goal by clicking on the "Past Updates" button in the selected goal      .  "to maintain or improve my kideny function" (pt-stated)   On track     [place] (see longtitudinal plan of care for additional care plan information)  Current Barriers:  [name] Knowledge Deficits related to disease process and Self Health management of DM, CKD . Chronic Disease Management support and education needs related to CKD IV, DM II, HTN, HLD  Nurse Case Manager Clinical Goal(s):  [name] Over the next 30 days, patient will maintain or improve current GFR of 27 and or will maintain or improve current BUN 33, Creatinine of 2.13 (obtained on 02/12/19) Goal Not Met  . 09/18/19 New Over the next 180 days, patient will work with the CCM team and or PCP to address needs related to disease education and support to improve Self management of DM and CKD  CCM RN CM Interventions:  09/18/19 Inbound call completed with patient  . Evaluation of current treatment plan related to DM II, CKD IV and patient's adherence to plan as established by provider . Reviewed and discussed most recent A1c of 7.3% this has decreased from 7.7%; Re-educated on target A1c <7.0; Re-educated on dietary and exercise recommendations; Educated on daily glycemic control FBS 80-130, < 180 after meals  . Determined patient is Self monitoring her CBG's, average readings are within range  . Advised patient, providing education and rationale, to check cbg 1-2 daily before meals and record, calling the CCM team and or PCP for findings outside established parameters . Mailed printed educational materials related to Diabetes Management using Meal Planning; Diabetes Care Schedule; Preventing Complications from Diabetes; Grocery Shopping with Diabetes . Discussed plans with patient for ongoing care management follow up and provided patient with direct contact information for  care management team  Patient Self Care Activities:  . Self administers medications as prescribed . Attends all scheduled provider appointments . Calls pharmacy for medication refills . Performs ADL's independently . Performs IADL's independently . Calls provider office for new concerns or questions  Please see past updates related to this goal by clicking on the "Past Updates" button in the selected goal        Plan:   Telephone follow up appointment with care management team member scheduled for: 10/12/19  Barb Merino, RN, BSN, CCM Care Management Coordinator [facility]  Direct Phone: [phone]

## 2019-09-20 NOTE — Patient Instructions (Addendum)
Visit Information  Goals Addressed      Patient Stated     "to get my BP down a Holly Peters" (pt-stated)   On track     [place] (see longtitudinal plan of care for additional care plan information)  Current Barriers:   Knowledge Deficits related to disease process and Self Health management of HTN  Chronic Disease Management support and education needs related to Essential Hypertension, CKD IV, DM II, HLD  Nurse Case Manager Clinical Goal(s):   09/18/19 New Over the next 90 days, patient will work with the CCM team and PCP to address needs related to disease education and support for improved Self management of HTN  CCM RN CM Interventions:  09/18/19 call completed with patient   Evaluation of current treatment plan related to HTN and patient's adherence to plan as established by provider  Re-educated patient re: target BP <130/80: Re-educated on dietary and exercise recommendations; Determined patient is self monitoring her BP and reports BP is within range   Reviewed medications with patient and discussed indication, dosage and frequency of prescribed antihypertensives; patient reports adherence w/o noted SE  Advised patient, providing education and rationale, to monitor blood pressure daily and record, calling the CCM team and or PCP for findings outside established parameters  Discussed plans with patient for ongoing care management follow up and provided patient with direct contact information for care management team  Patient Self Care Activities:   Self administers medications as prescribed  Attends all scheduled provider appointments  Calls pharmacy for medication refills  Performs ADL's independently  Performs IADL's independently  Calls provider office for new concerns or questions  Please see past updates related to this goal by clicking on the "Past Updates" button in the selected goal        "to maintain or improve my kideny function" (pt-stated)    On track     [place] (see longtitudinal plan of care for additional care plan information)  Current Barriers:   Knowledge Deficits related to disease process and Self Health management of DM, CKD  Chronic Disease Management support and education needs related to CKD IV, DM II, HTN, HLD  Nurse Case Manager Clinical Goal(s):   Over the next 30 days, patient will maintain or improve current GFR of 27 and or will maintain or improve current BUN 33, Creatinine of 2.13 (obtained on 02/12/19) Goal Not Met   09/18/19 New Over the next 180 days, patient will work with the CCM team and or PCP to address needs related to disease education and support to improve Self management of DM and CKD  CCM RN CM Interventions:  09/18/19 Inbound call completed with patient   Evaluation of current treatment plan related to DM II, CKD IV and patient's adherence to plan as established by provider  Reviewed and discussed most recent A1c of 7.3% this has decreased from 7.7%; Re-educated on target A1c <7.0; Re-educated on dietary and exercise recommendations; Educated on daily glycemic control FBS 80-130, < 180 after meals   Determined patient is Self monitoring her CBG's, average readings are within range   Advised patient, providing education and rationale, to check cbg 1-2 daily before meals and record, calling the CCM team and or PCP for findings outside established parameters  Mailed printed educational materials related to Diabetes Management using Meal Planning; Diabetes Care Schedule; Preventing Complications from Diabetes; Grocery Shopping with Diabetes  Discussed plans with patient for ongoing care management follow up and provided  patient with direct contact information for care management team  Patient Self Care Activities:   Self administers medications as prescribed  Attends all scheduled provider appointments  Calls pharmacy for medication refills  Performs ADL's  independently  Performs IADL's independently  Calls provider office for new concerns or questions  Please see past updates related to this goal by clicking on the "Past Updates" button in the selected goal        Patient verbalizes understanding of instructions provided today.   Telephone follow up appointment with care management team member scheduled for: 10/12/19  Holly Merino, RN, BSN, CCM Care Management Coordinator [place] [facility]  Direct Phone: [phone]

## 2019-09-25 ENCOUNTER — Ambulatory Visit: Payer: Medicare Other

## 2019-09-27 ENCOUNTER — Ambulatory Visit (INDEPENDENT_AMBULATORY_CARE_PROVIDER_SITE_OTHER): Payer: Medicare Other

## 2019-09-27 ENCOUNTER — Other Ambulatory Visit: Payer: Self-pay

## 2019-09-27 VITALS — BP 126/64 | HR 60 | Temp 98.2°F | Ht 61.6 in | Wt 140.6 lb

## 2019-09-27 DIAGNOSIS — E1122 Type 2 diabetes mellitus with diabetic chronic kidney disease: Secondary | ICD-10-CM

## 2019-09-27 DIAGNOSIS — Z23 Encounter for immunization: Secondary | ICD-10-CM

## 2019-09-27 DIAGNOSIS — N184 Chronic kidney disease, stage 4 (severe): Secondary | ICD-10-CM

## 2019-09-27 NOTE — Progress Notes (Signed)
Patient is here for influenza vaccine 

## 2019-09-28 LAB — BMP8+EGFR
BUN/Creatinine Ratio: 16 (ref 12–28)
BUN: 29 mg/dL — ABNORMAL HIGH (ref 8–27)
CO2: 21 mmol/L (ref 20–29)
Calcium: 10.1 mg/dL (ref 8.7–10.3)
Chloride: 106 mmol/L (ref 96–106)
Creatinine, Ser: 1.81 mg/dL — ABNORMAL HIGH (ref 0.57–1.00)
GFR calc Af Amer: 32 mL/min/{1.73_m2} — ABNORMAL LOW (ref >59)
GFR calc non Af Amer: 28 mL/min/{1.73_m2} — ABNORMAL LOW (ref >59)
Glucose: 118 mg/dL — ABNORMAL HIGH (ref 65–99)
Potassium: 4 mmol/L (ref 3.5–5.2)
Sodium: 142 mmol/L (ref 134–144)

## 2019-09-28 LAB — HEMOGLOBIN A1C
Est. average glucose Bld gHb Est-mCnc: 148 mg/dL
Hgb A1c MFr Bld: 6.8 % — ABNORMAL HIGH (ref 4.8–5.6)

## 2019-10-12 ENCOUNTER — Telehealth: Payer: Medicare Other

## 2019-10-12 ENCOUNTER — Telehealth: Payer: Self-pay

## 2019-10-12 DIAGNOSIS — Z23 Encounter for immunization: Secondary | ICD-10-CM | POA: Diagnosis not present

## 2019-10-12 NOTE — Telephone Encounter (Cosign Needed)
  Chronic Care Management   Outreach Note  10/12/2019 Name: Holly Peters MRN: [id number] DOB: 1950-05-14  Referred by: Glendale Chard, MD Reason for referral : Chronic Care Management (CCM RNCM FU Call )   An unsuccessful telephone outreach was attempted today. The patient was referred to the case management team for assistance with care management and care coordination.   Follow Up Plan: A HIPAA compliant phone message was left for the patient providing contact information and requesting a return call.  Telephone follow up appointment with care management team member scheduled for: 11/12/19  Barb Merino, RN, BSN, CCM Care Management Coordinator [place] [facility]  Direct Phone: [phone]

## 2019-10-15 ENCOUNTER — Telehealth: Payer: Medicare Other

## 2019-10-15 ENCOUNTER — Ambulatory Visit: Payer: Self-pay

## 2019-10-15 ENCOUNTER — Other Ambulatory Visit: Payer: Self-pay

## 2019-10-15 DIAGNOSIS — E1122 Type 2 diabetes mellitus with diabetic chronic kidney disease: Secondary | ICD-10-CM

## 2019-10-15 DIAGNOSIS — I10 Essential (primary) hypertension: Secondary | ICD-10-CM

## 2019-10-15 DIAGNOSIS — N184 Chronic kidney disease, stage 4 (severe): Secondary | ICD-10-CM

## 2019-10-15 DIAGNOSIS — I129 Hypertensive chronic kidney disease with stage 1 through stage 4 chronic kidney disease, or unspecified chronic kidney disease: Secondary | ICD-10-CM

## 2019-10-15 DIAGNOSIS — E7800 Pure hypercholesterolemia, unspecified: Secondary | ICD-10-CM

## 2019-10-16 ENCOUNTER — Ambulatory Visit: Payer: Medicare Other | Admitting: Internal Medicine

## 2019-10-16 NOTE — Patient Instructions (Signed)
Visit Information  Goals Addressed      Patient Stated   .  "to maintain or improve my kidney and diabetes" (pt-stated)   On track     [place] (see longtitudinal plan of care for additional care plan information)  Current Barriers:  [name] Knowledge Deficits related to disease process and Self Health management of DM, CKD . Chronic Disease Management support and education needs related to CKD IV, DM II, HTN, HLD  Nurse Case Manager Clinical Goal(s):  [name] Over the next 30 days, patient will maintain or improve current GFR of 27 and or will maintain or improve current BUN 33, Creatinine of 2.13 (obtained on 02/12/19) Goal Not Met  . 09/18/19 New Over the next 180 days, patient will work with the CCM team and or PCP to address needs related to disease education and support to improve Self management of DMII and CKD  CCM RN CM Interventions:  10/15/19 Inbound call completed with patient  . Evaluation of current treatment plan related to DM II, CKD IV and patient's adherence to plan as established by provider . Reviewed and discussed most recent A1c of 6.8 this has decreased from 7.3%; Re-educated on target A1c <7.0; Re-educated on dietary and exercise recommendations; Educated on daily glycemic control FBS 80-130, < 180 after meals; Educated patient on 15'15' rule  . Positive reinforcement given to patient for making efforts to lower her A1c and improve her kidney function as a result; Educated on slightly improved GFR Reinforced importance of drinking plenty of water  . Advised patient, providing education and rationale, to check cbg 1-2 daily before meals and record, calling the CCM team and or PCP for findings outside established parameters . Mailed printed educational materials related to Diabetes and Kidney disease; Salt Substitutes for Kidney patients . Discussed plans with patient for ongoing care management follow up and provided patient with direct contact information for care  management team  Patient Self Care Activities:  . Self administers medications as prescribed . Attends all scheduled provider appointments . Calls pharmacy for medication refills . Performs ADL's independently . Performs IADL's independently . Calls provider office for new concerns or questions  Please see past updates related to this goal by clicking on the "Past Updates" button in the selected goal        Patient verbalizes understanding of instructions provided today.   Telephone follow up appointment with care management team member scheduled for: 12/17/19  Barb Merino, RN, BSN, CCM Care Management Coordinator [place] [facility]  Direct Phone: [phone]

## 2019-10-16 NOTE — Chronic Care Management (AMB) (Signed)
Chronic Care Management   Follow Up Note   10/15/2019 Name: Holly Peters MRN: [id number] DOB: 1950/08/24  Referred by: Glendale Chard, MD Reason for referral : Chronic Care Management (CCM RNCM FU Call )   Holly Peters is a 69 y.o. year old female who is a primary care patient of Glendale Chard, MD. The CCM team was consulted for assistance with chronic disease management and care coordination needs.    Review of patient status, including review of consultants reports, relevant laboratory and other test results, and collaboration with appropriate care team members and the patient's provider was performed as part of comprehensive patient evaluation and provision of chronic care management services.    SDOH (Social Determinants of Health) assessments performed: Yes - no acute challenges identified at this time See Care Plan activities for detailed interventions related to [place])   Placed outbound CCM RN CM follow up call to patient for a care plan update.     Outpatient Encounter Medications as of 10/15/2019  Medication Sig  . allopurinol (ZYLOPRIM) 100 MG tablet Take 1 tablet (100 mg total) by mouth daily.  . Cholecalciferol (VITAMIN D) 125 MCG (5000 UT) CAPS Take by mouth.  . diltiazem (CARDIZEM CD) 240 MG 24 hr capsule TAKE 1 CAPSULE DAILY  . Dulaglutide (TRULICITY) 1.69 IH/0.3UU SOPN Inject 0.75 mg into the skin once a week.  [name] FREESTYLE LITE test strip USE TO CHECK BLOOD SUGAR ONCE DAILY  . ibuprofen (ADVIL,MOTRIN) 400 MG tablet Take 400 mg by mouth every 6 (six) hours as needed.  . Psyllium (METAMUCIL FIBER PO) Take by mouth.  . simvastatin (ZOCOR) 20 MG tablet TAKE 1 TABLET DAILY  . telmisartan-hydrochlorothiazide (MICARDIS HCT) 80-25 MG tablet Take 1 tablet by mouth daily.   No facility-administered encounter medications on file as of 10/15/2019.     Objective:  Lab Results  Component Value Date   HGBA1C 6.8 (H) 09/27/2019   HGBA1C 7.3 (H) 05/31/2019    HGBA1C 7.7 (H) 02/12/2019   Lab Results  Component Value Date   MICROALBUR 30 11/21/2018   LDLCALC 83 05/31/2019   CREATININE 1.81 (H) 09/27/2019   BP Readings from Last 3 Encounters:  09/27/19 126/64  09/04/19 122/68  08/21/19 (!) 154/81    Goals Addressed      Patient Stated   .  "to maintain or improve my kidney and diabetes" (pt-stated)   On track     [place] (see longtitudinal plan of care for additional care plan information)  Current Barriers:  [name] Knowledge Deficits related to disease process and Self Health management of DM, CKD . Chronic Disease Management support and education needs related to CKD IV, DM II, HTN, HLD  Nurse Case Manager Clinical Goal(s):  [name] Over the next 30 days, patient will maintain or improve current GFR of 27 and or will maintain or improve current BUN 33, Creatinine of 2.13 (obtained on 02/12/19) Goal Not Met  . 09/18/19 New Over the next 180 days, patient will work with the CCM team and or PCP to address needs related to disease education and support to improve Self management of DMII and CKD  CCM RN CM Interventions:  10/15/19 Inbound call completed with patient  . Evaluation of current treatment plan related to DM II, CKD IV and patient's adherence to plan as established by provider . Reviewed and discussed most recent A1c of 6.8 this has decreased from 7.3%; Re-educated on target A1c <7.0; Re-educated on dietary and exercise recommendations;  Educated on daily glycemic control FBS 80-130, < 180 after meals; Educated patient on 15'15' rule  . Positive reinforcement given to patient for making efforts to lower her A1c and improve her kidney function as a result; Educated on slightly improved GFR Reinforced importance of drinking plenty of water  . Advised patient, providing education and rationale, to check cbg 1-2 daily before meals and record, calling the CCM team and or PCP for findings outside established parameters . Mailed printed  educational materials related to Diabetes and Kidney disease; Salt Substitutes for Kidney patients . Discussed plans with patient for ongoing care management follow up and provided patient with direct contact information for care management team  Patient Self Care Activities:  . Self administers medications as prescribed . Attends all scheduled provider appointments . Calls pharmacy for medication refills . Performs ADL's independently . Performs IADL's independently . Calls provider office for new concerns or questions  Please see past updates related to this goal by clicking on the "Past Updates" button in the selected goal        Plan:   Telephone follow up appointment with care management team member scheduled for: 12/17/19  Barb Merino, RN, BSN, CCM Care Management Coordinator [place] [facility]  Direct Phone: [phone]

## 2019-11-07 ENCOUNTER — Ambulatory Visit: Payer: Medicare Other | Admitting: Internal Medicine

## 2019-11-12 ENCOUNTER — Telehealth: Payer: Medicare Other

## 2019-11-12 DIAGNOSIS — N183 Chronic kidney disease, stage 3 unspecified: Secondary | ICD-10-CM | POA: Diagnosis not present

## 2019-11-12 DIAGNOSIS — E1122 Type 2 diabetes mellitus with diabetic chronic kidney disease: Secondary | ICD-10-CM | POA: Diagnosis not present

## 2019-11-12 DIAGNOSIS — M109 Gout, unspecified: Secondary | ICD-10-CM | POA: Diagnosis not present

## 2019-11-12 DIAGNOSIS — I129 Hypertensive chronic kidney disease with stage 1 through stage 4 chronic kidney disease, or unspecified chronic kidney disease: Secondary | ICD-10-CM | POA: Diagnosis not present

## 2019-11-12 DIAGNOSIS — E7800 Pure hypercholesterolemia, unspecified: Secondary | ICD-10-CM | POA: Diagnosis not present

## 2019-12-04 ENCOUNTER — Encounter: Payer: Medicare Other | Admitting: Internal Medicine

## 2019-12-12 ENCOUNTER — Other Ambulatory Visit: Payer: Self-pay

## 2019-12-12 ENCOUNTER — Ambulatory Visit (INDEPENDENT_AMBULATORY_CARE_PROVIDER_SITE_OTHER): Payer: Medicare Other | Admitting: Internal Medicine

## 2019-12-12 ENCOUNTER — Encounter: Payer: Self-pay | Admitting: Internal Medicine

## 2019-12-12 ENCOUNTER — Ambulatory Visit (INDEPENDENT_AMBULATORY_CARE_PROVIDER_SITE_OTHER): Payer: Medicare Other

## 2019-12-12 VITALS — BP 124/74 | HR 83 | Temp 98.2°F | Ht 62.0 in | Wt 136.2 lb

## 2019-12-12 VITALS — BP 124/72 | HR 83 | Temp 98.2°F | Ht 62.0 in | Wt 136.2 lb

## 2019-12-12 DIAGNOSIS — N184 Chronic kidney disease, stage 4 (severe): Secondary | ICD-10-CM | POA: Diagnosis not present

## 2019-12-12 DIAGNOSIS — Z6824 Body mass index (BMI) 24.0-24.9, adult: Secondary | ICD-10-CM

## 2019-12-12 DIAGNOSIS — Z0000 Encounter for general adult medical examination without abnormal findings: Secondary | ICD-10-CM

## 2019-12-12 DIAGNOSIS — I129 Hypertensive chronic kidney disease with stage 1 through stage 4 chronic kidney disease, or unspecified chronic kidney disease: Secondary | ICD-10-CM | POA: Diagnosis not present

## 2019-12-12 DIAGNOSIS — E1122 Type 2 diabetes mellitus with diabetic chronic kidney disease: Secondary | ICD-10-CM | POA: Diagnosis not present

## 2019-12-12 DIAGNOSIS — E7800 Pure hypercholesterolemia, unspecified: Secondary | ICD-10-CM

## 2019-12-12 NOTE — Patient Instructions (Signed)
Holly Peters , Thank you for taking time to come for your Medicare Wellness Visit. I appreciate your ongoing commitment to your health goals. Please review the following plan we discussed and let me know if I can assist you in the future.   Screening recommendations/referrals: Colonoscopy: completed 03/23/2017, due 03/24/2027 Mammogram: completed 01/22/2019 Bone Density: completed 02/14/2019 Recommended yearly ophthalmology/optometry visit for glaucoma screening and checkup Recommended yearly dental visit for hygiene and checkup  Vaccinations: Influenza vaccine: completed 09/27/2019, due 08/04/2020 Pneumococcal vaccine: completed 10/04/2017 Tdap vaccine: completed 11/12/2010, due 11/11/2020 Shingles vaccine: complete   Covid-19: 02/25/2019, 02/04/2019  Advanced directives: Please bring a copy of your POA (Power of Attorney) and/or Living Will to your next appointment.   Conditions/risks identified: none  Next appointment: Follow up in one year for your annual wellness visit    Preventive Care 65 Years and Older, Female Preventive care refers to lifestyle choices and visits with your health care provider that can promote health and wellness. What does preventive care include?  A yearly physical exam. This is also called an annual well check.  Dental exams once or twice a year.  Routine eye exams. Ask your health care provider how often you should have your eyes checked.  Personal lifestyle choices, including:  Daily care of your teeth and gums.  Regular physical activity.  Eating a healthy diet.  Avoiding tobacco and drug use.  Limiting alcohol use.  Practicing safe sex.  Taking low-dose aspirin every day.  Taking vitamin and mineral supplements as recommended by your health care provider. What happens during an annual well check? The services and screenings done by your health care provider during your annual well check will depend on your age, overall health, lifestyle risk  factors, and family history of disease. Counseling  Your health care provider may ask you questions about your:  Alcohol use.  Tobacco use.  Drug use.  Emotional well-being.  Home and relationship well-being.  Sexual activity.  Eating habits.  History of falls.  Memory and ability to understand (cognition).  Work and work Statistician.  Reproductive health. Screening  You may have the following tests or measurements:  Height, weight, and BMI.  Blood pressure.  Lipid and cholesterol levels. These may be checked every 5 years, or more frequently if you are over 4 years old.  Skin check.  Lung cancer screening. You may have this screening every year starting at age 79 if you have a 30-pack-year history of smoking and currently smoke or have quit within the past 15 years.  Fecal occult blood test (FOBT) of the stool. You may have this test every year starting at age 95.  Flexible sigmoidoscopy or colonoscopy. You may have a sigmoidoscopy every 5 years or a colonoscopy every 10 years starting at age 30.  Hepatitis C blood test.  Hepatitis B blood test.  Sexually transmitted disease (STD) testing.  Diabetes screening. This is done by checking your blood sugar (glucose) after you have not eaten for a while (fasting). You may have this done every 1-3 years.  Bone density scan. This is done to screen for osteoporosis. You may have this done starting at age 16.  Mammogram. This may be done every 1-2 years. Talk to your health care provider about how often you should have regular mammograms. Talk with your health care provider about your test results, treatment options, and if necessary, the need for more tests. Vaccines  Your health care provider may recommend certain vaccines, such as:  Influenza vaccine. This is recommended every year.  Tetanus, diphtheria, and acellular pertussis (Tdap, Td) vaccine. You may need a Td booster every 10 years.  Zoster vaccine. You  may need this after age 79.  Pneumococcal 13-valent conjugate (PCV13) vaccine. One dose is recommended after age 25.  Pneumococcal polysaccharide (PPSV23) vaccine. One dose is recommended after age 69. Talk to your health care provider about which screenings and vaccines you need and how often you need them. This information is not intended to replace advice given to you by your health care provider. Make sure you discuss any questions you have with your health care provider. Document Released: 01/17/2015 Document Revised: 09/10/2015 Document Reviewed: 10/22/2014 Elsevier Interactive Patient Education  2017 [place] Prevention in the Home Falls can cause injuries. They can happen to people of all ages. There are many things you can do to make your home safe and to help prevent falls. What can I do on the outside of my home?  Regularly fix the edges of walkways and driveways and fix any cracks.  Remove anything that might make you trip as you walk through a door, such as a raised step or threshold.  Trim any bushes or trees on the path to your home.  Use bright outdoor lighting.  Clear any walking paths of anything that might make someone trip, such as rocks or tools.  Regularly check to see if handrails are loose or broken. Make sure that both sides of any steps have handrails.  Any raised decks and porches should have guardrails on the edges.  Have any leaves, snow, or ice cleared regularly.  Use sand or salt on walking paths during winter.  Clean up any spills in your garage right away. This includes oil or grease spills. What can I do in the bathroom?  Use night lights.  Install grab bars by the toilet and in the tub and shower. Do not use towel bars as grab bars.  Use non-skid mats or decals in the tub or shower.  If you need to sit down in the shower, use a plastic, non-slip stool.  Keep the floor dry. Clean up any water that spills on the floor as soon as  it happens.  Remove soap buildup in the tub or shower regularly.  Attach bath mats securely with double-sided non-slip rug tape.  Do not have throw rugs and other things on the floor that can make you trip. What can I do in the bedroom?  Use night lights.  Make sure that you have a light by your bed that is easy to reach.  Do not use any sheets or blankets that are too big for your bed. They should not hang down onto the floor.  Have a firm chair that has side arms. You can use this for support while you get dressed.  Do not have throw rugs and other things on the floor that can make you trip. What can I do in the kitchen?  Clean up any spills right away.  Avoid walking on wet floors.  Keep items that you use a lot in easy-to-reach places.  If you need to reach something above you, use a strong step stool that has a grab bar.  Keep electrical cords out of the way.  Do not use floor polish or wax that makes floors slippery. If you must use wax, use non-skid floor wax.  Do not have throw rugs and other things on the floor that  can make you trip. What can I do with my stairs?  Do not leave any items on the stairs.  Make sure that there are handrails on both sides of the stairs and use them. Fix handrails that are broken or loose. Make sure that handrails are as long as the stairways.  Check any carpeting to make sure that it is firmly attached to the stairs. Fix any carpet that is loose or worn.  Avoid having throw rugs at the top or bottom of the stairs. If you do have throw rugs, attach them to the floor with carpet tape.  Make sure that you have a light switch at the top of the stairs and the bottom of the stairs. If you do not have them, ask someone to add them for you. What else can I do to help prevent falls?  Wear shoes that:  Do not have high heels.  Have rubber bottoms.  Are comfortable and fit you well.  Are closed at the toe. Do not wear sandals.  If  you use a stepladder:  Make sure that it is fully opened. Do not climb a closed stepladder.  Make sure that both sides of the stepladder are locked into place.  Ask someone to hold it for you, if possible.  Clearly mark and make sure that you can see:  Any grab bars or handrails.  First and last steps.  Where the edge of each step is.  Use tools that help you move around (mobility aids) if they are needed. These include:  Canes.  Walkers.  Scooters.  Crutches.  Turn on the lights when you go into a dark area. Replace any light bulbs as soon as they burn out.  Set up your furniture so you have a clear path. Avoid moving your furniture around.  If any of your floors are uneven, fix them.  If there are any pets around you, be aware of where they are.  Review your medicines with your doctor. Some medicines can make you feel dizzy. This can increase your chance of falling. Ask your doctor what other things that you can do to help prevent falls. This information is not intended to replace advice given to you by your health care provider. Make sure you discuss any questions you have with your health care provider. Document Released: 10/17/2008 Document Revised: 05/29/2015 Document Reviewed: 01/25/2014 Elsevier Interactive Patient Education  2017 [employer].

## 2019-12-12 NOTE — Progress Notes (Signed)
I,Katawbba Wiggins,acting as a Education administrator for Maximino Greenland, MD.,have documented all relevant documentation on the behalf of Maximino Greenland, MD,as directed by  Maximino Greenland, MD while in the presence of Maximino Greenland, MD.  This visit occurred during the SARS-CoV-2 public health emergency.  Safety protocols were in place, including screening questions prior to the visit, additional usage of staff PPE, and extensive cleaning of exam room while observing appropriate contact time as indicated for disinfecting solutions.  Subjective:     Patient ID: Holly Peters , female    DOB: [birth year] , 69 y.o.   MRN: [id number]   Chief Complaint  Patient presents with  . Diabetes  . Hypertension    HPI  She reports for diabetes check today. She is compliant with medication. She does not have any concerns at this time. She is also scheduled for AWV with [facility] Advisor today.   Diabetes She presents for her follow-up diabetic visit. She has type 2 diabetes mellitus. Her disease course has been stable. There are no hypoglycemic associated symptoms. Pertinent negatives for diabetes include no blurred vision and no chest pain. There are no hypoglycemic complications. Risk factors for coronary artery disease include diabetes mellitus, dyslipidemia, hypertension, sedentary lifestyle and post-menopausal. Her weight is stable. She is following a diabetic diet. Her breakfast blood glucose is taken between 8-9 am. Her breakfast blood glucose range is generally 90-110 mg/dl. Eye exam is current.  Hypertension This is a chronic problem. The current episode started more than 1 year ago. The problem has been gradually improving since onset. The problem is controlled. Pertinent negatives include no blurred vision or chest pain.     Past Medical History:  Diagnosis Date  . Diabetes mellitus without complication ([place])   . Hyperlipemia   . Hypertension      Family History  Problem Relation Age of Onset  .  Diabetes Mother   . Healthy Father      Current Outpatient Medications:  .  allopurinol (ZYLOPRIM) 100 MG tablet, Take 1 tablet (100 mg total) by mouth daily., Disp: 90 tablet, Rfl: 3 .  Cholecalciferol (VITAMIN D) 125 MCG (5000 UT) CAPS, Take by mouth., Disp: , Rfl:  .  diltiazem (CARDIZEM CD) 240 MG 24 hr capsule, TAKE 1 CAPSULE DAILY, Disp: 90 capsule, Rfl: 3 .  Dulaglutide (TRULICITY) 2.62 MB/5.5HR SOPN, Inject 0.75 mg into the skin once a week., Disp: 6 mL, Rfl: 4 .  FREESTYLE LITE test strip, USE TO CHECK BLOOD SUGAR ONCE DAILY, Disp: 100 each, Rfl: 3 .  ibuprofen (ADVIL,MOTRIN) 400 MG tablet, Take 400 mg by mouth every 6 (six) hours as needed., Disp: , Rfl:  .  Psyllium (METAMUCIL FIBER PO), Take by mouth., Disp: , Rfl:  .  simvastatin (ZOCOR) 20 MG tablet, TAKE 1 TABLET DAILY, Disp: 90 tablet, Rfl: 3 .  telmisartan-hydrochlorothiazide (MICARDIS HCT) 80-25 MG tablet, Take 1 tablet by mouth daily., Disp: 90 tablet, Rfl: 2   No Known Allergies   Review of Systems  Constitutional: Negative.   Eyes: Negative for blurred vision.  Respiratory: Negative.   Cardiovascular: Negative.  Negative for chest pain.  Gastrointestinal: Negative.   Psychiatric/Behavioral: Negative.   All other systems reviewed and are negative.    Today's Vitals   12/12/19 1618  BP: 124/74  Pulse: 83  Temp: 98.2 F (36.8 C)  TempSrc: Oral  Weight: 136 lb 3.2 oz (61.8 kg)  Height: _0  (1.575 m)   Body mass index is  24.91 kg/m.   Objective:  Physical Exam Vitals and nursing note reviewed.  Constitutional:      Appearance: Normal appearance.  HENT:     Head: Normocephalic and atraumatic.  Cardiovascular:     Rate and Rhythm: Normal rate and regular rhythm.     Heart sounds: Normal heart sounds.  Pulmonary:     Breath sounds: Normal breath sounds.  Skin:    General: Skin is warm.  Neurological:     General: No focal deficit present.     Mental Status: She is alert and oriented to person,  place, and time.         Assessment And Plan:     1. Type 2 diabetes mellitus with stage 4 chronic kidney disease, without long-term current use of insulin (HCC) Comments: Chronic, I will check labs as listed below. I will adjust meds as needed.  - Hemoglobin A1c - CMP14+EGFR  2. Parenchymal renal hypertension, stage 1 through stage 4 or unspecified chronic kidney disease Comments: Chronic, well controlled. She will continue with current meds. Encouraged to avoid adding salt to her foods.   3. Pure hypercholesterolemia Comments: Previous labs from May 2021 reviewed. LDL 83 and TG 300. She will c/w simvastatin. Advised to increase exercise and avoid sugary beverages/refined carbs.   4. Body mass index (BMI) of 24.0-24.9 in adult     Patient was given opportunity to ask questions. Patient verbalized understanding of the plan and was able to repeat key elements of the plan. All questions were answered to their satisfaction.  Maximino Greenland, MD   I, Maximino Greenland, MD, have reviewed all documentation for this visit. The documentation on 12/16/19 for the exam, diagnosis, procedures, and orders are all accurate and complete.  THE PATIENT IS ENCOURAGED TO PRACTICE SOCIAL DISTANCING DUE TO THE COVID-19 PANDEMIC.

## 2019-12-12 NOTE — Progress Notes (Signed)
This visit occurred during the SARS-CoV-2 public health emergency.  Safety protocols were in place, including screening questions prior to the visit, additional usage of staff PPE, and extensive cleaning of exam room while observing appropriate contact time as indicated for disinfecting solutions.  Subjective:   Holly Peters is a 69 y.o. female who presents for Medicare Annual (Subsequent) preventive examination.  Review of Systems     Cardiac Risk Factors include: advanced age (>77mn, >>15women);diabetes mellitus;hypertension;sedentary lifestyle     Objective:    Today's Vitals   12/12/19 1546  BP: 124/72  Pulse: 83  Temp: 98.2 F (36.8 C)  TempSrc: Oral  SpO2: 99%  Weight: 136 lb 3.2 oz (61.8 kg)  Height: _0  (1.575 m)   Body mass index is 24.91 kg/m.  Advanced Directives 12/12/2019 10/03/2018 11/14/2017 11/11/2017 03/13/2015  Does Patient Have a Medical Advance Directive? Yes Yes No No No  Type of Advance Directive Living will [place] will - - -  Copy of [place] Chart? - No - copy requested - - -    Current Medications (verified) Outpatient Encounter Medications as of 12/12/2019  Medication Sig   allopurinol (ZYLOPRIM) 100 MG tablet Take 1 tablet (100 mg total) by mouth daily.   Cholecalciferol (VITAMIN D) 125 MCG (5000 UT) CAPS Take by mouth.   diltiazem (CARDIZEM CD) 240 MG 24 hr capsule TAKE 1 CAPSULE DAILY   Dulaglutide (TRULICITY) 06.38MVF/6.4PPSOPN Inject 0.75 mg into the skin once a week.   FREESTYLE LITE test strip USE TO CHECK BLOOD SUGAR ONCE DAILY   ibuprofen (ADVIL,MOTRIN) 400 MG tablet Take 400 mg by mouth every 6 (six) hours as needed.   Psyllium (METAMUCIL FIBER PO) Take by mouth.   simvastatin (ZOCOR) 20 MG tablet TAKE 1 TABLET DAILY   telmisartan-hydrochlorothiazide (MICARDIS HCT) 80-25 MG tablet Take 1 tablet by mouth daily.   No facility-administered encounter medications on file as  of 12/12/2019.    Allergies (verified) Patient has no known allergies.   History: Past Medical History:  Diagnosis Date   Diabetes mellitus without complication ([place]    Hyperlipemia    Hypertension    Past Surgical History:  Procedure Laterality Date   ABDOMINAL HYSTERECTOMY     Family History  Problem Relation Age of Onset   Diabetes Mother    Healthy Father    Social History   Socioeconomic History   Marital status: Married    Spouse name: Not on file   Number of children: Not on file   Years of education: Not on file   Highest education level: Not on file  Occupational History   Occupation: retired  Tobacco Use   Smoking status: Never Smoker   Smokeless tobacco: Never Used  VScientific laboratory technicianUse: Never used  Substance and Sexual Activity   Alcohol use: No   Drug use: No   Sexual activity: Not on file  Other Topics Concern   Not on file  Social History Narrative   Not on file   Social Determinants of Health   Financial Resource Strain: Low Risk    Difficulty of Paying Living Expenses: Not hard at all  Food Insecurity: No Food Insecurity   Worried About RCharity fundraiserin the Last Year: Never true   RShadelandin the Last Year: Never true  Transportation Needs: No Transportation Needs   Lack of Transportation (Medical): No   Lack of  Transportation (Non-Medical): No  Physical Activity: Inactive   Days of Exercise per Week: 0 days   Minutes of Exercise per Session: 0 min  Stress: No Stress Concern Present   Feeling of Stress : Not at all  Social Connections:    Frequency of Communication with Friends and Family: Not on file   Frequency of Social Gatherings with Friends and Family: Not on file   Attends Religious Services: Not on Electrical engineer or Organizations: Not on file   Attends Archivist Meetings: Not on file   Marital Status: Not on file    Tobacco Counseling Counseling  given: Not Answered   Clinical Intake:  Pre-visit preparation completed: Yes  Pain : No/denies pain     Nutritional Status: BMI of 19-24  Normal Nutritional Risks: None Diabetes: Yes  How often do you need to have someone help you when you read instructions, pamphlets, or other written materials from your doctor or pharmacy?: 1 - Never What is the last grade level you completed in school?: some college  Diabetic? Yes Nutrition Risk Assessment:  Has the patient had any N/V/D within the last 2 months?  No  Does the patient have any non-healing wounds?  No  Has the patient had any unintentional weight loss or weight gain?  No   Diabetes:  Is the patient diabetic?  Yes  If diabetic, was a CBG obtained today?  No  Did the patient bring in their glucometer from home?  No  How often do you monitor your CBG's? daily   Financial Strains and Diabetes Management:  Are you having any financial strains with the device, your supplies or your medication? No .  Does the patient want to be seen by Chronic Care Management for management of their diabetes?  No  Would the patient like to be referred to a Nutritionist or for Diabetic Management?  No   Diabetic Exams:  Diabetic Eye Exam: Completed 07/19/2019 Diabetic Foot Exam: Overdue, Pt has been advised about the importance in completing this exam. Pt is scheduled for diabetic foot exam on next appointment.   Interpreter Needed?: No  Information entered by :: NAllen LPN   Activities of Daily Living In your present state of health, do you have any difficulty performing the following activities: 12/12/2019  Hearing? N  Vision? N  Difficulty concentrating or making decisions? N  Walking or climbing stairs? N  Dressing or bathing? N  Doing errands, shopping? N  Preparing Food and eating ? N  Using the Toilet? N  In the past six months, have you accidently leaked urine? N  Do you have problems with loss of bowel control? N   Managing your Medications? N  Managing your Finances? N  Housekeeping or managing your Housekeeping? N  Some recent data might be hidden    Patient Care Team: Glendale Chard, MD as PCP - General (Internal Medicine) Rex Kras, Claudette Stapler, RN as [place] any recent [place] you may have received from other than [facility] providers in the past year (date may be approximate).     Assessment:   This is a routine wellness examination for Mar[place].  Hearing/Vision screen  Hearing Screening   _0  _1  _2  _3  _4  _5  _6  _7  _8   Right ear:           Left ear:           Vision Screening Comments: Regular eye exams, Dr.  Groat  Dietary issues and exercise activities discussed: Current Exercise Habits: The patient does not participate in regular exercise at present  Goals      "to get my BP down a little" (pt-stated)      CARE PLAN ENTRY (see longtitudinal plan of care for additional care plan information)  Current Barriers:   Knowledge Deficits related to disease process and Self Health management of HTN  Chronic Disease Management support and education needs related to Essential Hypertension, CKD IV, DM II, HLD  Nurse Case Manager Clinical Goal(s):   09/18/19 New Over the next 90 days, patient will work with the CCM team and PCP to address needs related to disease education and support for improved Self management of HTN  CCM RN CM Interventions:  09/18/19 call completed with patient   Evaluation of current treatment plan related to HTN and patient's adherence to plan as established by provider  Re-educated patient re: target BP <130/80: Re-educated on dietary and exercise recommendations; Determined patient is self monitoring her BP and reports BP is within range   Reviewed medications with patient and discussed indication, dosage and frequency of prescribed antihypertensives; patient reports adherence w/o noted  SE  Advised patient, providing education and rationale, to monitor blood pressure daily and record, calling the CCM team and or PCP for findings outside established parameters  Discussed plans with patient for ongoing care management follow up and provided patient with direct contact information for care management team  Patient Self Care Activities:   Self administers medications as prescribed  Attends all scheduled provider appointments  Calls pharmacy for medication refills  Performs ADL's independently  Performs IADL's independently  Calls provider office for new concerns or questions  Please see past updates related to this goal by clicking on the "Past Updates" button in the selected goal        "to maintain or improve my kidney and diabetes" (pt-stated)      CARE PLAN ENTRY (see longtitudinal plan of care for additional care plan information)  Current Barriers:   Knowledge Deficits related to disease process and Self Health management of DM, CKD  Chronic Disease Management support and education needs related to CKD IV, DM II, HTN, HLD  Nurse Case Manager Clinical Goal(s):   Over the next 30 days, patient will maintain or improve current GFR of 27 and or will maintain or improve current BUN 33, Creatinine of 2.13 (obtained on 02/12/19) Goal Not Met   09/18/19 New Over the next 180 days, patient will work with the CCM team and or PCP to address needs related to disease education and support to improve Self management of DMII and CKD  CCM RN CM Interventions:  10/15/19 Inbound call completed with patient   Evaluation of current treatment plan related to DM II, CKD IV and patient's adherence to plan as established by provider  Reviewed and discussed most recent A1c of 6.8 this has decreased from 7.3%; Re-educated on target A1c <7.0; Re-educated on dietary and exercise recommendations; Educated on daily glycemic control FBS 80-130, < 180 after meals; Educated patient on  15'15' rule   Positive reinforcement given to patient for making efforts to lower her A1c and improve her kidney function as a result; Educated on slightly improved GFR Reinforced importance of drinking plenty of water   Advised patient, providing education and rationale, to check cbg 1-2 daily before meals and record, calling the CCM team and or PCP for findings outside established parameters  Mailed printed  educational materials related to Diabetes and Kidney disease; Salt Substitutes for Kidney patients  Discussed plans with patient for ongoing care management follow up and provided patient with direct contact information for care management team  Patient Self Care Activities:   Self administers medications as prescribed  Attends all scheduled provider appointments  Calls pharmacy for medication refills  Performs ADL's independently  Performs IADL's independently  Calls provider office for new concerns or questions  Please see past updates related to this goal by clicking on the "Past Updates" button in the selected goal        Patient Stated      10/03/2018, wants to maintain      Patient Stated      12/12/2019, stay healthy      Depression Screen PHQ 2/9 Scores 12/12/2019 11/21/2018 10/11/2018 10/03/2018 05/30/2018 01/31/2018  PHQ - 2 Score 0 0 0 0 0 0  PHQ- 9 Score - - - 3 - -    Fall Risk Fall Risk  12/12/2019 11/21/2018 10/11/2018 10/03/2018 05/30/2018  Falls in the past year? 0 0 0 0 1  Comment - - - - -  Number falls in past yr: - - - 0 0  Comment - - - - -  Injury with Fall? - - - - 1  Risk for fall due to : Medication side effect - - Medication side effect -  Follow up Falls evaluation completed;Education provided;Falls prevention discussed - - Falls evaluation completed;Falls prevention discussed -    FALL RISK PREVENTION PERTAINING TO THE HOME:  Any stairs in or around the home? No  If so, are there any without handrails? n/a Home free of loose throw rugs  in walkways, pet beds, electrical cords, etc? Yes  Adequate lighting in your home to reduce risk of falls? Yes   ASSISTIVE DEVICES UTILIZED TO PREVENT FALLS:  Life alert? No  Use of a cane, walker or w/c? No  Grab bars in the bathroom? Yes  Shower chair or bench in shower? Yes  Elevated toilet seat or a handicapped toilet? Yes   TIMED UP AND GO:  Was the test performed? No . .   Gait steady and fast without use of assistive device  Cognitive Function:     6CIT Screen 12/12/2019 10/03/2018  What Year? 0 points 0 points  What month? 0 points 0 points  What time? 0 points 0 points  Count back from 20 0 points 0 points  Months in reverse 0 points 0 points  Repeat phrase 2 points 0 points  Total Score 2 0    Immunizations Immunization History  Administered Date(s) Administered   Fluad Quad(high Dose 65+) 09/29/2018, 09/27/2019   Influenza-Unspecified 09/29/2018   PFIZER SARS-COV-2 Vaccination 02/04/2019, 02/25/2019   Pneumococcal Conjugate-13 10/03/2017   Zoster Recombinat (Shingrix) 02/20/2018, 07/17/2018    TDAP status: Up to date  Flu Vaccine status: Up to date  Pneumococcal vaccine status: Up to date  Covid-19 vaccine status: Completed vaccines  Qualifies for Shingles Vaccine? Yes   Zostavax completed No   Shingrix Completed?: Yes  Screening Tests Health Maintenance  Topic Date Due   FOOT EXAM  10/11/2019   HEMOGLOBIN A1C  03/26/2020   OPHTHALMOLOGY EXAM  07/18/2020   TETANUS/TDAP  11/11/2020   MAMMOGRAM  01/21/2021   COLONOSCOPY  03/24/2027   INFLUENZA VACCINE  Completed   DEXA SCAN  Completed   COVID-19 Vaccine  Completed   Hepatitis C Screening  Completed   PNA vac Low  Risk Adult  Completed    Health Maintenance  Health Maintenance Due  Topic Date Due   FOOT EXAM  10/11/2019    Colorectal cancer screening: Type of screening: Colonoscopy. Completed 03/23/2017. Repeat every 10 years  Mammogram status: Completed 01/22/2019.  Repeat every year  Bone Density status: Completed 02/14/2019. Results reflect: Bone density results: OSTEOPENIA. Repeat every 3 years.  Lung Cancer Screening: (Low Dose CT Chest recommended if Age 47-80 years, 30 pack-year currently smoking OR have quit w/in 15years.) does not qualify.   Lung Cancer Screening Referral: no  Additional Screening:  Hepatitis C Screening: does qualify; Completed 12/08/2012  Vision Screening: Recommended annual ophthalmology exams for early detection of glaucoma and other disorders of the eye. Is the patient up to date with their annual eye exam?  Yes  Who is the provider or what is the name of the office in which the patient attends annual eye exams? Dr. Katy Fitch If pt is not established with a provider, would they like to be referred to a provider to establish care? No .   Dental Screening: Recommended annual dental exams for proper oral hygiene  Community Resource Referral / Chronic Care Management: CRR required this visit?  No   CCM required this visit?  No      Plan:     I have personally reviewed and noted the following in the patients chart:    Medical and social history  Use of alcohol, tobacco or illicit drugs   Current medications and supplements  Functional ability and status  Nutritional status  Physical activity  Advanced directives  List of other physicians  Hospitalizations, surgeries, and ER visits in previous 12 months  Vitals  Screenings to include cognitive, depression, and falls  Referrals and appointments  In addition, I have reviewed and discussed with patient certain preventive protocols, quality metrics, and best practice recommendations. A written personalized care plan for preventive services as well as general preventive health recommendations were provided to patient.     Kellie Simmering, LPN   [id number]/08/[id number]   Nurse Notes:

## 2019-12-13 LAB — CMP14+EGFR
ALT: 16 IU/L (ref 0–32)
AST: 19 IU/L (ref 0–40)
Albumin/Globulin Ratio: 1.5 (ref 1.2–2.2)
Albumin: 4.7 g/dL (ref 3.8–4.8)
Alkaline Phosphatase: 58 IU/L (ref 44–121)
BUN/Creatinine Ratio: 13 (ref 12–28)
BUN: 27 mg/dL (ref 8–27)
Bilirubin Total: 0.4 mg/dL (ref 0.0–1.2)
CO2: 23 mmol/L (ref 20–29)
Calcium: 11 mg/dL — ABNORMAL HIGH (ref 8.7–10.3)
Chloride: 103 mmol/L (ref 96–106)
Creatinine, Ser: 2.14 mg/dL — ABNORMAL HIGH (ref 0.57–1.00)
GFR calc Af Amer: 26 mL/min/{1.73_m2} — ABNORMAL LOW (ref >59)
GFR calc non Af Amer: 23 mL/min/{1.73_m2} — ABNORMAL LOW (ref >59)
Globulin, Total: 3.1 g/dL (ref 1.5–4.5)
Glucose: 92 mg/dL (ref 65–99)
Potassium: 3.6 mmol/L (ref 3.5–5.2)
Sodium: 143 mmol/L (ref 134–144)
Total Protein: 7.8 g/dL (ref 6.0–8.5)

## 2019-12-13 LAB — HEMOGLOBIN A1C
Est. average glucose Bld gHb Est-mCnc: 143 mg/dL
Hgb A1c MFr Bld: 6.6 % — ABNORMAL HIGH (ref 4.8–5.6)

## 2019-12-14 ENCOUNTER — Encounter: Payer: Self-pay | Admitting: Internal Medicine

## 2019-12-17 ENCOUNTER — Other Ambulatory Visit: Payer: Self-pay

## 2019-12-17 ENCOUNTER — Ambulatory Visit: Payer: Self-pay

## 2019-12-17 ENCOUNTER — Telehealth: Payer: Medicare Other

## 2019-12-17 DIAGNOSIS — I129 Hypertensive chronic kidney disease with stage 1 through stage 4 chronic kidney disease, or unspecified chronic kidney disease: Secondary | ICD-10-CM

## 2019-12-17 DIAGNOSIS — I10 Essential (primary) hypertension: Secondary | ICD-10-CM

## 2019-12-17 DIAGNOSIS — N184 Chronic kidney disease, stage 4 (severe): Secondary | ICD-10-CM

## 2019-12-17 DIAGNOSIS — E1122 Type 2 diabetes mellitus with diabetic chronic kidney disease: Secondary | ICD-10-CM

## 2019-12-17 DIAGNOSIS — E7800 Pure hypercholesterolemia, unspecified: Secondary | ICD-10-CM

## 2019-12-21 NOTE — Patient Instructions (Signed)
Visit Information  Goals Addressed      Other   .  Follow My Treatment Plan-Chronic Kidney        Timeframe:  Long-Range Goal Priority:  High Start Date: 12/17/19                            Expected End Date: 03/16/20                     Follow Up Date 01/29/20   - call for medicine refill 2 or 3 days before it runs out - keep follow-up appointments  - increase water intake to 64-80 oz per day unless otherwise directed by kidney doctor  - review printed mailed educational materials related to "salt substitutes for kidney patients"; "diabetes and kidney disease: what to eat?"   Why is this important?    Staying as healthy as you can is very important. This may mean making changes if you smoke, don't exercise or eat poorly.   A healthy lifestyle is an important goal for you.   Following the treatment plan and making changes may be hard.   Try some of these steps to help keep the disease from getting worse.     Notes:     [name]  Monitor and Manage My Blood Sugar-Diabetes Type 2        Timeframe:  Long-Range Goal Priority:  High Start Date:  12/17/19                           Expected End Date:  03/16/20                    Follow Up Date 01/28/20  Over the next 90 days, patient will:  - check blood sugar before and after exercise - check blood sugar if I feel it is too high or too low - take the blood sugar log to all doctor visits - take the blood sugar meter to all doctor visits    Why is this important?    Checking your blood sugar at home helps to keep it from getting very high or very low.   Writing the results in a diary or log helps the doctor know how to care for you.   Your blood sugar log should have the time, date and the results.   Also, write down the amount of insulin or other medicine that you take.   Other information, like what you ate, exercise done and how you were feeling, will also be helpful.     Notes:     .  Track and Manage My Blood  Pressure-Hypertension        Timeframe:  Long-Range Goal Priority:  High Start Date:  12/17/19                           Expected End Date:  03/16/20                     Follow Up Date 01/28/20   - check blood pressure 3 times per week - write blood pressure results in a log or diary  Self administers medications as prescribed Attends all scheduled provider appointments Calls provider office for new concerns, questions, or BP outside discussed parameters Checks BP and records as discussed Follows a low sodium diet/DASH diet  Why is this important?    You won't feel high blood pressure, but it can still hurt your blood vessels.   High blood pressure can cause heart or kidney problems. It can also cause a stroke.   Making lifestyle changes like losing a Shonnie Poudrier weight or eating less salt will help.   Checking your blood pressure at home and at different times of the day can help to control blood pressure.   If the doctor prescribes medicine remember to take it the way the doctor ordered.   Call the office if you cannot afford the medicine or if there are questions about it.     Notes:     .  Track and Manage My Symptoms-Chronic Kidney        Timeframe:  Long-Range Goal Priority:  High Start Date: 12/17/19                            Expected End Date: 03/16/20                      Follow Up Date 01/28/20   - balance periods of rest with activity as needed - exercise at least 2 to 3 times per week - keep all lab appointments - take medicine as prescribed - watch for early signs of feeling worse    Why is this important?    Keeping track of symptoms can help you feel the best. It also helps the doctor stay on top of any changes to the disease. It may also help keep your disease from getting worse.   Taking simple steps can help you cope with symptoms like feeling very tired or itchy skin.     Notes:        The patient verbalized understanding of instructions,  educational materials, and care plan provided today and declined offer to receive copy of patient instructions, educational materials, and care plan.   Telephone follow up appointment with care management team member scheduled for: 01/29/20  Lynne Logan, RN

## 2019-12-21 NOTE — Chronic Care Management (AMB) (Signed)
Chronic Care Management   Follow Up Note   12/17/2019 Name: Holly Peters MRN: [id number] DOB: 1950/02/18  Referred by: Glendale Chard, MD Reason for referral : Chronic Care Management (RN CM FU Call )   Holly Peters is a 69 y.o. year old female who is a primary care patient of Glendale Chard, MD. The CCM team was consulted for assistance with chronic disease management and care coordination needs.    Review of patient status, including review of consultants reports, relevant laboratory and other test results, and collaboration with appropriate care team members and the patient's provider was performed as part of comprehensive patient evaluation and provision of chronic care management services.    SDOH (Social Determinants of Health) assessments performed: No See Care Plan activities for detailed interventions related to [place])   Completed CCM RN CM follow up call to patient for a care plan update.    Outpatient Encounter Medications as of 12/17/2019  Medication Sig  . allopurinol (ZYLOPRIM) 100 MG tablet Take 1 tablet (100 mg total) by mouth daily.  . Cholecalciferol (VITAMIN D) 125 MCG (5000 UT) CAPS Take by mouth.  . diltiazem (CARDIZEM CD) 240 MG 24 hr capsule TAKE 1 CAPSULE DAILY  . Dulaglutide (TRULICITY) 0.01 VC/9.4WH SOPN Inject 0.75 mg into the skin once a week.  [name] FREESTYLE LITE test strip USE TO CHECK BLOOD SUGAR ONCE DAILY  . ibuprofen (ADVIL,MOTRIN) 400 MG tablet Take 400 mg by mouth every 6 (six) hours as needed.  . Psyllium (METAMUCIL FIBER PO) Take by mouth.  . simvastatin (ZOCOR) 20 MG tablet TAKE 1 TABLET DAILY  . telmisartan-hydrochlorothiazide (MICARDIS HCT) 80-25 MG tablet Take 1 tablet by mouth daily.   No facility-administered encounter medications on file as of 12/17/2019.     Objective:  Lab Results  Component Value Date   HGBA1C 6.6 (H) 12/12/2019   HGBA1C 6.8 (H) 09/27/2019   HGBA1C 7.3 (H) 05/31/2019   Lab Results  Component Value  Date   MICROALBUR 30 11/21/2018   LDLCALC 83 05/31/2019   CREATININE 2.14 (H) 12/12/2019   BP Readings from Last 3 Encounters:  12/12/19 124/74  12/12/19 124/72  09/27/19 126/64    Goals Addressed     Patient Care Plan: Diabetes Type 2 (Adult)    Problem Identified: Disease Progression (Diabetes, Type 2)   Priority: High    Long-Range Goal: Disease Progression Prevented or Minimized   Start Date: 12/17/2019  Expected End Date: 03/16/2020  This Visit's Progress: On track  Priority: High  Note:   Objective:  Lab Results  Component Value Date   HGBA1C 6.6 (H) 12/12/2019 .   Lab Results  Component Value Date   CREATININE 2.14 (H) 12/12/2019   CREATININE 1.81 (H) 09/27/2019   CREATININE 2.24 (H) 05/31/2019 .   [name] No results found for: EGFR Current Barriers:  [name] Knowledge Deficits related to basic Diabetes pathophysiology and self care/management . Knowledge Deficits related to medications used for management of diabetes Case Manager Clinical Goal(s):  [name] Over the next 90 days, patient will demonstrate improved adherence to prescribed treatment plan for diabetes self care/management as evidenced by: follow an ADA diet, implement daily exercise routine and take all DM medications exactly as prescribed.  . daily monitoring and recording of CBG  . adherence to ADA/ carb modified diet . exercise 3-5 days/week . adherence to prescribed medication regimen Interventions:  . Provided education to patient about basic DM disease process . Reviewed medications with patient and discussed  importance of medication adherence . Discussed plans with patient for ongoing care management follow up and provided patient with direct contact information for care management team . Provided patient with written educational materials related to hypo and hyperglycemia and importance of correct treatment . Review of patient status, including review of consultants reports, relevant laboratory and other  test results, and medications completed. Patient Goals/Self-Care Activities . Over the next 90 days, patient will:  - Self administers oral medications as prescribed Self administers insulin as prescribed Attends all scheduled provider appointments Checks blood sugars as prescribed and utilize hyper and hypoglycemia protocol as needed Adheres to prescribed ADA/carb modified - check blood sugar before and after exercise - check blood sugar if I feel it is too high or too low - take the blood sugar log to all doctor visits - take the blood sugar meter to all doctor visits   Follow Up Plan: Telephone follow up appointment with care management team member scheduled for: 01/29/20   Patient Care Plan: Hypertension (Adult)    Problem Identified: Disease Progression (Hypertension)   Priority: High    Long-Range Goal: Disease Progression Prevented or Minimized   Start Date: 12/17/2019  Expected End Date: 03/16/2020  This Visit's Progress: On track  Priority: High  Note:   Objective:  . Last practice recorded BP readings:  BP Readings from Last 3 Encounters:  12/12/19 124/74  12/12/19 124/72  09/27/19 126/64 .   [name] Most recent eGFR/CrCl: No results found for: EGFR  No components found for: CRCL Current Barriers:  [name] Knowledge Deficits related to basic understanding of hypertension pathophysiology and self care management . Knowledge Deficits related to understanding of medications prescribed for management of hypertension Case Manager Clinical Goal(s):  [name] Over the next 90 days, patient will demonstrate improved adherence to prescribed treatment plan for hypertension as evidenced by taking all medications as prescribed, monitoring and recording blood pressure as directed, adhering to low sodium/DASH diet Interventions:  . Evaluation of current treatment plan related to hypertension self management and patient's adherence to plan as established by provider. . Provided education to patient  re: stroke prevention, s/s of heart attack and stroke, DASH diet, complications of uncontrolled blood pressure . Reviewed medications with patient and discussed importance of compliance . Discussed plans with patient for ongoing care management follow up and provided patient with direct contact information for care management team . Advised patient, providing education and rationale, to monitor blood pressure daily and record, calling PCP for findings outside established parameters.  . Promote a regular, daily exercise goal of 150 minutes per week of moderate exercise based on tolerance, ability and patient choice; consider referral to physical therapist, community wellness and/or activity program. Patient Goals/Self-Care Activities . Over the next 90  days, patient will:  - Self administers medications as prescribed Attends all scheduled provider appointments Calls provider office for new concerns, questions, or BP outside discussed parameters Checks BP and records as discussed Follows a low sodium diet/DASH diet - check blood pressure 3 times per week - write blood pressure results in a log or diary  Follow Up Plan: Telephone follow up appointment with care management team member scheduled for: 01/29/20   Patient Care Plan: Chronic Kidney (Adult)    Problem Identified: Disease Progression   Priority: High    Long-Range Goal: Disease Progression Prevented or Minimized   Start Date: 12/17/2019  Expected End Date: 03/16/2020  This Visit's Progress: On track  Priority: High  Note:   Current  Barriers:   Ineffective Self Health Maintenance  Currently UNABLE TO independently self manage needs related to chronic health conditions.   Knowledge Deficits related to short term plan for care coordination needs and long term plans for chronic disease management needs Nurse Case Manager Clinical Goal(s):   Over the next 90 days, patient will work with care management team to address care  coordination and chronic disease management needs related to Disease Management  Educational Needs  Care Coordination  Medication Management and Education   Interventions:   Reviewed and discussed current GFR, Educated on stages of CKD  Determined patient is established with [facility] and has an upcoming appointment with Nephrology  Educated patient on importance of increasing water intake to help improve renal function   Educated on salt substitutes for kidney patients  Discuss potential for disease progression based on clinical diagnosis and/or causation and other risk factors including race, ethnicity, age, comorbidities, lifestyle.   Encourage lifestyle changes including maintaining a healthy weight, exercise and activity, limiting alcohol consumption, smoking cessation to improve long-term outcomes.  Patient Goals/Self Care Activities: - balance periods of rest with activity as needed - exercise at least 2 to 3 times per week - keep all lab appointments - take medicine as prescribed - watch for early signs of feeling worse - call for medicine refill 2 or 3 days before it runs out - keep follow-up appointments  - increase water intake to 64-80 oz per day unless otherwise directed by kidney doctor  - review printed mailed educational materials related to "salt substitutes for kidney patients"; "diabetes and kidney disease: what to eat?"  Follow Up Plan: Telephone follow up appointment with care management team member scheduled for: 01/29/20    Plan:   Telephone follow up appointment with care management team member scheduled for: 01/29/20  Barb Merino, RN, BSN, CCM Care Management Coordinator [facility]  Direct Phone: [phone]

## 2020-01-13 IMAGING — CT CT MAXILLOFACIAL W/O CM
3 series · 16 of 47 positions shown, 19 images · non-contrast
Comparison: None.

CLINICAL DATA: Facial trauma, the patient tripped on a rockand
struck the face.

EXAM:
CT MAXILLOFACIAL WITHOUT CONTRAST
TECHNIQUE: Multidetector CT imaging of the maxillofacial structures was
performed. Multiplanar CT image reconstructions were also generated.

[Series 2: max soft · axial · 0.33mm/px · z∈[+965,+1085]mm · 10 of 70 slices shown, 13 images]
[im 5/70  brain]
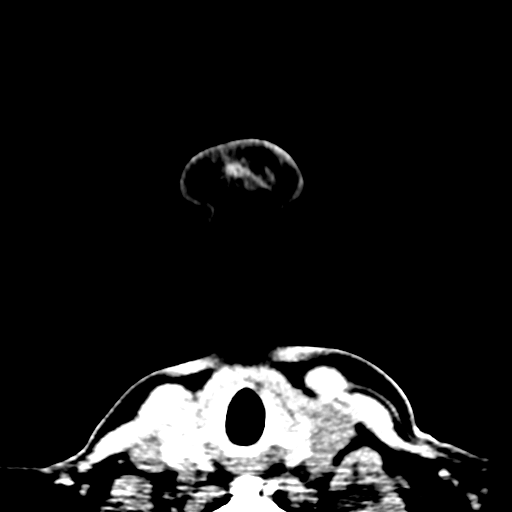
[im 5/70  bone]
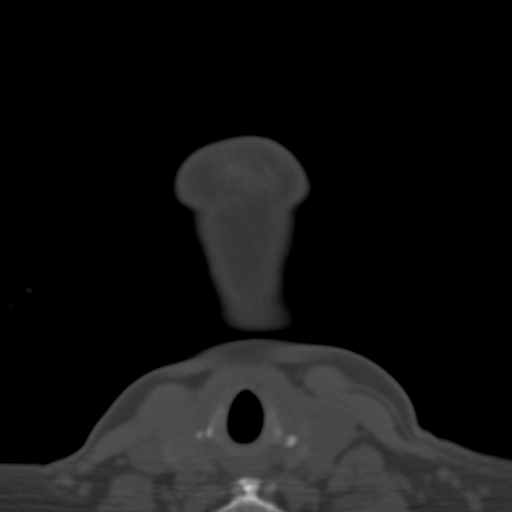
[im 12/70  bone]
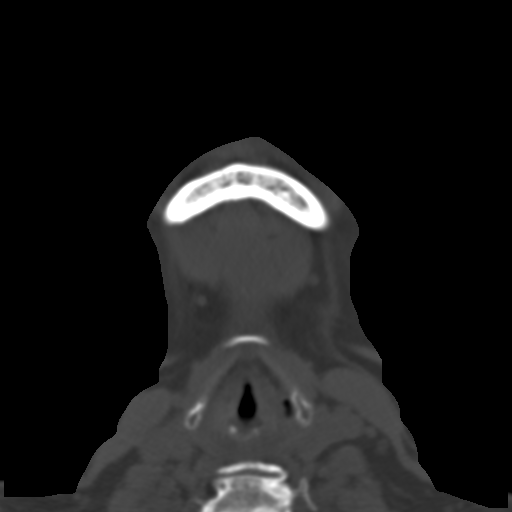
[im 20/70  bone]
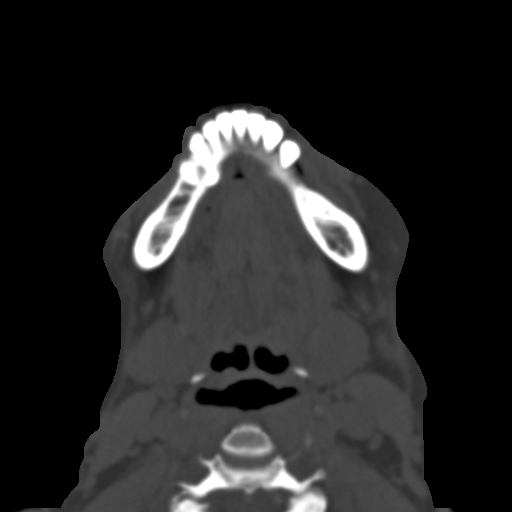
[im 24/70  bone]
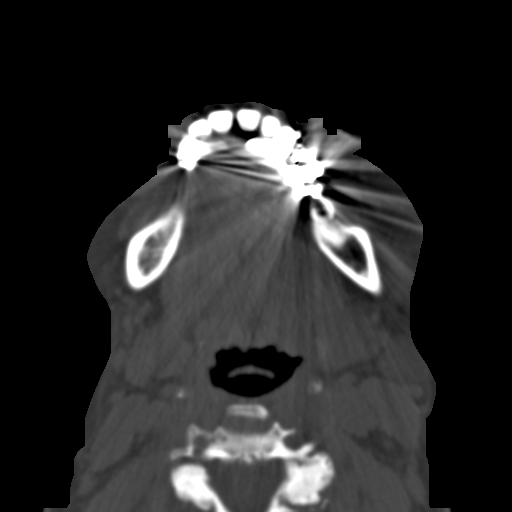
[im 31/70  brain]
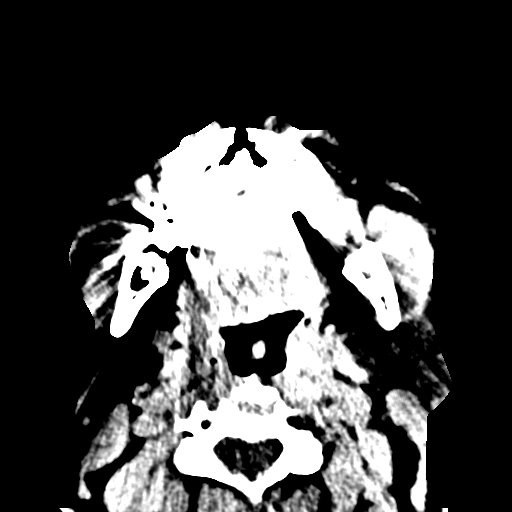
[im 31/70  bone]
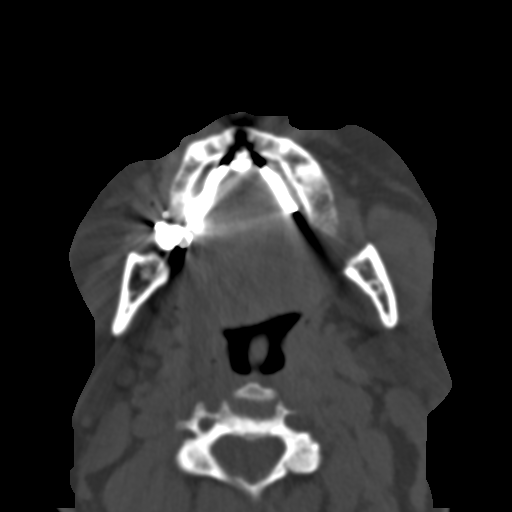
[im 39/70  bone]
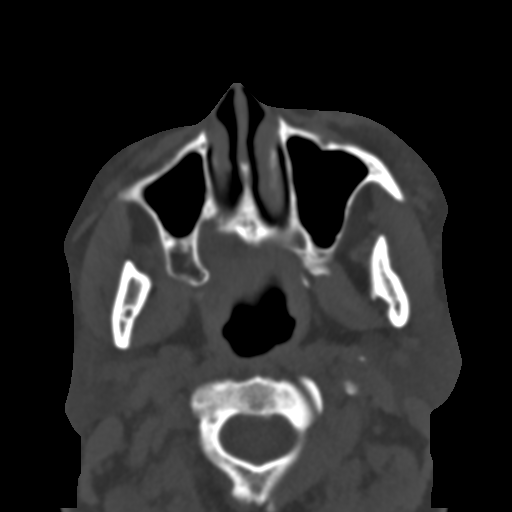
[im 46/70  bone]
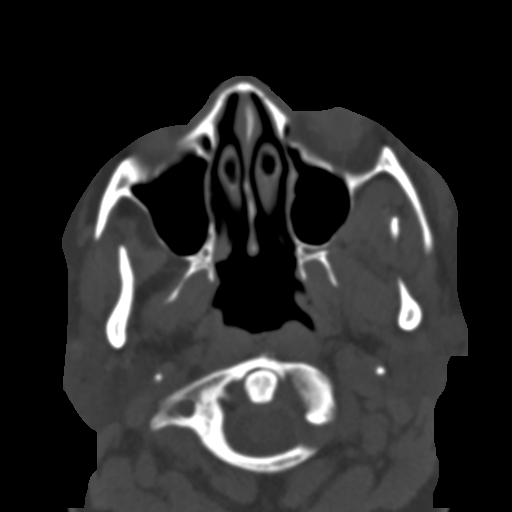
[im 53/70  bone]
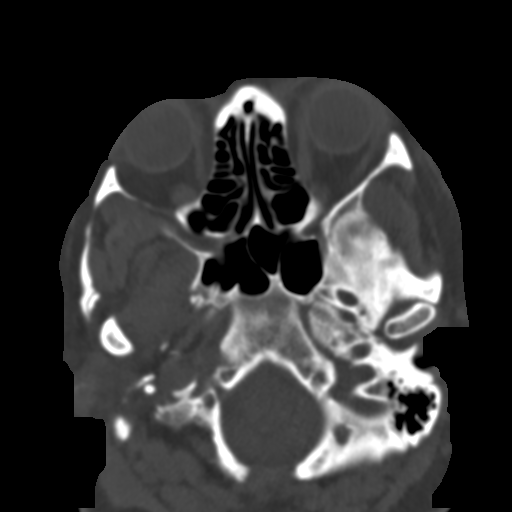
[im 58/70  brain]
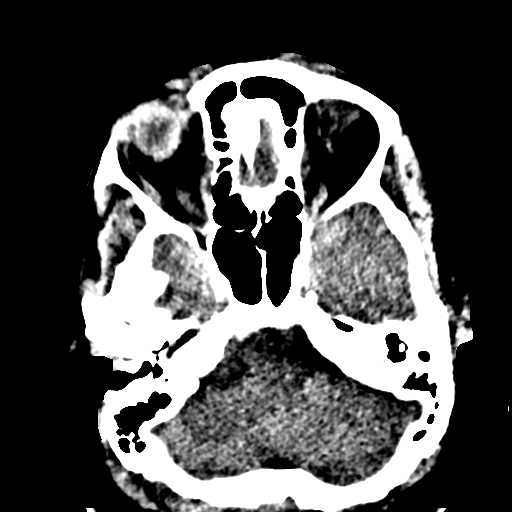
[im 58/70  bone]
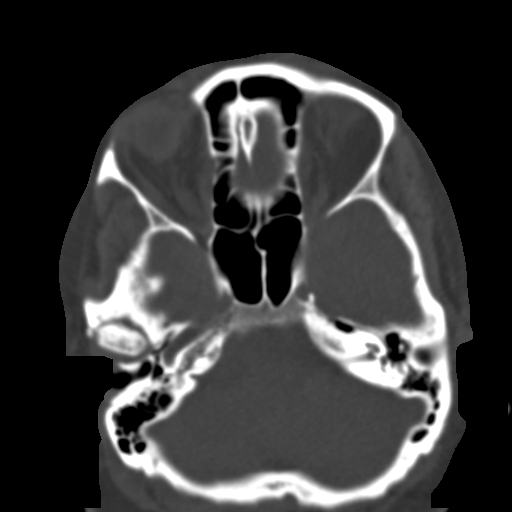
[im 65/70  bone]
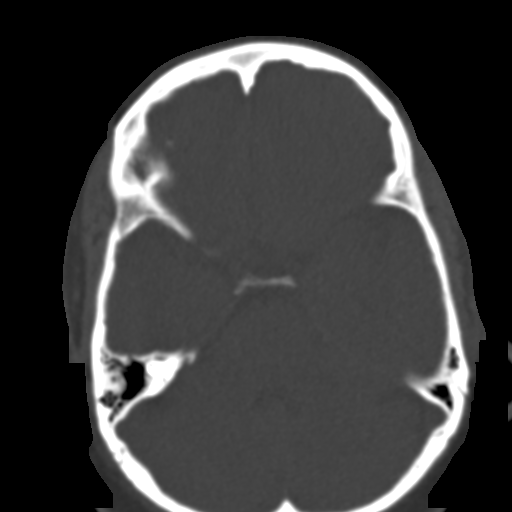

[Series 4: coronal soft · coronal · 0.34mm/px · 3 of 57 slices shown]
[im 19/57  bone]
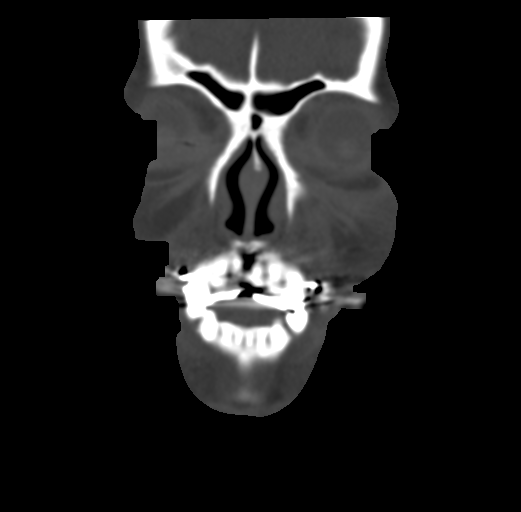
[im 25/57  bone]
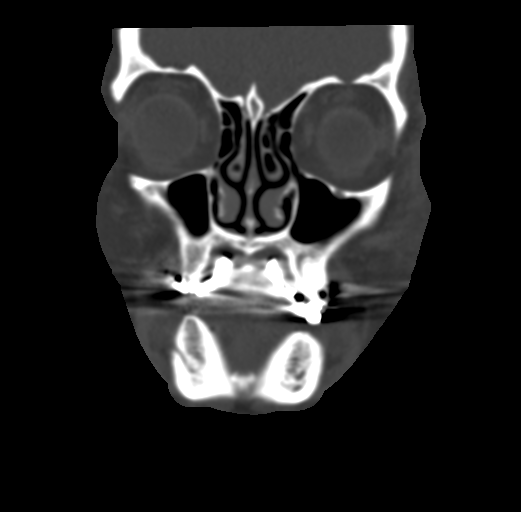
[im 32/57  bone]
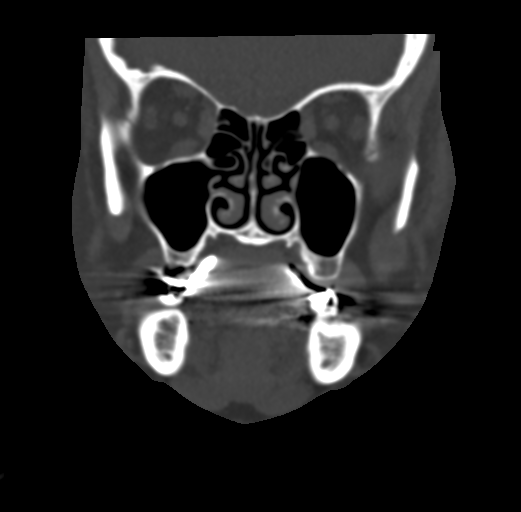

[Series 5: sagittal soft · sagittal · 0.28mm/px · 3 of 85 slices shown]
[im 29/85  bone]
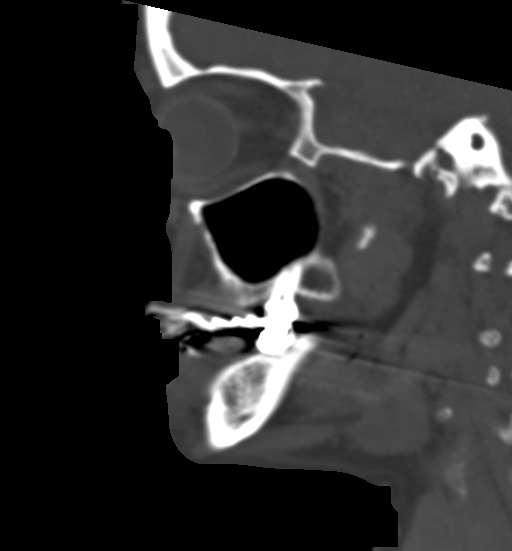
[im 43/85  bone]
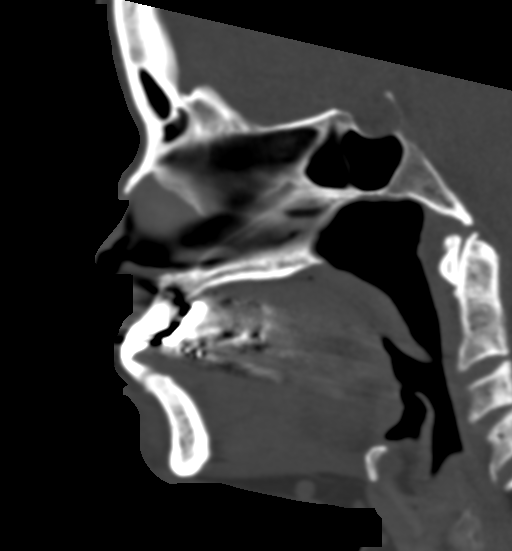
[im 57/85  bone]
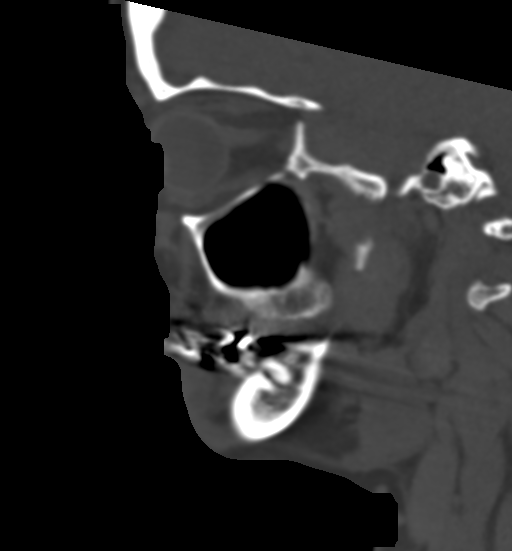

[16 of 47 positions shown; findings below may reference images not displayed]

FINDINGS: Osseous: No facial fracture is identified.

There is notable and prominent degeneration at the right TMJ with
loss of articular space, spurring, probably some volume loss in the
mandibular condyle, and fragmented spurring which appears to be
chronic.

There is evidence of cervical spondylosis and degenerative disc
disease with loss of disc height at C5-6 and C6-7, and notable facet
spurring at C4-5 where there is about 2.5 mm of anterolisthesis.
This is probably degenerative anterolisthesis but this level is only
partially included on today's maxillofacial CT which was not
optimized to assess the cervical spine. No discrete fracture the
visualized part of the cervical spine is evident.

Orbits: No significant intraorbital abnormality.

Sinuses: Unremarkable

Soft tissues: Left infraorbital abnormal soft tissue
swelling/bruising as shown on image 35/2. There is potentially soft
tissue swelling along the chin.

Limited intracranial: Unremarkable
IMPRESSION: 1. Left facial bruising/soft tissue swelling but no acute facial
fracture.
2. Severe right TMJ arthropathy. There appears to be some
chronically fragmented spurring anteriorly along the TMJ region.
3. Cervical spondylosis and degenerative disc disease. There is
about 2.5 mm of anterolisthesis at C4-5 which is most likely to be
degenerative given the partially visualized facet spurring at this
level. I do not see any prevertebral soft tissue swelling along the
visualized portion of the upper cervical spine.

## 2020-01-15 DIAGNOSIS — H5203 Hypermetropia, bilateral: Secondary | ICD-10-CM | POA: Diagnosis not present

## 2020-01-15 DIAGNOSIS — H25813 Combined forms of age-related cataract, bilateral: Secondary | ICD-10-CM | POA: Diagnosis not present

## 2020-01-15 DIAGNOSIS — H524 Presbyopia: Secondary | ICD-10-CM | POA: Diagnosis not present

## 2020-01-15 DIAGNOSIS — H52202 Unspecified astigmatism, left eye: Secondary | ICD-10-CM | POA: Diagnosis not present

## 2020-01-15 DIAGNOSIS — E119 Type 2 diabetes mellitus without complications: Secondary | ICD-10-CM | POA: Diagnosis not present

## 2020-01-15 LAB — HM DIABETES EYE EXAM

## 2020-01-28 DIAGNOSIS — Z1231 Encounter for screening mammogram for malignant neoplasm of breast: Secondary | ICD-10-CM | POA: Diagnosis not present

## 2020-01-28 LAB — HM MAMMOGRAPHY

## 2020-01-29 ENCOUNTER — Telehealth: Payer: Medicare Other

## 2020-01-29 ENCOUNTER — Encounter: Payer: Self-pay | Admitting: Internal Medicine

## 2020-01-31 ENCOUNTER — Encounter: Payer: Self-pay | Admitting: Internal Medicine

## 2020-02-21 ENCOUNTER — Other Ambulatory Visit: Payer: Self-pay

## 2020-02-21 ENCOUNTER — Encounter: Payer: Self-pay | Admitting: Internal Medicine

## 2020-02-21 MED ORDER — ALLOPURINOL 100 MG PO TABS
100.0000 mg | ORAL_TABLET | Freq: Every day | ORAL | 1 refills | Status: DC
Start: 2020-02-21 — End: 2020-08-21

## 2020-03-06 ENCOUNTER — Telehealth: Payer: Medicare Other

## 2020-03-22 ENCOUNTER — Other Ambulatory Visit: Payer: Self-pay | Admitting: Internal Medicine

## 2020-04-21 ENCOUNTER — Encounter: Payer: Self-pay | Admitting: Internal Medicine

## 2020-04-21 ENCOUNTER — Other Ambulatory Visit: Payer: Self-pay

## 2020-04-21 ENCOUNTER — Ambulatory Visit (INDEPENDENT_AMBULATORY_CARE_PROVIDER_SITE_OTHER): Payer: Medicare Other | Admitting: Internal Medicine

## 2020-04-21 VITALS — BP 134/80 | HR 78 | Temp 98.4°F | Ht 62.0 in | Wt 135.0 lb

## 2020-04-21 DIAGNOSIS — E1122 Type 2 diabetes mellitus with diabetic chronic kidney disease: Secondary | ICD-10-CM

## 2020-04-21 DIAGNOSIS — I129 Hypertensive chronic kidney disease with stage 1 through stage 4 chronic kidney disease, or unspecified chronic kidney disease: Secondary | ICD-10-CM

## 2020-04-21 DIAGNOSIS — N184 Chronic kidney disease, stage 4 (severe): Secondary | ICD-10-CM

## 2020-04-21 DIAGNOSIS — E7800 Pure hypercholesterolemia, unspecified: Secondary | ICD-10-CM

## 2020-04-21 NOTE — Progress Notes (Signed)
I,Katawbba Wiggins,acting as a Education administrator for Maximino Greenland, MD.,have documented all relevant documentation on the behalf of Maximino Greenland, MD,as directed by  Maximino Greenland, MD while in the presence of Maximino Greenland, MD.  This visit occurred during the SARS-CoV-2 public health emergency.  Safety protocols were in place, including screening questions prior to the visit, additional usage of staff PPE, and extensive cleaning of exam room while observing appropriate contact time as indicated for disinfecting solutions.  Subjective:     Patient ID: Holly Peters , female    DOB: 1950/01/20 , 70 y.o.   MRN: [id number]   Chief Complaint  Patient presents with  . Diabetes  . Hypertension    HPI  She reports for diabetes and blood pressure check today. She reports compliance with meds. She denies headaches, chest pain and shortness of breath.  She states her sugars run between 90-114.   Diabetes She presents for her follow-up diabetic visit. She has type 2 diabetes mellitus. Her disease course has been stable. There are no hypoglycemic associated symptoms. Pertinent negatives for diabetes include no blurred vision and no chest pain. There are no hypoglycemic complications. Risk factors for coronary artery disease include diabetes mellitus, dyslipidemia, hypertension, sedentary lifestyle and post-menopausal. Her weight is stable. She is following a diabetic diet. Her breakfast blood glucose is taken between 8-9 am. Her breakfast blood glucose range is generally 90-110 mg/dl. Eye exam is current.  Hypertension This is a chronic problem. The current episode started more than 1 year ago. The problem has been gradually improving since onset. The problem is controlled. Pertinent negatives include no blurred vision or chest pain.     Past Medical History:  Diagnosis Date  . Diabetes mellitus without complication ([place])   . Hyperlipemia   . Hypertension      Family History  Problem Relation  Age of Onset  . Diabetes Mother   . Healthy Father      Current Outpatient Medications:  .  allopurinol (ZYLOPRIM) 100 MG tablet, Take 1 tablet (100 mg total) by mouth daily., Disp: 90 tablet, Rfl: 1 .  diltiazem (CARDIZEM CD) 240 MG 24 hr capsule, TAKE 1 CAPSULE DAILY, Disp: 90 capsule, Rfl: 3 .  Dulaglutide (TRULICITY) 1.79 XT/0.5WP SOPN, Inject 0.75 mg into the skin once a week., Disp: 6 mL, Rfl: 4 .  FREESTYLE LITE test strip, USE TO CHECK BLOOD SUGAR ONCE DAILY, Disp: 100 each, Rfl: 3 .  ibuprofen (ADVIL,MOTRIN) 400 MG tablet, Take 400 mg by mouth every 6 (six) hours as needed., Disp: , Rfl:  .  Psyllium (METAMUCIL FIBER PO), Take by mouth. prn, Disp: , Rfl:  .  simvastatin (ZOCOR) 20 MG tablet, TAKE 1 TABLET DAILY, Disp: 90 tablet, Rfl: 3 .  telmisartan-hydrochlorothiazide (MICARDIS HCT) 80-25 MG tablet, Take 1 tablet by mouth daily., Disp: 90 tablet, Rfl: 2 .  Cholecalciferol (VITAMIN D) 125 MCG (5000 UT) CAPS, Take by mouth., Disp: , Rfl:    No Known Allergies   Review of Systems  Constitutional: Negative.   Eyes: Negative for blurred vision.  Respiratory: Negative.   Cardiovascular: Negative.  Negative for chest pain.  Gastrointestinal: Negative.   Psychiatric/Behavioral: Negative.   All other systems reviewed and are negative.    Today's Vitals   04/21/20 0954  BP: 134/80  Pulse: 78  Temp: 98.4 F (36.9 C)  TempSrc: Oral  Weight: 135 lb (61.2 kg)  Height: '5\' 2"'  (1.575 m)  PainSc: 0-No pain  Body mass index is 24.69 kg/m.   Objective:  Physical Exam Vitals and nursing note reviewed.  Constitutional:      Appearance: Normal appearance.  HENT:     Head: Normocephalic and atraumatic.     Nose:     Comments: Masked     Mouth/Throat:     Comments: Masked  Cardiovascular:     Rate and Rhythm: Normal rate and regular rhythm.     Heart sounds: Normal heart sounds.  Pulmonary:     Effort: Pulmonary effort is normal.     Breath sounds: Normal breath sounds.   Musculoskeletal:     Cervical back: Normal range of motion.  Skin:    General: Skin is warm.  Neurological:     General: No focal deficit present.     Mental Status: She is alert.  Psychiatric:        Mood and Affect: Mood normal.        Behavior: Behavior normal.         Assessment And Plan:     1. Type 2 diabetes mellitus with stage 4 chronic kidney disease, without long-term current use of insulin (HCC) Comments: Diabetic foot exam was performed. I will check labs as listed below. I will adjust meds as needed. - BMP8+EGFR - Hemoglobin A1c - Protein electrophoresis, serum - Parathyroid Hormone, Intact w/Ca - Phosphorus  2. Parenchymal renal hypertension, stage 1 through stage 4 or unspecified chronic kidney disease Comments: Chronic, fair control. I will not make any changes today. She has upcoming appt in May with Renal.   3. Pure hypercholesterolemia Comments: I will check fasting lipid panel today. She is encouraged to increase her daily activity, avoid fried foods and avoid foods with trans fats.  - Lipid panel   Patient was given opportunity to ask questions. Patient verbalized understanding of the plan and was able to repeat key elements of the plan. All questions were answered to their satisfaction.   I, Maximino Greenland, MD, have reviewed all documentation for this visit. The documentation on 04/21/20 for the exam, diagnosis, procedures, and orders are all accurate and complete.   IF YOU HAVE BEEN REFERRED TO A SPECIALIST, IT MAY TAKE 1-2 WEEKS TO SCHEDULE/PROCESS THE REFERRAL. IF YOU HAVE NOT HEARD FROM US/SPECIALIST IN TWO WEEKS, PLEASE GIVE [place] A CALL AT [phone] X [phone].   THE PATIENT IS ENCOURAGED TO PRACTICE SOCIAL DISTANCING DUE TO THE COVID-19 PANDEMIC.

## 2020-04-21 NOTE — Patient Instructions (Signed)
Diabetes Mellitus and Foot Care Foot care is an important part of your health, especially when you have diabetes. Diabetes may cause you to have problems because of poor blood flow (circulation) to your feet and legs, which can cause your skin to:  Become thinner and drier.  Break more easily.  Heal more slowly.  Peel and crack. You may also have nerve damage (neuropathy) in your legs and feet, causing decreased feeling in them. This means that you may not notice minor injuries to your feet that could lead to more serious problems. Noticing and addressing any potential problems early is the best way to prevent future foot problems. How to care for your feet Foot hygiene  Wash your feet daily with warm water and mild soap. Do not use hot water. Then, pat your feet and the areas between your toes until they are completely dry. Do not soak your feet as this can dry your skin.  Trim your toenails straight across. Do not dig under them or around the cuticle. File the edges of your nails with an emery board or nail file.  Apply a moisturizing lotion or petroleum jelly to the skin on your feet and to dry, brittle toenails. Use lotion that does not contain alcohol and is unscented. Do not apply lotion between your toes.   Shoes and socks  Wear clean socks or stockings every day. Make sure they are not too tight. Do not wear knee-high stockings since they may decrease blood flow to your legs.  Wear shoes that fit properly and have enough cushioning. Always look in your shoes before you put them on to be sure there are no objects inside.  To break in new shoes, wear them for just a few hours a day. This prevents injuries on your feet. Wounds, scrapes, corns, and calluses  Check your feet daily for blisters, cuts, bruises, sores, and redness. If you cannot see the bottom of your feet, use a mirror or ask someone for help.  Do not cut corns or calluses or try to remove them with medicine.  If you  find a minor scrape, cut, or break in the skin on your feet, keep it and the skin around it clean and dry. You may clean these areas with mild soap and water. Do not clean the area with peroxide, alcohol, or iodine.  If you have a wound, scrape, corn, or callus on your foot, look at it several times a day to make sure it is healing and not infected. Check for: ? Redness, swelling, or pain. ? Fluid or blood. ? Warmth. ? Pus or a bad smell.   General tips  Do not cross your legs. This may decrease blood flow to your feet.  Do not use heating pads or hot water bottles on your feet. They may burn your skin. If you have lost feeling in your feet or legs, you may not know this is happening until it is too late.  Protect your feet from hot and cold by wearing shoes, such as at the beach or on hot pavement.  Schedule a complete foot exam at least once a year (annually) or more often if you have foot problems. Report any cuts, sores, or bruises to your health care provider immediately. Where to find more information  American Diabetes Association: www.diabetes.org  Association of Diabetes Care & Education Specialists: www.diabeteseducator.org Contact a health care provider if:  You have a medical condition that increases your risk of infection and   you have any cuts, sores, or bruises on your feet.  You have an injury that is not healing.  You have redness on your legs or feet.  You feel burning or tingling in your legs or feet.  You have pain or cramps in your legs and feet.  Your legs or feet are numb.  Your feet always feel cold.  You have pain around any toenails. Get help right away if:  You have a wound, scrape, corn, or callus on your foot and: ? You have pain, swelling, or redness that gets worse. ? You have fluid or blood coming from the wound, scrape, corn, or callus. ? Your wound, scrape, corn, or callus feels warm to the touch. ? You have pus or a bad smell coming from  the wound, scrape, corn, or callus. ? You have a fever. ? You have a red line going up your leg. Summary  Check your feet every day for blisters, cuts, bruises, sores, and redness.  Apply a moisturizing lotion or petroleum jelly to the skin on your feet and to dry, brittle toenails.  Wear shoes that fit properly and have enough cushioning.  If you have foot problems, report any cuts, sores, or bruises to your health care provider immediately.  Schedule a complete foot exam at least once a year (annually) or more often if you have foot problems. This information is not intended to replace advice given to you by your health care provider. Make sure you discuss any questions you have with your health care provider. Document Revised: 07/12/2019 Document Reviewed: 07/12/2019 Elsevier Patient Education  2021 [employer] Inc[employer]  

## 2020-04-23 ENCOUNTER — Ambulatory Visit (INDEPENDENT_AMBULATORY_CARE_PROVIDER_SITE_OTHER): Payer: Medicare Other

## 2020-04-23 ENCOUNTER — Telehealth: Payer: Medicare Other

## 2020-04-23 DIAGNOSIS — I129 Hypertensive chronic kidney disease with stage 1 through stage 4 chronic kidney disease, or unspecified chronic kidney disease: Secondary | ICD-10-CM

## 2020-04-23 DIAGNOSIS — E1122 Type 2 diabetes mellitus with diabetic chronic kidney disease: Secondary | ICD-10-CM | POA: Diagnosis not present

## 2020-04-23 DIAGNOSIS — I10 Essential (primary) hypertension: Secondary | ICD-10-CM

## 2020-04-23 DIAGNOSIS — N184 Chronic kidney disease, stage 4 (severe): Secondary | ICD-10-CM | POA: Diagnosis not present

## 2020-04-23 DIAGNOSIS — E7800 Pure hypercholesterolemia, unspecified: Secondary | ICD-10-CM

## 2020-04-23 LAB — PTH, INTACT AND CALCIUM: PTH: 54 pg/mL (ref 15–65)

## 2020-04-23 LAB — BMP8+EGFR
BUN/Creatinine Ratio: 17 (ref 12–28)
BUN: 38 mg/dL — ABNORMAL HIGH (ref 8–27)
CO2: 21 mmol/L (ref 20–29)
Calcium: 10.1 mg/dL (ref 8.7–10.3)
Chloride: 108 mmol/L — ABNORMAL HIGH (ref 96–106)
Creatinine, Ser: 2.3 mg/dL — ABNORMAL HIGH (ref 0.57–1.00)
Glucose: 111 mg/dL — ABNORMAL HIGH (ref 65–99)
Potassium: 4.7 mmol/L (ref 3.5–5.2)
Sodium: 144 mmol/L (ref 134–144)
eGFR: 22 mL/min/{1.73_m2} — ABNORMAL LOW (ref >59)

## 2020-04-23 LAB — PROTEIN ELECTROPHORESIS, SERUM
A/G Ratio: 1.3 (ref 0.7–1.7)
Albumin ELP: 4.3 g/dL (ref 2.9–4.4)
Alpha 1: 0.2 g/dL (ref 0.0–0.4)
Alpha 2: 0.8 g/dL (ref 0.4–1.0)
Beta: 1 g/dL (ref 0.7–1.3)
Gamma Globulin: 1.2 g/dL (ref 0.4–1.8)
Globulin, Total: 3.2 g/dL (ref 2.2–3.9)
Total Protein: 7.5 g/dL (ref 6.0–8.5)

## 2020-04-23 LAB — LIPID PANEL
Chol/HDL Ratio: 3.1 ratio (ref 0.0–4.4)
Cholesterol, Total: 174 mg/dL (ref 100–199)
HDL: 56 mg/dL (ref >39)
LDL Chol Calc (NIH): 91 mg/dL (ref 0–99)
Triglycerides: 154 mg/dL — ABNORMAL HIGH (ref 0–149)
VLDL Cholesterol Cal: 27 mg/dL (ref 5–40)

## 2020-04-23 LAB — HEMOGLOBIN A1C
Est. average glucose Bld gHb Est-mCnc: 143 mg/dL
Hgb A1c MFr Bld: 6.6 % — ABNORMAL HIGH (ref 4.8–5.6)

## 2020-04-23 LAB — PHOSPHORUS: Phosphorus: 3.8 mg/dL (ref 3.0–4.3)

## 2020-05-03 NOTE — Patient Instructions (Signed)
Goals Addressed    . Follow My Treatment Plan-Chronic Kidney   On track    Timeframe:  Long-Range Goal Priority:  High Start Date: 04/23/20                           Expected End Date: 10/23/20                     Follow Up Date: 08/25/20   - call for medicine refill 2 or 3 days before it runs out - keep follow-up appointments  - increase water intake to 64 oz per day unless otherwise directed by kidney doctor    Why is this important?    Staying as healthy as you can is very important. This may mean making changes if you smoke, don't exercise or eat poorly.   A healthy lifestyle is an important goal for you.   Following the treatment plan and making changes may be hard.   Try some of these steps to help keep the disease from getting worse.     Notes:     [name] Monitor and Manage My Blood Sugar-Diabetes Type 2   On track    Timeframe:  Long-Range Goal Priority:  High Start Date:  04/23/20                          Expected End Date: 10/23/20                    Follow Up Date: 08/25/20  - check blood sugar before and after exercise - check blood sugar if I feel it is too high or too low - take the blood sugar log to all doctor visits - take the blood sugar meter to all doctor visits    Why is this important?    Checking your blood sugar at home helps to keep it from getting very high or very low.   Writing the results in a diary or log helps the doctor know how to care for you.   Your blood sugar log should have the time, date and the results.   Also, write down the amount of insulin or other medicine that you take.   Other information, like what you ate, exercise done and how you were feeling, will also be helpful.     Notes:     . Track and Manage My Blood Pressure-Hypertension   On track    Timeframe:  Long-Range Goal Priority:  High Start Date:  04/23/20                          Expected End Date:  10/23/20                     Follow Up Date: 08/25/20   - check  blood pressure 3 times per week - write blood pressure results in a log or diary  Self administers medications as prescribed Attends all scheduled provider appointments Calls provider office for new concerns, questions, or BP outside discussed parameters Checks BP and records as discussed Follows a low sodium diet/DASH diet   Why is this important?    You won't feel high blood pressure, but it can still hurt your blood vessels.   High blood pressure can cause heart or kidney problems. It can also cause a stroke.   Making lifestyle  changes like losing a Tocara Mennen weight or eating less salt will help.   Checking your blood pressure at home and at different times of the day can help to control blood pressure.   If the doctor prescribes medicine remember to take it the way the doctor ordered.   Call the office if you cannot afford the medicine or if there are questions about it.     Notes:     . Track and Manage My Symptoms-Chronic Kidney   On track    Timeframe:  Long-Range Goal Priority:  High Start Date: 04/23/20                          Expected End Date: 10/23/20                      Follow Up Date: 08/25/20   - balance periods of rest with activity as needed - exercise at least 2 to 3 times per week - keep all lab appointments - take medicine as prescribed - watch for early signs of feeling worse    Why is this important?    Keeping track of symptoms can help you feel the best. It also helps the doctor stay on top of any changes to the disease. It may also help keep your disease from getting worse.   Taking simple steps can help you cope with symptoms like feeling very tired or itchy skin.     Notes:

## 2020-05-03 NOTE — Chronic Care Management (AMB) (Signed)
Chronic Care Management   CCM RN Visit Note  04/23/2020 Name: Holly Peters MRN: [id number] DOB: 06/29/1950  Subjective: Holly Peters is a 70 y.o. year old female who is a primary care patient of Glendale Chard, MD. The care management team was consulted for assistance with disease management and care coordination needs.    Engaged with patient by telephone for follow up visit in response to provider referral for case management and/or care coordination services.   Consent to Services:  The patient was given information about Chronic Care Management services, agreed to services, and gave verbal consent prior to initiation of services.  Please see initial visit note for detailed documentation.   Patient agreed to services and verbal consent obtained.   Assessment: Review of patient past medical history, allergies, medications, health status, including review of consultants reports, laboratory and other test data, was performed as part of comprehensive evaluation and provision of chronic care management services.   SDOH (Social Determinants of Health) assessments and interventions performed:  Yes, no acute needs identified   CCM Care Plan  No Known Allergies  Outpatient Encounter Medications as of 04/23/2020  Medication Sig  . allopurinol (ZYLOPRIM) 100 MG tablet Take 1 tablet (100 mg total) by mouth daily.  [name] diltiazem (CARDIZEM CD) 240 MG 24 hr capsule TAKE 1 CAPSULE DAILY  . Dulaglutide (TRULICITY) 4.62 VO/3.5KK SOPN Inject 0.75 mg into the skin once a week.  [name] FREESTYLE LITE test strip USE TO CHECK BLOOD SUGAR ONCE DAILY  . ibuprofen (ADVIL,MOTRIN) 400 MG tablet Take 400 mg by mouth every 6 (six) hours as needed.  . Psyllium (METAMUCIL FIBER PO) Take by mouth. prn  . simvastatin (ZOCOR) 20 MG tablet TAKE 1 TABLET DAILY  . telmisartan-hydrochlorothiazide (MICARDIS HCT) 80-25 MG tablet Take 1 tablet by mouth daily.   No facility-administered encounter medications on  file as of 04/23/2020.    Patient Active Problem List   Diagnosis Date Noted  . Quadriceps tendinitis 08/21/2019  . Great toe pain, left 11/03/2018  . Type 2 diabetes mellitus with stage 3 chronic kidney disease, without long-term current use of insulin ([place]) 01/31/2018  . Hypertensive nephropathy 01/31/2018  . Pure hypercholesterolemia 01/31/2018    Conditions to be addressed/monitored:Type II Diabetes, Parenchymal renal hypertension, stage 1 through 4 or unspecified chronic kidney disease, Essential Hypertension, Pure Hypercholesterolemia  Care Plan : Diabetes Type 2 (Adult)  Updates made by Lynne Logan, RN since 05/03/2020 12:00 AM    Problem: Disease Progression (Diabetes, Type 2)   Priority: High    Long-Range Goal: Disease Progression Prevented or Minimized   Start Date: 04/23/2020  Expected End Date: 10/23/2020  Recent Progress: On track  Priority: High  Note:   Objective:  Lab Results  Component Value Date   HGBA1C 6.6 (H) 12/12/2019 .   Lab Results  Component Value Date   CREATININE 2.14 (H) 12/12/2019   CREATININE 1.81 (H) 09/27/2019   CREATININE 2.24 (H) 05/31/2019 .   [name] No results found for: EGFR Current Barriers:  [name] Knowledge Deficits related to basic Diabetes pathophysiology and self care/management . Knowledge Deficits related to medications used for management of diabetes Case Manager Clinical Goal(s):  [name] Patient will demonstrate improved adherence to prescribed treatment plan for diabetes self care/management as evidenced by: follow an ADA diet, implement daily exercise routine and take all DM medications exactly as prescribed.  . daily monitoring and recording of CBG  . adherence to ADA/ carb modified diet . exercise 3-5  days/week . adherence to prescribed medication regimen Interventions:   Collaboration with Glendale Chard, MD regarding development and update of comprehensive plan of care as evidenced by provider attestation and  co-signature  Inter-disciplinary care team collaboration (see longitudinal plan of care)  04/23/20 completed outbound successful call to patient  . Provided education to patient about basic DM disease process . Review of patient status, including review of consultants reports, relevant laboratory and other test results, and medications completed. . Educated patient on dietary and exercise recommendations; daily glycemic control FBS 80-130, <180 after meals;15'15' rule  . Reviewed medications with patient and discussed importance of medication adherence . Confirmed patient received and reviewed written educational materials related to hypo and hyperglycemia and importance of correct treatment . Discussed plans with patient for ongoing care management follow up and provided patient with direct contact information for care management team Patient Goals/Self-Care Activities Self administers oral medications as prescribed Self administers insulin as prescribed Attends all scheduled provider appointments Checks blood sugars as prescribed and utilize hyper and hypoglycemia protocol as needed Adheres to prescribed ADA/carb modified - check blood sugar before and after exercise - check blood sugar if I feel it is too high or too low - take the blood sugar log to all doctor visits - take the blood sugar meter to all doctor visits   Follow Up Plan: Telephone follow up appointment with care management team member scheduled for: 08/25/20   Care Plan : Hypertension (Adult)  Updates made by Lynne Logan, RN since 05/03/2020 12:00 AM    Problem: Disease Progression (Hypertension)   Priority: High    Long-Range Goal: Disease Progression Prevented or Minimized   Start Date: 04/23/2020  Expected End Date: 10/23/2020  Recent Progress: On track  Priority: High  Note:   Objective:  . Last practice recorded BP readings:  BP Readings from Last 3 Encounters:  12/12/19 124/74  12/12/19 124/72  09/27/19  126/64 .   [name] Most recent eGFR/CrCl: No results found for: EGFR  No components found for: CRCL Current Barriers:  [name] Knowledge Deficits related to basic understanding of hypertension pathophysiology and self care management . Knowledge Deficits related to understanding of medications prescribed for management of hypertension Case Manager Clinical Goal(s):  [name] Patient will demonstrate improved adherence to prescribed treatment plan for hypertension as evidenced by taking all medications as prescribed, monitoring and recording blood pressure as directed, adhering to low sodium/DASH diet Interventions:  . Collaboration with Glendale Chard, MD regarding development and update of comprehensive plan of care as evidenced by provider attestation and co-signature . Inter-disciplinary care team collaboration (see longitudinal plan of care) . 04/23/20 completed successful outbound call to patient  . Evaluation of current treatment plan related to hypertension self management and patient's adherence to plan as established by provider. . Provided education to patient re: stroke prevention, s/s of heart attack and stroke, DASH diet, complications of uncontrolled blood pressure . Reviewed medications with patient and discussed importance of compliance . Advised patient, providing education and rationale, to monitor blood pressure daily and record, calling PCP for findings outside established parameters.  . Promote a regular, daily exercise goal of 150 minutes per week of moderate exercise based on tolerance, ability and patient choice; consider referral to physical therapist, community wellness and/or activity program. . Discussed plans with patient for ongoing care management follow up and provided patient with direct contact information for care management team Patient Goals/Self-Care Activities Self administers medications as prescribed Attends all scheduled provider  appointments Calls provider office for new  concerns, questions, or BP outside discussed parameters Checks BP and records as discussed Follows a low sodium diet/DASH diet - check blood pressure 3 times per week - write blood pressure results in a log or diary  Follow Up Plan: Telephone follow up appointment with care management team member scheduled for: 08/25/20   Care Plan : Chronic Kidney (Adult)  Updates made by Lynne Logan, RN since 05/03/2020 12:00 AM    Problem: Disease Progression   Priority: High    Long-Range Goal: Disease Progression Prevented or Minimized   Start Date: 04/23/2020  Expected End Date: 10/23/2020  Recent Progress: On track  Priority: High  Note:   Current Barriers:   Ineffective Self Health Maintenance  Currently UNABLE TO independently self manage needs related to chronic health conditions.   Knowledge Deficits related to short term plan for care coordination needs and long term plans for chronic disease management needs Nurse Case Manager Clinical Goal(s):   Patient will work with care management team to address care coordination and chronic disease management needs related to Disease Management  Educational Needs  Care Coordination  Medication Management and Education   Interventions:  . Collaboration with Glendale Chard, MD regarding development and update of comprehensive plan of care as evidenced by provider attestation and co-signature . Inter-disciplinary care team collaboration (see longitudinal plan of care)  Reviewed and discussed current GFR, Educated on stages of CKD  Determined patient is established with [facility] and has an upcoming appointment with Nephrology  Educated patient on importance of increasing water intake to help improve renal function   Educated on salt substitutes for kidney patients; Educated on importance of increasing water intake to 64 oz per day unless otherwise directed  Discussed potential for disease progression based on clinical diagnosis  and/or causation and other risk factors including race, ethnicity, age, comorbidities, lifestyle.   Encourage lifestyle changes including maintaining a healthy weight, exercise and activity, limiting alcohol consumption, smoking cessation to improve long-term outcomes.  . Discussed plans with patient for ongoing care management follow up and provided patient with direct contact information for care management team Patient Goals/Self Care Activities: - balance periods of rest with activity as needed - exercise at least 2 to 3 times per week - keep all lab appointments - take medicine as prescribed - watch for early signs of feeling worse - call for medicine refill 2 or 3 days before it runs out - keep follow-up appointments  - increase water intake to 64 oz per day unless otherwise directed by kidney doctor   Follow Up Plan: Telephone follow up appointment with care management team member scheduled for: 08/25/20    Plan:Telephone follow up appointment with care management team member scheduled for:  08/25/20  Barb Merino, RN, BSN, CCM Care Management Coordinator [facility]  Direct Phone: [phone]

## 2020-05-05 DIAGNOSIS — I129 Hypertensive chronic kidney disease with stage 1 through stage 4 chronic kidney disease, or unspecified chronic kidney disease: Secondary | ICD-10-CM | POA: Diagnosis not present

## 2020-05-05 DIAGNOSIS — M109 Gout, unspecified: Secondary | ICD-10-CM | POA: Diagnosis not present

## 2020-05-05 DIAGNOSIS — N183 Chronic kidney disease, stage 3 unspecified: Secondary | ICD-10-CM | POA: Diagnosis not present

## 2020-05-05 DIAGNOSIS — E7800 Pure hypercholesterolemia, unspecified: Secondary | ICD-10-CM | POA: Diagnosis not present

## 2020-05-05 DIAGNOSIS — E1122 Type 2 diabetes mellitus with diabetic chronic kidney disease: Secondary | ICD-10-CM | POA: Diagnosis not present

## 2020-07-31 ENCOUNTER — Other Ambulatory Visit: Payer: Self-pay | Admitting: Internal Medicine

## 2020-08-21 ENCOUNTER — Other Ambulatory Visit: Payer: Self-pay | Admitting: Internal Medicine

## 2020-08-21 ENCOUNTER — Ambulatory Visit (INDEPENDENT_AMBULATORY_CARE_PROVIDER_SITE_OTHER): Payer: Medicare Other | Admitting: Internal Medicine

## 2020-08-21 ENCOUNTER — Other Ambulatory Visit: Payer: Self-pay

## 2020-08-21 ENCOUNTER — Encounter: Payer: Self-pay | Admitting: Internal Medicine

## 2020-08-21 VITALS — BP 122/68 | HR 72 | Temp 98.5°F | Ht 61.6 in | Wt 134.0 lb

## 2020-08-21 DIAGNOSIS — N184 Chronic kidney disease, stage 4 (severe): Secondary | ICD-10-CM | POA: Diagnosis not present

## 2020-08-21 DIAGNOSIS — E1122 Type 2 diabetes mellitus with diabetic chronic kidney disease: Secondary | ICD-10-CM

## 2020-08-21 DIAGNOSIS — M1A30X0 Chronic gout due to renal impairment, unspecified site, without tophus (tophi): Secondary | ICD-10-CM

## 2020-08-21 DIAGNOSIS — I129 Hypertensive chronic kidney disease with stage 1 through stage 4 chronic kidney disease, or unspecified chronic kidney disease: Secondary | ICD-10-CM

## 2020-08-21 DIAGNOSIS — E7800 Pure hypercholesterolemia, unspecified: Secondary | ICD-10-CM | POA: Diagnosis not present

## 2020-08-21 DIAGNOSIS — L129 Pemphigoid, unspecified: Secondary | ICD-10-CM | POA: Diagnosis not present

## 2020-08-21 MED ORDER — TELMISARTAN-HCTZ 80-25 MG PO TABS: 1.0000 | ORAL_TABLET | Freq: Every day | ORAL | 2 refills | Status: DC

## 2020-08-21 MED ORDER — ALLOPURINOL 100 MG PO TABS: 100.0000 mg | ORAL_TABLET | Freq: Every day | ORAL | 1 refills | Status: DC

## 2020-08-21 NOTE — Patient Instructions (Signed)

## 2020-08-21 NOTE — Progress Notes (Signed)
I,Tianna Badgett,acting as a Education administrator for Maximino Greenland, MD.,have documented all relevant documentation on the behalf of Maximino Greenland, MD,as directed by  Maximino Greenland, MD while in the presence of Maximino Greenland, MD..  This visit occurred during the SARS-CoV-2 public health emergency.  Safety protocols were in place, including screening questions prior to the visit, additional usage of staff PPE, and extensive cleaning of exam room while observing appropriate contact time as indicated for disinfecting solutions.  Subjective:     Patient ID: Holly Peters , female    DOB: 06-Nov-1950 , 70 y.o.   MRN: [id number]   Chief Complaint  Patient presents with   Diabetes   Hypertension    HPI  She reports for diabetes and blood pressure check today. She reports compliance with meds. She denies headaches, chest pain and shortness of breath.  [place] States her grandson is getting married there.   Diabetes She presents for her follow-up diabetic visit. She has type 2 diabetes mellitus. Her disease course has been stable. There are no hypoglycemic associated symptoms. Pertinent negatives for diabetes include no blurred vision and no chest pain. There are no hypoglycemic complications. Risk factors for coronary artery disease include diabetes mellitus, dyslipidemia, hypertension, sedentary lifestyle and post-menopausal. Her weight is stable. She is following a diabetic diet. Her breakfast blood glucose is taken between 8-9 am. Her breakfast blood glucose range is generally 90-110 mg/dl. Eye exam is current.  Hypertension This is a chronic problem. The current episode started more than 1 year ago. The problem has been gradually improving since onset. The problem is controlled. Pertinent negatives include no blurred vision or chest pain.    Past Medical History:  Diagnosis Date   Diabetes mellitus without complication ([place])    Hyperlipemia    Hypertension       Family History  Problem Relation Age of Onset   Diabetes Mother    Healthy Father      Current Outpatient Medications:    diltiazem (CARDIZEM CD) 240 MG 24 hr capsule, TAKE 1 CAPSULE DAILY, Disp: 90 capsule, Rfl: 3   FREESTYLE LITE test strip, USE TO CHECK BLOOD SUGAR ONCE DAILY, Disp: 100 each, Rfl: 3   ibuprofen (ADVIL,MOTRIN) 400 MG tablet, Take 400 mg by mouth every 6 (six) hours as needed., Disp: , Rfl:    Psyllium (METAMUCIL FIBER PO), Take by mouth. prn, Disp: , Rfl:    simvastatin (ZOCOR) 20 MG tablet, TAKE 1 TABLET DAILY, Disp: 90 tablet, Rfl: 3   TRULICITY 4.13 KG/4.0NU SOPN, INJECT 0.75 MG UNDER THE SKIN ONCE A WEEK, Disp: 6 mL, Rfl: 3   allopurinol (ZYLOPRIM) 100 MG tablet, Take 1 tablet (100 mg total) by mouth daily., Disp: 90 tablet, Rfl: 1   telmisartan-hydrochlorothiazide (MICARDIS HCT) 80-25 MG tablet, Take 1 tablet by mouth daily., Disp: 90 tablet, Rfl: 2   No Known Allergies   Review of Systems  Constitutional: Negative.   Eyes:  Negative for blurred vision.  Respiratory: Negative.    Cardiovascular: Negative.  Negative for chest pain.  Gastrointestinal: Negative.   Neurological: Negative.     Today's Vitals   08/21/20 0954  BP: 122/68  Pulse: 72  Temp: 98.5 F (36.9 C)  TempSrc: Oral  Weight: 134 lb (60.8 kg)  Height: 5' 1.6" (1.565 m)   Body mass index is 24.83 kg/m.  Wt Readings from Last 3 Encounters:  08/21/20 134 lb (60.8 kg)  04/21/20 135 lb (61.2 kg)  12/12/19 136 lb 3.2 oz (61.8 kg)    Objective:  Physical Exam Vitals and nursing note reviewed.  Constitutional:      Appearance: Normal appearance.  HENT:     Head: Normocephalic and atraumatic.     Nose:     Comments: Masked     Mouth/Throat:     Comments: Masked  Eyes:     Extraocular Movements: Extraocular movements intact.  Cardiovascular:     Rate and Rhythm: Normal rate and regular rhythm.     Heart sounds: Normal heart sounds.  Pulmonary:     Effort: Pulmonary effort is  normal.     Breath sounds: Normal breath sounds.  Musculoskeletal:     Cervical back: Normal range of motion.  Skin:    General: Skin is warm.  Neurological:     General: No focal deficit present.     Mental Status: She is alert.  Psychiatric:        Mood and Affect: Mood normal.        Behavior: Behavior normal.        Assessment And Plan:     1. Type 2 diabetes mellitus with stage 4 chronic kidney disease, without long-term current use of insulin (HCC) Comments: Chronic, also followed by Renal. I will check labs as listed below. She is encouraged to stay well hydrated. She will f/u in 4 months for re-evaluation.  - CMP14+EGFR - Hemoglobin A1c - Protein electrophoresis, serum - Parathyroid Hormone, Intact w/Ca - CBC no Diff - Phosphorus  2. Parenchymal renal hypertension, stage 1 through stage 4 or unspecified chronic kidney disease Comments: Chronic, well controlled. Adivsed to follow low sodium diet.  - CMP14+EGFR - CBC no Diff  3. Pure hypercholesterolemia Comments: Chronic, she will c/w simvastatin daily. I will check lipid panel at her next visit. Advised to follow heart healthy lifestyle.   4. 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 She was also given refill of allopurinol daily.  - Uric acid     Patient was given opportunity to ask questions. Patient verbalized understanding of the plan and was able to repeat key elements of the plan. All questions were answered to their satisfaction.   I, Robyn N Sanders, MD, have reviewed all documentation for this visit. The documentation on 08/21/20 for the exam, diagnosis, procedures, and orders are all accurate and complete.  IF YOU HAVE BEEN REFERRED TO A SPECIALIST, IT MAY TAKE 1-2 WEEKS TO SCHEDULE/PROCESS THE REFERRAL. IF YOU HAVE NOT HEARD FROM US/SPECIALIST IN TWO WEEKS, PLEASE GIVE US A CALL AT [phone].   THE PATIENT IS ENCOURAGED  TO PRACTICE SOCIAL DISTANCING DUE TO THE COVID-19 PANDEMIC.    

## 2020-08-22 ENCOUNTER — Encounter: Payer: Self-pay | Admitting: Internal Medicine

## 2020-08-25 ENCOUNTER — Telehealth: Payer: Medicare Other

## 2020-08-25 ENCOUNTER — Encounter: Payer: Self-pay | Admitting: Internal Medicine

## 2020-08-25 LAB — PROTEIN ELECTROPHORESIS, SERUM
A/G Ratio: 1.3 (ref 0.7–1.7)
Albumin ELP: 4.1 g/dL (ref 2.9–4.4)
Alpha 1: 0.2 g/dL (ref 0.0–0.4)
Alpha 2: 0.8 g/dL (ref 0.4–1.0)
Beta: 1 g/dL (ref 0.7–1.3)
Gamma Globulin: 1.2 g/dL (ref 0.4–1.8)
Globulin, Total: 3.2 g/dL (ref 2.2–3.9)

## 2020-08-25 LAB — CMP14+EGFR
ALT: 15 IU/L (ref 0–32)
AST: 16 IU/L (ref 0–40)
Albumin/Globulin Ratio: 2 (ref 1.2–2.2)
Albumin: 4.9 g/dL — ABNORMAL HIGH (ref 3.7–4.7)
Alkaline Phosphatase: 57 IU/L (ref 44–121)
BUN/Creatinine Ratio: 14 (ref 12–28)
BUN: 31 mg/dL — ABNORMAL HIGH (ref 8–27)
Bilirubin Total: 0.3 mg/dL (ref 0.0–1.2)
CO2: 21 mmol/L (ref 20–29)
Calcium: 10 mg/dL (ref 8.7–10.3)
Chloride: 104 mmol/L (ref 96–106)
Creatinine, Ser: 2.24 mg/dL — ABNORMAL HIGH (ref 0.57–1.00)
Globulin, Total: 2.4 g/dL (ref 1.5–4.5)
Glucose: 111 mg/dL — ABNORMAL HIGH (ref 65–99)
Potassium: 4.3 mmol/L (ref 3.5–5.2)
Sodium: 140 mmol/L (ref 134–144)
Total Protein: 7.3 g/dL (ref 6.0–8.5)
eGFR: 23 mL/min/{1.73_m2} — ABNORMAL LOW (ref >59)

## 2020-08-25 LAB — CBC
Hematocrit: 37 % (ref 34.0–46.6)
Hemoglobin: 12.1 g/dL (ref 11.1–15.9)
MCH: 29.4 pg (ref 26.6–33.0)
MCHC: 32.7 g/dL (ref 31.5–35.7)
MCV: 90 fL (ref 79–97)
Platelets: 305 10*3/uL (ref 150–450)
RBC: 4.11 x10E6/uL (ref 3.77–5.28)
RDW: 13.3 % (ref 11.7–15.4)
WBC: 8.2 10*3/uL (ref 3.4–10.8)

## 2020-08-25 LAB — HEMOGLOBIN A1C
Est. average glucose Bld gHb Est-mCnc: 140 mg/dL
Hgb A1c MFr Bld: 6.5 % — ABNORMAL HIGH (ref 4.8–5.6)

## 2020-08-25 LAB — PHOSPHORUS: Phosphorus: 3.7 mg/dL (ref 3.0–4.3)

## 2020-08-25 LAB — PTH, INTACT AND CALCIUM: PTH: 82 pg/mL — ABNORMAL HIGH (ref 15–65)

## 2020-08-25 LAB — URIC ACID: Uric Acid: 5.4 mg/dL (ref 3.1–7.9)

## 2020-08-29 ENCOUNTER — Telehealth: Payer: Medicare Other

## 2020-08-29 ENCOUNTER — Telehealth: Payer: Self-pay

## 2020-08-29 NOTE — Telephone Encounter (Signed)
  Care Management   Follow Up Note   08/29/2020 Name: Holly Peters MRN: [id number] DOB: 12/25/1950   Referred by: Glendale Chard, MD Reason for referral : Chronic Care Management (RN CM Follow up call )   An unsuccessful telephone outreach was attempted today. The patient was referred to the case management team for assistance with care management and care coordination.   Follow Up Plan: Telephone follow up appointment with care management team member scheduled for: 09/22/20  Barb Merino, RN, BSN, CCM Care Management Coordinator [facility]  Direct Phone: [phone]

## 2020-09-10 ENCOUNTER — Emergency Department (HOSPITAL_BASED_OUTPATIENT_CLINIC_OR_DEPARTMENT_OTHER)
Admission: EM | Admit: 2020-09-10 | Discharge: 2020-09-10 | Disposition: A | Discharge status: Home / Self Care | Payer: Medicare Other | Attending: Emergency Medicine | Admitting: Emergency Medicine

## 2020-09-10 ENCOUNTER — Other Ambulatory Visit: Payer: Self-pay

## 2020-09-10 ENCOUNTER — Encounter (HOSPITAL_BASED_OUTPATIENT_CLINIC_OR_DEPARTMENT_OTHER): Payer: Self-pay

## 2020-09-10 DIAGNOSIS — U071 COVID-19: Secondary | ICD-10-CM | POA: Insufficient documentation

## 2020-09-10 DIAGNOSIS — Z794 Long term (current) use of insulin: Secondary | ICD-10-CM | POA: Insufficient documentation

## 2020-09-10 DIAGNOSIS — N183 Chronic kidney disease, stage 3 unspecified: Secondary | ICD-10-CM | POA: Insufficient documentation

## 2020-09-10 DIAGNOSIS — I129 Hypertensive chronic kidney disease with stage 1 through stage 4 chronic kidney disease, or unspecified chronic kidney disease: Secondary | ICD-10-CM | POA: Diagnosis not present

## 2020-09-10 DIAGNOSIS — R059 Cough, unspecified: Secondary | ICD-10-CM | POA: Diagnosis present

## 2020-09-10 DIAGNOSIS — E1122 Type 2 diabetes mellitus with diabetic chronic kidney disease: Secondary | ICD-10-CM | POA: Diagnosis not present

## 2020-09-10 DIAGNOSIS — Z79899 Other long term (current) drug therapy: Secondary | ICD-10-CM | POA: Insufficient documentation

## 2020-09-10 LAB — RESP PANEL BY RT-PCR (FLU A&B, COVID) ARPGX2
Influenza A by PCR: NEGATIVE
Influenza B by PCR: NEGATIVE
SARS Coronavirus 2 by RT PCR: POSITIVE — AB

## 2020-09-10 NOTE — ED Triage Notes (Signed)
Pt c/o flu like sx x 4 days-states she had a home +covid test-NAD-steady gait

## 2020-09-10 NOTE — ED Provider Notes (Signed)
[facility] EMERGENCY DEPARTMENT Provider Note   CSN: [id number] Arrival date & time: 09/10/20  1346     History Chief Complaint  Patient presents with   Covid Positive    Holly Peters is a 70 y.o. female.  HPI Patient presents for cough for the past 4 days.  She denies any headache, myalgias, nausea, vomiting, diarrhea, or difficulty breathing.  She has not had any known fevers.  Her grandson recently tested positive for COVID-19.  She took a home test today which was positive.  Patient presents to the ED due to concerns of COVID infection.  She has a medical history that is notable for DM, HTN, and CKD.      Past Medical History:  Diagnosis Date   Diabetes mellitus without complication ([place])    Hyperlipemia    Hypertension     Patient Active Problem List   Diagnosis Date Noted   Quadriceps tendinitis 08/21/2019   Great toe pain, left 11/03/2018   Type 2 diabetes mellitus with stage 3 chronic kidney disease, without long-term current use of insulin ([place]) 01/31/2018   Hypertensive nephropathy 01/31/2018   Pure hypercholesterolemia 01/31/2018    Past Surgical History:  Procedure Laterality Date   ABDOMINAL HYSTERECTOMY       OB History   No obstetric history on file.     Family History  Problem Relation Age of Onset   Diabetes Mother    Healthy Father     Social History   Tobacco Use   Smoking status: Never   Smokeless tobacco: Never  Vaping Use   Vaping Use: Never used  Substance Use Topics   Alcohol use: No   Drug use: No    Home Medications Prior to Admission medications   Medication Sig Start Date End Date Taking? Authorizing Provider  allopurinol (ZYLOPRIM) 100 MG tablet Take 1 tablet (100 mg total) by mouth daily. 08/21/20   Glendale Chard, MD  diltiazem [facility] CD) 240 MG 24 hr capsule TAKE 1 CAPSULE DAILY 03/24/20   Glendale Chard, MD  FREESTYLE LITE test strip USE TO CHECK BLOOD SUGAR ONCE DAILY 08/01/19   Glendale Chard, MD   ibuprofen (ADVIL,MOTRIN) 400 MG tablet Take 400 mg by mouth every 6 (six) hours as needed.    [provider]  Psyllium (METAMUCIL FIBER PO) Take by mouth. prn    [provider]  simvastatin (ZOCOR) 20 MG tablet TAKE 1 TABLET DAILY 03/24/20   Glendale Chard, MD  telmisartan-hydrochlorothiazide (MICARDIS HCT) 80-25 MG tablet Take 1 tablet by mouth daily. 08/21/20   Glendale Chard, MD  TRULICITY 7.56 EP/3.2RJ SOPN INJECT 0.75 MG UNDER THE SKIN ONCE A WEEK 07/31/20   Glendale Chard, MD    Allergies    Patient has no known allergies.  Review of Systems   Review of Systems  Constitutional:  Negative for activity change, chills, fatigue and fever.  HENT:  Negative for congestion, ear pain, rhinorrhea, sore throat and trouble swallowing.   Eyes:  Negative for pain and visual disturbance.  Respiratory:  Positive for cough. Negative for chest tightness, shortness of breath and wheezing.   Cardiovascular:  Negative for chest pain and palpitations.  Gastrointestinal:  Negative for abdominal pain, diarrhea, nausea and vomiting.  Genitourinary:  Negative for dysuria, flank pain, hematuria and pelvic pain.  Musculoskeletal:  Negative for arthralgias, back pain, gait problem, joint swelling, myalgias and neck pain.  Skin:  Negative for color change and rash.  Neurological:  Negative for dizziness,  seizures, syncope, weakness, light-headedness, numbness and headaches.  All other systems reviewed and are negative.  Physical Exam Updated Vital Signs BP 137/78 (BP Location: Left Arm)   Pulse 73   Temp 98.3 F (36.8 C) (Oral)   Resp 18   Wt 60.3 kg   SpO2 100%   BMI 24.64 kg/m   Physical Exam Vitals and nursing note reviewed.  Constitutional:      General: She is not in acute distress.    Appearance: Normal appearance. She is well-developed and normal weight. She is not ill-appearing, toxic-appearing or diaphoretic.  HENT:     Head: Normocephalic and atraumatic.     Right  Ear: External ear normal.     Left Ear: External ear normal.     Nose: Nose normal.     Mouth/Throat:     Mouth: Mucous membranes are moist.     Pharynx: Oropharynx is clear.  Eyes:     Conjunctiva/sclera: Conjunctivae normal.  Cardiovascular:     Rate and Rhythm: Normal rate and regular rhythm.     Heart sounds: No murmur heard. Pulmonary:     Effort: Pulmonary effort is normal. No respiratory distress.     Breath sounds: Normal breath sounds. No wheezing, rhonchi or rales.  Chest:     Chest wall: No tenderness.  Abdominal:     Palpations: Abdomen is soft.     Tenderness: There is no abdominal tenderness. There is no right CVA tenderness or left CVA tenderness.  Musculoskeletal:        General: Normal range of motion.     Cervical back: Normal range of motion and neck supple. No rigidity or tenderness.  Skin:    General: Skin is warm and dry.     Capillary Refill: Capillary refill takes less than 2 seconds.  Neurological:     General: No focal deficit present.     Mental Status: She is alert and oriented to person, place, and time.     Cranial Nerves: No cranial nerve deficit.     Sensory: No sensory deficit.     Motor: No weakness.     Coordination: Coordination normal.  Psychiatric:        Mood and Affect: Mood normal.        Behavior: Behavior normal.        Thought Content: Thought content normal.        Judgment: Judgment normal.    ED Results / Procedures / Treatments   Labs (all labs ordered are listed, but only abnormal results are displayed) Labs Reviewed  RESP PANEL BY RT-PCR (FLU A&B, COVID) ARPGX2 - Abnormal; Notable for the following components:      Result Value   SARS Coronavirus 2 by RT PCR POSITIVE (*)    All other components within normal limits    EKG None  Radiology No results found.  Procedures Procedures   Medications Ordered in ED Medications - No data to display  ED Course  I have reviewed the triage vital signs and the nursing  notes.  Pertinent labs & imaging results that were available during my care of the patient were reviewed by me and considered in my medical decision making (see chart for details).    MDM Rules/Calculators/A&P                           Patient is a 70 year old female presenting for cough for the past 4 days in the  setting of recent COVID exposure and a recent home COVID test that was positive.  On arrival, she is afebrile with normal vital signs.  She is well-appearing on exam.  She has no increased work of breathing.  Lungs are clear to auscultation.  Abdomen is soft and nontender.  Patient denies any other symptoms besides a cough.  She was offered Tylenol but she declined.  COVID testing was ordered in the ED.  ED testing confirmed COVID-19 infection.  Given the patient's CKD and GFR that is less than 30, Paxlovid is not recommended.  Patient was informed of this. Supportive care at home was advised.  She was encouraged to return to ED if she develops severe symptoms.  She was discharged in good condition.  Final Clinical Impression(s) / ED Diagnoses Final diagnoses:  [id number]    Rx / DC Orders ED Discharge Orders     None        Godfrey Pick, MD 09/11/20 1735

## 2020-09-12 ENCOUNTER — Telehealth: Payer: Self-pay

## 2020-09-12 ENCOUNTER — Other Ambulatory Visit: Payer: Self-pay | Admitting: Internal Medicine

## 2020-09-12 NOTE — Telephone Encounter (Signed)
Spoke with pt, being she was discharged from the ED for being covid positive on 09/10/2020. Pt stated she is doing way better, her [place] her [place] & came back feeling a "head cold". She did an at home test which came back positive she felt the urgent need to go to the ED for further testing. Pt denied appointment offered, she was further advised if any questions, concerns, or needing an appt, feel free to give the office an call.

## 2020-09-14 ENCOUNTER — Other Ambulatory Visit: Payer: Self-pay | Admitting: Internal Medicine

## 2020-09-22 ENCOUNTER — Telehealth: Payer: Self-pay

## 2020-09-22 ENCOUNTER — Telehealth: Payer: Medicare Other

## 2020-09-22 ENCOUNTER — Ambulatory Visit (INDEPENDENT_AMBULATORY_CARE_PROVIDER_SITE_OTHER): Payer: Medicare Other

## 2020-09-22 DIAGNOSIS — N184 Chronic kidney disease, stage 4 (severe): Secondary | ICD-10-CM

## 2020-09-22 DIAGNOSIS — I129 Hypertensive chronic kidney disease with stage 1 through stage 4 chronic kidney disease, or unspecified chronic kidney disease: Secondary | ICD-10-CM

## 2020-09-22 DIAGNOSIS — E1122 Type 2 diabetes mellitus with diabetic chronic kidney disease: Secondary | ICD-10-CM

## 2020-09-22 DIAGNOSIS — E7800 Pure hypercholesterolemia, unspecified: Secondary | ICD-10-CM

## 2020-09-22 DIAGNOSIS — I10 Essential (primary) hypertension: Secondary | ICD-10-CM

## 2020-09-22 NOTE — Telephone Encounter (Signed)
error 

## 2020-09-26 NOTE — Chronic Care Management (AMB) (Signed)
Chronic Care Management   CCM RN Visit Note  09/22/2020 Name: Holly Peters MRN: [id number] DOB: 03/16/1950  Subjective: Holly Peters is a 70 y.o. year old female who is a primary care patient of Glendale Chard, MD. The care management team was consulted for assistance with disease management and care coordination needs.    Engaged with patient by telephone for follow up visit in response to provider referral for case management and/or care coordination services.   Consent to Services:  The patient was given information about Chronic Care Management services, agreed to services, and gave verbal consent prior to initiation of services.  Please see initial visit note for detailed documentation.   Patient agreed to services and verbal consent obtained.   Assessment: Review of patient past medical history, allergies, medications, health status, including review of consultants reports, laboratory and other test data, was performed as part of comprehensive evaluation and provision of chronic care management services.   SDOH (Social Determinants of Health) assessments and interventions performed:    CCM Care Plan  No Known Allergies  Outpatient Encounter Medications as of 09/22/2020  Medication Sig   allopurinol (ZYLOPRIM) 100 MG tablet TAKE 1 TABLET(100 MG) BY MOUTH DAILY   diltiazem (CARDIZEM CD) 240 MG 24 hr capsule TAKE 1 CAPSULE DAILY   FREESTYLE LITE test strip USE TO CHECK BLOOD SUGAR ONCE DAILY   ibuprofen (ADVIL,MOTRIN) 400 MG tablet Take 400 mg by mouth every 6 (six) hours as needed.   Psyllium (METAMUCIL FIBER PO) Take by mouth. prn   simvastatin (ZOCOR) 20 MG tablet TAKE 1 TABLET DAILY   telmisartan-hydrochlorothiazide (MICARDIS HCT) 80-25 MG tablet Take 1 tablet by mouth daily.   TRULICITY 2.50 NL/9.7QB SOPN INJECT 0.75 MG UNDER THE SKIN ONCE A WEEK   No facility-administered encounter medications on file as of 09/22/2020.    Patient Active Problem List    Diagnosis Date Noted   Quadriceps tendinitis 08/21/2019   Great toe pain, left 11/03/2018   Type 2 diabetes mellitus with stage 3 chronic kidney disease, without long-term current use of insulin ([place]) 01/31/2018   Hypertensive nephropathy 01/31/2018   Pure hypercholesterolemia 01/31/2018    Conditions to be addressed/monitored: Type II Diabetes, Parenchymal renal hypertension, stage 1 through 4 or unspecified chronic kidney disease, Essential Hypertension, Pure Hypercholesterolemia  Care Plan : Diabetes Type 2 (Adult)  Updates made by Lynne Logan, RN since 09/22/2020 12:00 AM     Problem: Disease Progression (Diabetes, Type 2)   Priority: High     Long-Range Goal: Disease Progression Prevented or Minimized   Start Date: 04/23/2020  Expected End Date: 04/23/2021  Recent Progress: On track  Priority: High  Note:   Objective:  Lab Results  Component Value Date   HGBA1C 6.5 (H) 08/21/2020   Lab Results  Component Value Date   CREATININE 2.24 (H) 08/21/2020   CREATININE 2.30 (H) 04/21/2020   CREATININE 2.14 (H) 12/12/2019   Lab Results  Component Value Date   EGFR 23 (L) 08/21/2020  Current Barriers:  Knowledge Deficits related to basic Diabetes pathophysiology and self care/management Knowledge Deficits related to medications used for management of diabetes Case Manager Clinical Goal(s):  Patient will demonstrate improved adherence to prescribed treatment plan for diabetes self care/management as evidenced by: follow an ADA diet, implement daily exercise routine and take all DM medications exactly as prescribed.  daily monitoring and recording of CBG  adherence to ADA/ carb modified diet exercise 3-5 days/week adherence to prescribed medication regimen  Interventions:  09/22/20 completed successful outbound call with patient  Collaboration with Glendale Chard, MD regarding development and update of comprehensive plan of care as evidenced by provider attestation and  co-signature Inter-disciplinary care team collaboration (see longitudinal plan of care) Provided education to patient about basic DM disease process Review of patient status, including review of consultants reports, relevant laboratory and other test results, and medications completed. Educated patient on dietary and exercise recommendations; daily glycemic control FBS 80-130, <180 after meals;15'15' rule  Reviewed medications with patient and discussed importance of medication adherence Mailed printed educational materials related to Diabetes management  Discussed plans with patient for ongoing care management follow up and provided patient with direct contact information for care management team Patient Goals/Self-Care Activities Self administers oral medications as prescribed Self administers insulin as prescribed Attends all scheduled provider appointments Checks blood sugars as prescribed and utilize hyper and hypoglycemia protocol as needed Adheres to prescribed ADA/carb modified - check blood sugar before and after exercise - check blood sugar if I feel it is too high or too low - take the blood sugar log to all doctor visits - take the blood sugar meter to all doctor visits   Follow Up Plan: Telephone follow up appointment with care management team member scheduled for: 12/13/20    Care Plan : Hypertension (Adult)  Updates made by Lynne Logan, RN since 09/22/2020 12:00 AM     Problem: Disease Progression (Hypertension)   Priority: High     Long-Range Goal: Disease Progression Prevented or Minimized   Start Date: 04/23/2020  Expected End Date: 04/23/2021  Recent Progress: On track  Priority: High  Note:   Objective:  Last practice recorded BP readings:  BP Readings from Last 3 Encounters:  09/10/20 137/78  08/21/20 122/68  04/21/20 134/80   Most recent eGFR/CrCl:  Lab Results  Component Value Date   EGFR 23 (L) 08/21/2020    No components found for:  CRCL Current Barriers:  Knowledge Deficits related to basic understanding of hypertension pathophysiology and self care management Knowledge Deficits related to understanding of medications prescribed for management of hypertension Case Manager Clinical Goal(s):  Patient will demonstrate improved adherence to prescribed treatment plan for hypertension as evidenced by taking all medications as prescribed, monitoring and recording blood pressure as directed, adhering to low sodium/DASH diet Interventions:  09/22/20 completed successful outbound call with patient  Collaboration with Glendale Chard, MD regarding development and update of comprehensive plan of care as evidenced by provider attestation and co-signature Inter-disciplinary care team collaboration (see longitudinal plan of care) Evaluation of current treatment plan related to hypertension self management and patient's adherence to plan as established by provider. Provided education to patient re: stroke prevention, s/s of heart attack and stroke, DASH diet, complications of uncontrolled blood pressure Reviewed medications with patient and discussed importance of compliance Advised patient, providing education and rationale, to monitor blood pressure daily and record, calling PCP for findings outside established parameters.  Promote a regular, daily exercise goal of 150 minutes per week of moderate exercise based on tolerance, ability and patient choice; consider referral to physical therapist, community wellness and/or activity program. Discussed plans with patient for ongoing care management follow up and provided patient with direct contact information for care management team Patient Goals/Self-Care Activities Self administers medications as prescribed Attends all scheduled provider appointments Calls provider office for new concerns, questions, or BP outside discussed parameters Checks BP and records as discussed Follows a low sodium  diet/DASH diet -  check blood pressure 3 times per week - write blood pressure results in a log or diary  Follow Up Plan: Telephone follow up appointment with care management team member scheduled for: 12/13/20    Care Plan : Chronic Kidney (Adult)  Updates made by Lynne Logan, RN since 09/22/2020 12:00 AM     Problem: Disease Progression   Priority: High     Long-Range Goal: Disease Progression Prevented or Minimized   Start Date: 04/23/2020  Expected End Date: 04/23/2021  Recent Progress: On track  Priority: High  Note:   Current Barriers:  Ineffective Self Health Maintenance Currently UNABLE TO independently self manage needs related to chronic health conditions.  Knowledge Deficits related to short term plan for care coordination needs and long term plans for chronic disease management needs Nurse Case Manager Clinical Goal(s):  Patient will work with care management team to address care coordination and chronic disease management needs related to Disease Management Educational Needs Care Coordination Medication Management and Education   Interventions:  09/22/20 completed successful outbound call with patient  Collaboration with Glendale Chard, MD regarding development and update of comprehensive plan of care as evidenced by provider attestation and co-signature Inter-disciplinary care team collaboration (see longitudinal plan of care) Reviewed and discussed current GFR, Educated on stages of CKD Determined patient is established with [facility] and has an upcoming appointment with Nephrology Review of patient status, including review of consultant's reports, relevant laboratory and other test results, and medications completed. Reviewed medications with patient and discussed importance of medication adherence Educated patient on importance of increasing water intake to help improve renal function  Educated on salt substitutes for kidney patients; Educated on  importance of increasing water intake to 64 oz per day unless otherwise directed Mailed printed educational materials related to Eating Right with Kidney disease  Discussed plans with patient for ongoing care management follow up and provided patient with direct contact information for care management team Patient Goals/Self Care Activities: - balance periods of rest with activity as needed - exercise at least 2 to 3 times per week - keep all lab appointments - take medicine as prescribed - watch for early signs of feeling worse - call for medicine refill 2 or 3 days before it runs out - keep follow-up appointments  - increase water intake to 64 oz per day unless otherwise directed by kidney doctor   Follow Up Plan: Telephone follow up appointment with care management team member scheduled for: 12/13/20     Plan:Telephone follow up appointment with care management team member scheduled for:  12/13/20  Barb Merino, RN, BSN, CCM Care Management Coordinator [facility]  Direct Phone: [phone]

## 2020-09-26 NOTE — Patient Instructions (Signed)
Visit Information  PATIENT GOALS:  Goals Addressed      Follow My Treatment Plan-Chronic Kidney   On track    Timeframe:  Long-Range Goal Priority:  High Start Date: 04/23/20                           Expected End Date: 04/23/21                     Follow Up Date: 12/13/20   - call for medicine refill 2 or 3 days before it runs out - keep follow-up appointments  - increase water intake to 64 oz per day unless otherwise directed by kidney doctor    Why is this important?   Staying as healthy as you can is very important. This may mean making changes if you smoke, don't exercise or eat poorly.  A healthy lifestyle is an important goal for you.  Following the treatment plan and making changes may be hard.  Try some of these steps to help keep the disease from getting worse.     Notes:      Monitor and Manage My Blood Sugar-Diabetes Type 2   On track    Timeframe:  Long-Range Goal Priority:  High Start Date:  04/23/20                          Expected End Date: 04/23/21                    Follow Up Date: 12/13/20  - check blood sugar before and after exercise - check blood sugar if I feel it is too high or too low - take the blood sugar log to all doctor visits - take the blood sugar meter to all doctor visits    Why is this important?   Checking your blood sugar at home helps to keep it from getting very high or very low.  Writing the results in a diary or log helps the doctor know how to care for you.  Your blood sugar log should have the time, date and the results.  Also, write down the amount of insulin or other medicine that you take.  Other information, like what you ate, exercise done and how you were feeling, will also be helpful.     Notes:      Track and Manage My Blood Pressure-Hypertension   On track    Timeframe:  Long-Range Goal Priority:  High Start Date:  04/23/20                          Expected End Date:  04/23/21                     Follow Up Date:  12/13/20   - check blood pressure 3 times per week - write blood pressure results in a log or diary  Self administers medications as prescribed Attends all scheduled provider appointments Calls provider office for new concerns, questions, or BP outside discussed parameters Checks BP and records as discussed Follows a low sodium diet/DASH diet   Why is this important?   You won't feel high blood pressure, but it can still hurt your blood vessels.  High blood pressure can cause heart or kidney problems. It can also cause a stroke.  Making lifestyle changes like losing a Ryah Cribb  weight or eating less salt will help.  Checking your blood pressure at home and at different times of the day can help to control blood pressure.  If the doctor prescribes medicine remember to take it the way the doctor ordered.  Call the office if you cannot afford the medicine or if there are questions about it.     Notes:      Track and Manage My Symptoms-Chronic Kidney   On track    Timeframe:  Long-Range Goal Priority:  High Start Date: 04/23/20                          Expected End Date: 04/23/21                      Follow Up Date: 12/13/20   - balance periods of rest with activity as needed - exercise at least 2 to 3 times per week - keep all lab appointments - take medicine as prescribed - watch for early signs of feeling worse    Why is this important?   Keeping track of symptoms can help you feel the best. It also helps the doctor stay on top of any changes to the disease. It may also help keep your disease from getting worse.  Taking simple steps can help you cope with symptoms like feeling very tired or itchy skin.     Notes:         The patient verbalized understanding of instructions, educational materials, and care plan provided today and declined offer to receive copy of patient instructions, educational materials, and care plan.   Telephone follow up appointment with care management  team member scheduled for: 12/13/20  Barb Merino, RN, BSN, CCM Care Management Coordinator [facility]  Direct Phone: [phone]

## 2020-10-03 DIAGNOSIS — N184 Chronic kidney disease, stage 4 (severe): Secondary | ICD-10-CM | POA: Diagnosis not present

## 2020-10-03 DIAGNOSIS — I10 Essential (primary) hypertension: Secondary | ICD-10-CM | POA: Diagnosis not present

## 2020-10-03 DIAGNOSIS — E7800 Pure hypercholesterolemia, unspecified: Secondary | ICD-10-CM

## 2020-10-03 DIAGNOSIS — E1122 Type 2 diabetes mellitus with diabetic chronic kidney disease: Secondary | ICD-10-CM

## 2020-10-03 DIAGNOSIS — I129 Hypertensive chronic kidney disease with stage 1 through stage 4 chronic kidney disease, or unspecified chronic kidney disease: Secondary | ICD-10-CM

## 2020-12-03 DIAGNOSIS — N183 Chronic kidney disease, stage 3 unspecified: Secondary | ICD-10-CM | POA: Diagnosis not present

## 2020-12-03 DIAGNOSIS — E7800 Pure hypercholesterolemia, unspecified: Secondary | ICD-10-CM | POA: Diagnosis not present

## 2020-12-03 DIAGNOSIS — I129 Hypertensive chronic kidney disease with stage 1 through stage 4 chronic kidney disease, or unspecified chronic kidney disease: Secondary | ICD-10-CM | POA: Diagnosis not present

## 2020-12-03 DIAGNOSIS — M109 Gout, unspecified: Secondary | ICD-10-CM | POA: Diagnosis not present

## 2020-12-03 DIAGNOSIS — E1122 Type 2 diabetes mellitus with diabetic chronic kidney disease: Secondary | ICD-10-CM | POA: Diagnosis not present

## 2020-12-11 ENCOUNTER — Ambulatory Visit (INDEPENDENT_AMBULATORY_CARE_PROVIDER_SITE_OTHER): Payer: Medicare Other

## 2020-12-11 ENCOUNTER — Encounter: Payer: Self-pay | Admitting: Internal Medicine

## 2020-12-11 ENCOUNTER — Other Ambulatory Visit: Payer: Self-pay

## 2020-12-11 ENCOUNTER — Ambulatory Visit (INDEPENDENT_AMBULATORY_CARE_PROVIDER_SITE_OTHER): Payer: Medicare Other | Admitting: Internal Medicine

## 2020-12-11 VITALS — BP 120/60 | HR 94 | Temp 98.6°F | Ht 61.2 in | Wt 133.2 lb

## 2020-12-11 DIAGNOSIS — I129 Hypertensive chronic kidney disease with stage 1 through stage 4 chronic kidney disease, or unspecified chronic kidney disease: Secondary | ICD-10-CM

## 2020-12-11 DIAGNOSIS — Z23 Encounter for immunization: Secondary | ICD-10-CM

## 2020-12-11 DIAGNOSIS — E1122 Type 2 diabetes mellitus with diabetic chronic kidney disease: Secondary | ICD-10-CM | POA: Diagnosis not present

## 2020-12-11 DIAGNOSIS — N184 Chronic kidney disease, stage 4 (severe): Secondary | ICD-10-CM

## 2020-12-11 DIAGNOSIS — Z0000 Encounter for general adult medical examination without abnormal findings: Secondary | ICD-10-CM

## 2020-12-11 LAB — POCT URINALYSIS DIPSTICK
Bilirubin, UA: NEGATIVE
Blood, UA: NEGATIVE
Glucose, UA: NEGATIVE
Ketones, UA: NEGATIVE
Nitrite, UA: NEGATIVE
Protein, UA: NEGATIVE
Spec Grav, UA: 1.025 (ref 1.010–1.025)
Urobilinogen, UA: 0.2 E.U./dL
pH, UA: 5.5 (ref 5.0–8.0)

## 2020-12-11 LAB — POCT UA - MICROALBUMIN
Creatinine, POC: 300 mg/dL
Microalbumin Ur, POC: 80 mg/L

## 2020-12-11 MED ORDER — BOOSTRIX 5-2.5-18.5 LF-MCG/0.5 IM SUSP: 0.5000 mL | Freq: Once | INTRAMUSCULAR | 0 refills | Status: AC

## 2020-12-11 NOTE — Patient Instructions (Signed)

## 2020-12-11 NOTE — Progress Notes (Signed)
I,Katawbba Wiggins,acting as a Education administrator for Maximino Greenland, MD.,have documented all relevant documentation on the behalf of Maximino Greenland, MD,as directed by  Maximino Greenland, MD while in the presence of Maximino Greenland, MD.  This visit occurred during the SARS-CoV-2 public health emergency.  Safety protocols were in place, including screening questions prior to the visit, additional usage of staff PPE, and extensive cleaning of exam room while observing appropriate contact time as indicated for disinfecting solutions.  Subjective:     Patient ID: Holly Peters , female    DOB: 10-Jun-1950 , 70 y.o.   MRN: [id number]   Chief Complaint  Patient presents with   Diabetes   Hypertension    HPI  She reports for diabetes and blood pressure check today. She reports compliance with meds. She denies headaches, chest pain and shortness of breath.   She is also scheduled for AWV with [facility] Advisor today.   Diabetes She presents for her follow-up diabetic visit. She has type 2 diabetes mellitus. Her disease course has been stable. There are no hypoglycemic associated symptoms. Pertinent negatives for diabetes include no blurred vision and no chest pain. There are no hypoglycemic complications. Risk factors for coronary artery disease include diabetes mellitus, dyslipidemia, hypertension, sedentary lifestyle and post-menopausal. Her weight is stable. She is following a diabetic diet. Her breakfast blood glucose is taken between 8-9 am. Her breakfast blood glucose range is generally 90-110 mg/dl. Eye exam is current.  Hypertension This is a chronic problem. The current episode started more than 1 year ago. The problem has been gradually improving since onset. The problem is controlled. Pertinent negatives include no blurred vision or chest pain.    Past Medical History:  Diagnosis Date   Diabetes mellitus without complication (HCC)    Hyperlipemia    Hypertension      Family History  Problem  Relation Age of Onset   Diabetes Mother    Healthy Father      Current Outpatient Medications:    allopurinol (ZYLOPRIM) 100 MG tablet, TAKE 1 TABLET(100 MG) BY MOUTH DAILY, Disp: 90 tablet, Rfl: 1   diltiazem (CARDIZEM CD) 240 MG 24 hr capsule, TAKE 1 CAPSULE DAILY, Disp: 90 capsule, Rfl: 3   FREESTYLE LITE test strip, USE TO CHECK BLOOD SUGAR ONCE DAILY, Disp: 100 strip, Rfl: 3   ibuprofen (ADVIL,MOTRIN) 400 MG tablet, Take 400 mg by mouth every 6 (six) hours as needed., Disp: , Rfl:    MICARDIS HCT 40-12.5 MG tablet, Take 1 tablet by mouth daily., Disp: , Rfl:    Psyllium (METAMUCIL FIBER PO), Take by mouth. prn, Disp: , Rfl:    simvastatin (ZOCOR) 20 MG tablet, TAKE 1 TABLET DAILY, Disp: 90 tablet, Rfl: 3   Tdap (BOOSTRIX) 5-2.5-18.5 LF-MCG/0.5 injection, Inject 0.5 mLs into the muscle once for 1 dose., Disp: 0.5 mL, Rfl: 0   TRULICITY 7.16 RC/7.8LF SOPN, INJECT 0.75 MG UNDER THE SKIN ONCE A WEEK, Disp: 6 mL, Rfl: 3   No Known Allergies   Review of Systems  Constitutional: Negative.   Eyes:  Negative for blurred vision.  Respiratory: Negative.    Cardiovascular: Negative.  Negative for chest pain.  Gastrointestinal: Negative.   Neurological: Negative.   Psychiatric/Behavioral: Negative.      Today's Vitals   12/11/20 0900  BP: 120/60  Pulse: 94  Temp: 98.6 F (37 C)  Weight: 133 lb 3.2 oz (60.4 kg)  Height: 5' 1.2" (1.554 m)   Body mass index  is 25 kg/m.  Wt Readings from Last 3 Encounters:  12/11/20 133 lb 3.2 oz (60.4 kg)  12/11/20 133 lb 3.2 oz (60.4 kg)  09/10/20 133 lb (60.3 kg)    BP Readings from Last 3 Encounters:  12/11/20 120/60  12/11/20 120/60  09/10/20 137/78    Objective:  Physical Exam Vitals and nursing note reviewed.  Constitutional:      Appearance: Normal appearance.  HENT:     Head: Normocephalic and atraumatic.     Nose:     Comments: Masked     Mouth/Throat:     Comments: Masked  Eyes:     Extraocular Movements: Extraocular  movements intact.  Cardiovascular:     Rate and Rhythm: Normal rate and regular rhythm.     Heart sounds: Normal heart sounds.  Pulmonary:     Effort: Pulmonary effort is normal.     Breath sounds: Normal breath sounds.  Musculoskeletal:     Cervical back: Normal range of motion.  Skin:    General: Skin is warm.  Neurological:     General: No focal deficit present.     Mental Status: She is alert.  Psychiatric:        Mood and Affect: Mood normal.        Behavior: Behavior normal.        Assessment And Plan:     1. Type 2 diabetes mellitus with stage 4 chronic kidney disease, without long-term current use of insulin (HCC) Comments: Chronic, I will check labs as below. I will adjust meds as needed. She will rto in 4 months for re-evaluation.  - Hemoglobin A1c  2. Parenchymal renal hypertension, stage 1 through stage 4 or unspecified chronic kidney disease Comments: Chronic, well controlled. Encouraged to follow low sodium diet. NO med changes today; however, I will update that Renal has decreased telmisartan/hct. - CMP14+EGFR  3. Immunization due Comments: [place] sent Boostrix to her pharmacy. I will contact her local pharmacy to get administration dates for flu/COVID booster.    Patient was given opportunity to ask questions. Patient verbalized understanding of the plan and was able to repeat key elements of the plan. All questions were answered to their satisfaction.   I, Maximino Greenland, MD, have reviewed all documentation for this visit. The documentation on 12/11/20 for the exam, diagnosis, procedures, and orders are all accurate and complete.   IF YOU HAVE BEEN REFERRED TO A SPECIALIST, IT MAY TAKE 1-2 WEEKS TO SCHEDULE/PROCESS THE REFERRAL. IF YOU HAVE NOT HEARD FROM US/SPECIALIST IN TWO WEEKS, PLEASE GIVE [place] A CALL AT [phone].   THE PATIENT IS ENCOURAGED TO PRACTICE SOCIAL DISTANCING DUE TO THE COVID-19 PANDEMIC.

## 2020-12-11 NOTE — Progress Notes (Signed)
This visit occurred during the SARS-CoV-2 public health emergency.  Safety protocols were in place, including screening questions prior to the visit, additional usage of staff PPE, and extensive cleaning of exam room while observing appropriate contact time as indicated for disinfecting solutions.  Subjective:   Holly Peters is a 70 y.o. female who presents for Medicare Annual (Subsequent) preventive examination.  Review of Systems     Cardiac Risk Factors include: advanced age (>61men, >1 women);diabetes mellitus;hypertension     Objective:    Today's Vitals   12/11/20 0837  BP: 120/60  Pulse: 94  Temp: 98.6 F (37 C)  TempSrc: Oral  SpO2: 98%  Weight: 133 lb 3.2 oz (60.4 kg)  Height: 5' 1.2" (1.554 m)   Body mass index is 25 kg/m.  Advanced Directives 12/11/2020 09/10/2020 12/12/2019 10/03/2018 11/14/2017 11/11/2017 03/13/2015  Does Patient Have a Medical Advance Directive? Yes Yes Yes Yes No No No  Type of Paramedic of [place];Living will - Living will [place];Living will - - -  Copy of [place] in Chart? No - copy requested - - No - copy requested - - -    Current Medications (verified) Outpatient Encounter Medications as of 12/11/2020  Medication Sig   allopurinol (ZYLOPRIM) 100 MG tablet TAKE 1 TABLET(100 MG) BY MOUTH DAILY   diltiazem (CARDIZEM CD) 240 MG 24 hr capsule TAKE 1 CAPSULE DAILY   FREESTYLE LITE test strip USE TO CHECK BLOOD SUGAR ONCE DAILY   ibuprofen (ADVIL,MOTRIN) 400 MG tablet Take 400 mg by mouth every 6 (six) hours as needed.   MICARDIS HCT 40-12.5 MG tablet Take 1 tablet by mouth daily.   Psyllium (METAMUCIL FIBER PO) Take by mouth. prn   simvastatin (ZOCOR) 20 MG tablet TAKE 1 TABLET DAILY   TRULICITY 6.38 GY/6.5LD SOPN INJECT 0.75 MG UNDER THE SKIN ONCE A WEEK   telmisartan-hydrochlorothiazide (MICARDIS HCT) 80-25 MG tablet Take 1 tablet by mouth daily.   No  facility-administered encounter medications on file as of 12/11/2020.    Allergies (verified) Patient has no known allergies.   History: Past Medical History:  Diagnosis Date   Diabetes mellitus without complication ([place])    Hyperlipemia    Hypertension    Past Surgical History:  Procedure Laterality Date   ABDOMINAL HYSTERECTOMY     Family History  Problem Relation Age of Onset   Diabetes Mother    Healthy Father    Social History   Socioeconomic History   Marital status: Married    Spouse name: Not on file   Number of children: Not on file   Years of education: Not on file   Highest education level: Not on file  Occupational History   Occupation: retired  Tobacco Use   Smoking status: Never   Smokeless tobacco: Never  Vaping Use   Vaping Use: Never used  Substance and Sexual Activity   Alcohol use: No   Drug use: No   Sexual activity: Yes    Birth control/protection: Surgical  Other Topics Concern   Not on file  Social History Narrative   Not on file   Social Determinants of Health   Financial Resource Strain: Low Risk    Difficulty of Paying Living Expenses: Not hard at all  Food Insecurity: No Food Insecurity   Worried About Charity fundraiser in the Last Year: Never true   [place] in the Last Year: Never true  Transportation Needs:  No Transportation Needs   Lack of Transportation (Medical): No   Lack of Transportation (Non-Medical): No  Physical Activity: Sufficiently Active   Days of Exercise per Week: 3 days   Minutes of Exercise per Session: 60 min  Stress: No Stress Concern Present   Feeling of Stress : Not at all  Social Connections: Not on file    Tobacco Counseling Counseling given: Not Answered   Clinical Intake:  Pre-visit preparation completed: Yes  Pain : No/denies pain     Nutritional Status: BMI of 19-24  Normal Nutritional Risks: None Diabetes: Yes  How often do you need to have someone help you when you read  instructions, pamphlets, or other written materials from your doctor or pharmacy?: 1 - Never What is the last grade level you completed in school?: some college  Diabetic? Yes Nutrition Risk Assessment:  Has the patient had any N/V/D within the last 2 months?  No  Does the patient have any non-healing wounds?  No  Has the patient had any unintentional weight loss or weight gain?  No   Diabetes:  Is the patient diabetic?  Yes  If diabetic, was a CBG obtained today?  No  Did the patient bring in their glucometer from home?  No  How often do you monitor your CBG's? daily.   Financial Strains and Diabetes Management:  Are you having any financial strains with the device, your supplies or your medication? No .  Does the patient want to be seen by Chronic Care Management for management of their diabetes?  No  Would the patient like to be referred to a Nutritionist or for Diabetic Management?  No   Diabetic Exams:  Diabetic Eye Exam: Completed 01/15/2020 Diabetic Foot Exam: Completed 04/21/2020   Interpreter Needed?: No  Information entered by :: NAllen LPN   Activities of Daily Living In your present state of health, do you have any difficulty performing the following activities: 12/11/2020 12/12/2019  Hearing? N N  Vision? N N  Difficulty concentrating or making decisions? N N  Walking or climbing stairs? N N  Dressing or bathing? N N  Doing errands, shopping? N N  Preparing Food and eating ? N N  Using the Toilet? N N  In the past six months, have you accidently leaked urine? N N  Do you have problems with loss of bowel control? N N  Managing your Medications? N N  Managing your Finances? N N  Housekeeping or managing your Housekeeping? N N  Some recent data might be hidden    Patient Care Team: Glendale Chard, MD as PCP - General (Internal Medicine) Rex Kras, Claudette Stapler, RN as [place] any recent [place] you may have  received from other than [facility] providers in the past year (date may be approximate).     Assessment:   This is a routine wellness examination for Holly Peters.  Hearing/Vision screen Vision Screening - Comments:: Regular eye exams, Dr. Katy Fitch  Dietary issues and exercise activities discussed: Current Exercise Habits: Structured exercise class, Type of exercise: Other - see comments (dancing), Time (Minutes): 60, Frequency (Times/Week): 3, Weekly Exercise (Minutes/Week): 180   Goals Addressed             This Visit's Progress    Patient Stated       12/11/2020, stay healthy       Depression Screen [facility] 2/9 Scores 12/11/2020 12/12/2019 11/21/2018 10/11/2018 10/03/2018 05/30/2018 01/31/2018  PHQ - 2 Score  0 0 0 0 0 0 0  PHQ- 9 Score - - - - 3 - -    Fall Risk Fall Risk  12/11/2020 12/12/2019 11/21/2018 10/11/2018 10/03/2018  Falls in the past year? 0 0 0 0 0  Comment - - - - -  Number falls in past yr: - - - - 0  Comment - - - - -  Injury with Fall? - - - - -  Risk for fall due to : Medication side effect Medication side effect - - Medication side effect  Follow up Falls evaluation completed;Education provided;Falls prevention discussed Falls evaluation completed;Education provided;Falls prevention discussed - - Falls evaluation completed;Falls prevention discussed    FALL RISK PREVENTION PERTAINING TO THE HOME:  Any stairs in or around the home? No  If so, are there any without handrails? N/a Home free of loose throw rugs in walkways, pet beds, electrical cords, etc? Yes  Adequate lighting in your home to reduce risk of falls? Yes   ASSISTIVE DEVICES UTILIZED TO PREVENT FALLS:  Life alert? No  Use of a cane, walker or w/c? No  Grab bars in the bathroom? Yes  Shower chair or bench in shower? Yes  Elevated toilet seat or a handicapped toilet? Yes   TIMED UP AND GO:  Was the test performed? No .    Gait steady and fast without use of assistive device  Cognitive Function:      6CIT Screen 12/11/2020 12/12/2019 10/03/2018  What Year? 0 points 0 points 0 points  What month? 0 points 0 points 0 points  What time? 0 points 0 points 0 points  Count back from 20 0 points 0 points 0 points  Months in reverse 0 points 0 points 0 points  Repeat phrase 2 points 2 points 0 points  Total Score 2 2 0    Immunizations Immunization History  Administered Date(s) Administered   Fluad Quad(high Dose 65+) 09/29/2018, 09/27/2019   Influenza-Unspecified 09/29/2018   PFIZER(Purple Top)SARS-COV-2 Vaccination 02/04/2019, 02/25/2019, 10/12/2019, 04/30/2020   Pfizer Covid-19 Vaccine Bivalent Booster 5y-11y 10/14/2020   Pneumococcal Conjugate-13 10/03/2017   Zoster Recombinat (Shingrix) 02/20/2018, 07/17/2018    TDAP status: Due, Education has been provided regarding the importance of this vaccine. Advised may receive this vaccine at local pharmacy or Health Dept. Aware to provide a copy of the vaccination record if obtained from local pharmacy or Health Dept. Verbalized acceptance and understanding.  Flu Vaccine status: Up to date  Pneumococcal vaccine status: Completed during today's visit.  Covid-19 vaccine status: Completed vaccines  Qualifies for Shingles Vaccine? Yes   Zostavax completed No   Shingrix Completed?: Yes  Screening Tests Health Maintenance  Topic Date Due   Pneumonia Vaccine 33+ Years old (2 - PPSV23 if available, else PCV20) 10/04/2018   INFLUENZA VACCINE  08/04/2020   TETANUS/TDAP  11/11/2020   OPHTHALMOLOGY EXAM  01/14/2021   MAMMOGRAM  01/27/2021   HEMOGLOBIN A1C  02/21/2021   FOOT EXAM  04/21/2021   COLONOSCOPY (Pts 45-18yrs Insurance coverage will need to be confirmed)  03/24/2027   DEXA SCAN  Completed   COVID-19 Vaccine  Completed   Hepatitis C Screening  Completed   Zoster Vaccines- Shingrix  Completed   HPV VACCINES  Aged Out    Health Maintenance  Health Maintenance Due  Topic Date Due   Pneumonia Vaccine 80+ Years old (2 - PPSV23  if available, else PCV20) 10/04/2018   INFLUENZA VACCINE  08/04/2020   TETANUS/TDAP  11/11/2020  Colorectal cancer screening: Type of screening: Colonoscopy. Completed 03/23/2017. Repeat every 10 years  Mammogram status: Completed 01/28/2020. Repeat every year  Bone Density status: Completed 02/14/2019. Results reflect: Bone density results: OSTEOPENIA. Repeat every 2 years.  Lung Cancer Screening: (Low Dose CT Chest recommended if Age 73-80 years, 30 pack-year currently smoking OR have quit w/in 15years.) does not qualify.   Lung Cancer Screening Referral: no  Additional Screening:  Hepatitis C Screening: does qualify; Completed 12/08/2012  Vision Screening: Recommended annual ophthalmology exams for early detection of glaucoma and other disorders of the eye. Is the patient up to date with their annual eye exam?  Yes  Who is the provider or what is the name of the office in which the patient attends annual eye exams? Dr. Katy Fitch If pt is not established with a provider, would they like to be referred to a provider to establish care? No .   Dental Screening: Recommended annual dental exams for proper oral hygiene  Community Resource Referral / Chronic Care Management: CRR required this visit?  No   CCM required this visit?  No      Plan:     I have personally reviewed and noted the following in the patient's chart:   Medical and social history Use of alcohol, tobacco or illicit drugs  Current medications and supplements including opioid prescriptions.  Functional ability and status Nutritional status Physical activity Advanced directives List of other physicians Hospitalizations, surgeries, and ER visits in previous 12 months Vitals Screenings to include cognitive, depression, and falls Referrals and appointments  In addition, I have reviewed and discussed with patient certain preventive protocols, quality metrics, and best practice recommendations. A written  personalized care plan for preventive services as well as general preventive health recommendations were provided to patient.     Kellie Simmering, LPN   17/04/812   Nurse Notes: none

## 2020-12-11 NOTE — Patient Instructions (Signed)
Holly Peters , Thank you for taking time to come for your Medicare Wellness Visit. I appreciate your ongoing commitment to your health goals. Please review the following plan we discussed and let me know if I can assist you in the future.   Screening recommendations/referrals: Colonoscopy: completed 03/23/2017 Mammogram: completed 01/28/2020 Bone Density: completed 02/14/2019 Recommended yearly ophthalmology/optometry visit for glaucoma screening and checkup Recommended yearly dental visit for hygiene and checkup  Vaccinations: Influenza vaccine: completed 09/29/2020 Pneumococcal vaccine: due Tdap vaccine: due Shingles vaccine: completed   Covid-19: 10/14/2020, 04/30/2020, 10/12/2019, 02/25/2019, 02/04/2019  Advanced directives: Please bring a copy of your POA (Power of Attorney) and/or Living Will to your next appointment.   Conditions/risks identified: none  Next appointment: Follow up in one year for your annual wellness visit    Preventive Care 65 Years and Older, Female Preventive care refers to lifestyle choices and visits with your health care provider that can promote health and wellness. What does preventive care include? A yearly physical exam. This is also called an annual well check. Dental exams once or twice a year. Routine eye exams. Ask your health care provider how often you should have your eyes checked. Personal lifestyle choices, including: Daily care of your teeth and gums. Regular physical activity. Eating a healthy diet. Avoiding tobacco and drug use. Limiting alcohol use. Practicing safe sex. Taking low-dose aspirin every day. Taking vitamin and mineral supplements as recommended by your health care provider. What happens during an annual well check? The services and screenings done by your health care provider during your annual well check will depend on your age, overall health, lifestyle risk factors, and family history of disease. Counseling  Your health  care provider may ask you questions about your: Alcohol use. Tobacco use. Drug use. Emotional well-being. Home and relationship well-being. Sexual activity. Eating habits. History of falls. Memory and ability to understand (cognition). Work and work Statistician. Reproductive health. Screening  You may have the following tests or measurements: Height, weight, and BMI. Blood pressure. Lipid and cholesterol levels. These may be checked every 5 years, or more frequently if you are over 6 years old. Skin check. Lung cancer screening. You may have this screening every year starting at age 60 if you have a 30-pack-year history of smoking and currently smoke or have quit within the past 15 years. Fecal occult blood test (FOBT) of the stool. You may have this test every year starting at age 44. Flexible sigmoidoscopy or colonoscopy. You may have a sigmoidoscopy every 5 years or a colonoscopy every 10 years starting at age 35. Hepatitis C blood test. Hepatitis B blood test. Sexually transmitted disease (STD) testing. Diabetes screening. This is done by checking your blood sugar (glucose) after you have not eaten for a while (fasting). You may have this done every 1-3 years. Bone density scan. This is done to screen for osteoporosis. You may have this done starting at age 31. Mammogram. This may be done every 1-2 years. Talk to your health care provider about how often you should have regular mammograms. Talk with your health care provider about your test results, treatment options, and if necessary, the need for more tests. Vaccines  Your health care provider may recommend certain vaccines, such as: Influenza vaccine. This is recommended every year. Tetanus, diphtheria, and acellular pertussis (Tdap, Td) vaccine. You may need a Td booster every 10 years. Zoster vaccine. You may need this after age 28. Pneumococcal 13-valent conjugate (PCV13) vaccine. One dose is recommended  after age  61. Pneumococcal polysaccharide (PPSV23) vaccine. One dose is recommended after age 62. Talk to your health care provider about which screenings and vaccines you need and how often you need them. This information is not intended to replace advice given to you by your health care provider. Make sure you discuss any questions you have with your health care provider. Document Released: 01/17/2015 Document Revised: 09/10/2015 Document Reviewed: 10/22/2014 Elsevier Interactive Patient Education  2017 [place] Prevention in the Home Falls can cause injuries. They can happen to people of all ages. There are many things you can do to make your home safe and to help prevent falls. What can I do on the outside of my home? Regularly fix the edges of walkways and driveways and fix any cracks. Remove anything that might make you trip as you walk through a door, such as a raised step or threshold. Trim any bushes or trees on the path to your home. Use bright outdoor lighting. Clear any walking paths of anything that might make someone trip, such as rocks or tools. Regularly check to see if handrails are loose or broken. Make sure that both sides of any steps have handrails. Any raised decks and porches should have guardrails on the edges. Have any leaves, snow, or ice cleared regularly. Use sand or salt on walking paths during winter. Clean up any spills in your garage right away. This includes oil or grease spills. What can I do in the bathroom? Use night lights. Install grab bars by the toilet and in the tub and shower. Do not use towel bars as grab bars. Use non-skid mats or decals in the tub or shower. If you need to sit down in the shower, use a plastic, non-slip stool. Keep the floor dry. Clean up any water that spills on the floor as soon as it happens. Remove soap buildup in the tub or shower regularly. Attach bath mats securely with double-sided non-slip rug tape. Do not have throw  rugs and other things on the floor that can make you trip. What can I do in the bedroom? Use night lights. Make sure that you have a light by your bed that is easy to reach. Do not use any sheets or blankets that are too big for your bed. They should not hang down onto the floor. Have a firm chair that has side arms. You can use this for support while you get dressed. Do not have throw rugs and other things on the floor that can make you trip. What can I do in the kitchen? Clean up any spills right away. Avoid walking on wet floors. Keep items that you use a lot in easy-to-reach places. If you need to reach something above you, use a strong step stool that has a grab bar. Keep electrical cords out of the way. Do not use floor polish or wax that makes floors slippery. If you must use wax, use non-skid floor wax. Do not have throw rugs and other things on the floor that can make you trip. What can I do with my stairs? Do not leave any items on the stairs. Make sure that there are handrails on both sides of the stairs and use them. Fix handrails that are broken or loose. Make sure that handrails are as long as the stairways. Check any carpeting to make sure that it is firmly attached to the stairs. Fix any carpet that is loose or worn. Avoid having throw  rugs at the top or bottom of the stairs. If you do have throw rugs, attach them to the floor with carpet tape. Make sure that you have a light switch at the top of the stairs and the bottom of the stairs. If you do not have them, ask someone to add them for you. What else can I do to help prevent falls? Wear shoes that: Do not have high heels. Have rubber bottoms. Are comfortable and fit you well. Are closed at the toe. Do not wear sandals. If you use a stepladder: Make sure that it is fully opened. Do not climb a closed stepladder. Make sure that both sides of the stepladder are locked into place. Ask someone to hold it for you, if  possible. Clearly mark and make sure that you can see: Any grab bars or handrails. First and last steps. Where the edge of each step is. Use tools that help you move around (mobility aids) if they are needed. These include: Canes. Walkers. Scooters. Crutches. Turn on the lights when you go into a dark area. Replace any light bulbs as soon as they burn out. Set up your furniture so you have a clear path. Avoid moving your furniture around. If any of your floors are uneven, fix them. If there are any pets around you, be aware of where they are. Review your medicines with your doctor. Some medicines can make you feel dizzy. This can increase your chance of falling. Ask your doctor what other things that you can do to help prevent falls. This information is not intended to replace advice given to you by your health care provider. Make sure you discuss any questions you have with your health care provider. Document Released: 10/17/2008 Document Revised: 05/29/2015 Document Reviewed: 01/25/2014 Elsevier Interactive Patient Education  2017 [employer].

## 2020-12-11 NOTE — Addendum Note (Signed)
Addended by: Kellie Simmering on: 12/11/2020 02:57 PM   Modules accepted: Orders

## 2020-12-12 LAB — CMP14+EGFR
ALT: 16 IU/L (ref 0–32)
AST: 18 IU/L (ref 0–40)
Albumin/Globulin Ratio: 2 (ref 1.2–2.2)
Albumin: 4.7 g/dL (ref 3.7–4.7)
Alkaline Phosphatase: 60 IU/L (ref 44–121)
BUN/Creatinine Ratio: 14 (ref 12–28)
BUN: 31 mg/dL — ABNORMAL HIGH (ref 8–27)
Bilirubin Total: 0.2 mg/dL (ref 0.0–1.2)
CO2: 20 mmol/L (ref 20–29)
Calcium: 9.9 mg/dL (ref 8.7–10.3)
Chloride: 108 mmol/L — ABNORMAL HIGH (ref 96–106)
Creatinine, Ser: 2.24 mg/dL — ABNORMAL HIGH (ref 0.57–1.00)
Globulin, Total: 2.3 g/dL (ref 1.5–4.5)
Glucose: 108 mg/dL — ABNORMAL HIGH (ref 70–99)
Potassium: 4.7 mmol/L (ref 3.5–5.2)
Sodium: 145 mmol/L — ABNORMAL HIGH (ref 134–144)
Total Protein: 7 g/dL (ref 6.0–8.5)
eGFR: 23 mL/min/{1.73_m2} — ABNORMAL LOW (ref >59)

## 2020-12-12 LAB — HEMOGLOBIN A1C
Est. average glucose Bld gHb Est-mCnc: 140 mg/dL
Hgb A1c MFr Bld: 6.5 % — ABNORMAL HIGH (ref 4.8–5.6)

## 2020-12-15 ENCOUNTER — Telehealth: Payer: Medicare Other

## 2021-01-14 DIAGNOSIS — H25813 Combined forms of age-related cataract, bilateral: Secondary | ICD-10-CM | POA: Diagnosis not present

## 2021-01-14 DIAGNOSIS — E119 Type 2 diabetes mellitus without complications: Secondary | ICD-10-CM | POA: Diagnosis not present

## 2021-01-14 LAB — HM DIABETES EYE EXAM

## 2021-01-15 ENCOUNTER — Encounter: Payer: Self-pay | Admitting: Internal Medicine

## 2021-01-26 LAB — HM DIABETES EYE EXAM

## 2021-02-23 ENCOUNTER — Encounter: Payer: Self-pay | Admitting: Internal Medicine

## 2021-02-23 DIAGNOSIS — Z1231 Encounter for screening mammogram for malignant neoplasm of breast: Secondary | ICD-10-CM | POA: Diagnosis not present

## 2021-02-23 LAB — HM MAMMOGRAPHY

## 2021-03-02 LAB — HM MAMMOGRAPHY

## 2021-03-28 ENCOUNTER — Other Ambulatory Visit: Payer: Self-pay | Admitting: Internal Medicine

## 2021-04-08 ENCOUNTER — Other Ambulatory Visit: Payer: Self-pay | Admitting: Internal Medicine

## 2021-04-20 ENCOUNTER — Encounter: Payer: Self-pay | Admitting: Internal Medicine

## 2021-04-20 ENCOUNTER — Ambulatory Visit (INDEPENDENT_AMBULATORY_CARE_PROVIDER_SITE_OTHER): Payer: Medicare Other | Admitting: Internal Medicine

## 2021-04-20 VITALS — BP 120/75 | HR 81 | Temp 98.1°F | Ht 61.0 in | Wt 134.6 lb

## 2021-04-20 DIAGNOSIS — I129 Hypertensive chronic kidney disease with stage 1 through stage 4 chronic kidney disease, or unspecified chronic kidney disease: Secondary | ICD-10-CM

## 2021-04-20 DIAGNOSIS — Z23 Encounter for immunization: Secondary | ICD-10-CM

## 2021-04-20 DIAGNOSIS — Z6825 Body mass index (BMI) 25.0-25.9, adult: Secondary | ICD-10-CM

## 2021-04-20 DIAGNOSIS — E1122 Type 2 diabetes mellitus with diabetic chronic kidney disease: Secondary | ICD-10-CM | POA: Diagnosis not present

## 2021-04-20 DIAGNOSIS — N184 Chronic kidney disease, stage 4 (severe): Secondary | ICD-10-CM | POA: Diagnosis not present

## 2021-04-20 NOTE — Progress Notes (Signed)
?Rich Brave [place]ittleton,acting as a Education administrator for Maximino Greenland, MD.,have documented all relevant documentation on the behalf of Maximino Greenland, MD,as directed by  Maximino Greenland, MD while in the presence of Maximino Greenland, MD.  ?This visit occurred during the SARS-CoV-2 public health emergency.  Safety protocols were in place, including screening questions prior to the visit, additional usage of staff PPE, and extensive cleaning of exam room while observing appropriate contact time as indicated for disinfecting solutions. ? ?Subjective:  ?  ? Patient ID: Holly Peters , female    DOB: 11/17/1950 , 71 y.o.   MRN: [id number] ? ? ?Chief Complaint  ?Patient presents with  ? Diabetes  ? Hypertension  ? ? ?HPI ? ?She reports for diabetes and blood pressure check today. She reports compliance with meds. She denies headaches, chest pain and shortness of breath. She admits she is not exercising as much as she should.  ? ? ?Diabetes ?She presents for her follow-up diabetic visit. She has type 2 diabetes mellitus. Her disease course has been stable. There are no hypoglycemic associated symptoms. Pertinent negatives for diabetes include no blurred vision and no chest pain. There are no hypoglycemic complications. Risk factors for coronary artery disease include diabetes mellitus, dyslipidemia, hypertension, sedentary lifestyle and post-menopausal. Her weight is stable. She is following a diabetic diet. Her breakfast blood glucose is taken between 8-9 am. Her breakfast blood glucose range is generally 90-110 mg/dl. Eye exam is current.  ?Hypertension ?This is a chronic problem. The current episode started more than 1 year ago. The problem has been gradually improving since onset. The problem is controlled. Pertinent negatives include no blurred vision or chest pain.   ? ?Past Medical History:  ?Diagnosis Date  ? Diabetes mellitus without complication ([place])   ? Hyperlipemia   ? Hypertension   ?  ? ?Family History  ?Problem  Relation Age of Onset  ? Diabetes Mother   ? Healthy Father   ? ? ? ?Current Outpatient Medications:  ?  allopurinol (ZYLOPRIM) 100 MG tablet, TAKE 1 TABLET(100 MG) BY MOUTH DAILY, Disp: 90 tablet, Rfl: 1 ?  diltiazem (CARDIZEM CD) 240 MG 24 hr capsule, TAKE 1 CAPSULE DAILY, Disp: 90 capsule, Rfl: 3 ?  FREESTYLE LITE test strip, USE TO CHECK BLOOD SUGAR ONCE DAILY, Disp: 100 strip, Rfl: 3 ?  ibuprofen (ADVIL,MOTRIN) 400 MG tablet, Take 400 mg by mouth every 6 (six) hours as needed., Disp: , Rfl:  ?  MICARDIS HCT 40-12.5 MG tablet, Take 1 tablet by mouth daily., Disp: , Rfl:  ?  Psyllium (METAMUCIL FIBER PO), Take by mouth. prn, Disp: , Rfl:  ?  simvastatin (ZOCOR) 20 MG tablet, TAKE 1 TABLET DAILY, Disp: 90 tablet, Rfl: 3 ?  TRULICITY 9.37 DS/2.8JG SOPN, INJECT 0.75 MG UNDER THE SKIN ONCE A WEEK, Disp: 6 mL, Rfl: 3  ? ?No Known Allergies  ? ?Review of Systems  ?Constitutional: Negative.   ?Eyes:  Negative for blurred vision.  ?Respiratory: Negative.    ?Cardiovascular: Negative.  Negative for chest pain.  ?Neurological: Negative.   ?Psychiatric/Behavioral: Negative.     ? ?Today's Vitals  ? 04/20/21 0816  ?BP: 120/75  ?Pulse: 81  ?Temp: 98.1 ?F (36.7 ?C)  ?Weight: 134 lb 9.6 oz (61.1 kg)  ?Height: '5\' 1"'  (1.549 m)  ?PainSc: 0-No pain  ? ?Body mass index is 25.43 kg/m?.  ?Wt Readings from Last 3 Encounters:  ?04/20/21 134 lb 9.6 oz (61.1 kg)  ?12/11/20 133  lb 3.2 oz (60.4 kg)  ?12/11/20 133 lb 3.2 oz (60.4 kg)  ?  ? ?Objective:  ?Physical Exam ?Vitals and nursing note reviewed.  ?Constitutional:   ?   Appearance: Normal appearance.  ?HENT:  ?   Head: Normocephalic and atraumatic.  ?Eyes:  ?   Extraocular Movements: Extraocular movements intact.  ?Cardiovascular:  ?   Rate and Rhythm: Normal rate and regular rhythm.  ?   Heart sounds: Normal heart sounds.  ?Pulmonary:  ?   Effort: Pulmonary effort is normal.  ?   Breath sounds: Normal breath sounds.  ?Musculoskeletal:  ?   Cervical back: Normal range of motion.   ?Skin: ?   General: Skin is warm.  ?Neurological:  ?   General: No focal deficit present.  ?   Mental Status: She is alert.  ?Psychiatric:     ?   Mood and Affect: Mood normal.     ?   Behavior: Behavior normal.  ?  ? ?   ?Assessment And Plan:  ?   ?1. Type 2 diabetes mellitus with stage 4 chronic kidney disease, without long-term current use of insulin ([place]) ?Comments: Chronic, she reports great BS readings, except for this morning. BS 140. She is encouraged to continue with current meds. I will check labs as below. I will forward her lab results to Dr. Moshe Cipro, her nephrologist. F/u 4 mos. ?- Hemoglobin A1c ?- Urine Albumin-Creatinine with uACR ?- Protein electrophoresis, serum ?- Parathyroid Hormone, Intact w/Ca ?- Phosphorus ? ?2. Parenchymal renal hypertension, stage 1 through stage 4 or unspecified chronic kidney disease ?Comments: Chronic, well controlled. She is also followed by Renal for CKD stage 4, encouraged to stay hydrated, avoid NSAIDs and keep BP/BS controlled.  ?- CMP14+EGFR ?- CBC no Diff ? ?3. Immunization due ?Comments: She was given Pneumovax-23 IM x 1.  ?- Pneumococcal polysaccharide vaccine 23-valent greater than or equal to 2yo subcutaneous/IM ? ?4. BMI 25.0-25.9,adult ?Comments: She is encouraged to aim for at least 150 minutes of exercise per week.  ?  ?Patient was given opportunity to ask questions. Patient verbalized understanding of the plan and was able to repeat key elements of the plan. All questions were answered to their satisfaction.  ? ?I, Maximino Greenland, MD, have reviewed all documentation for this visit. The documentation on 04/20/21 for the exam, diagnosis, procedures, and orders are all accurate and complete.  ? ?IF YOU HAVE BEEN REFERRED TO A SPECIALIST, IT MAY TAKE 1-2 WEEKS TO SCHEDULE/PROCESS THE REFERRAL. IF YOU HAVE NOT HEARD FROM US/SPECIALIST IN TWO WEEKS, PLEASE GIVE [place] A CALL AT [phone].  ? ?THE PATIENT IS ENCOURAGED TO PRACTICE SOCIAL DISTANCING  DUE TO THE COVID-19 PANDEMIC.   ?

## 2021-04-20 NOTE — Patient Instructions (Signed)

## 2021-04-21 ENCOUNTER — Other Ambulatory Visit: Payer: Self-pay | Admitting: Internal Medicine

## 2021-04-22 LAB — CMP14+EGFR
ALT: 19 IU/L (ref 0–32)
AST: 22 IU/L (ref 0–40)
Albumin/Globulin Ratio: 2.1 (ref 1.2–2.2)
Albumin: 4.8 g/dL — ABNORMAL HIGH (ref 3.7–4.7)
Alkaline Phosphatase: 57 IU/L (ref 44–121)
BUN/Creatinine Ratio: 17 (ref 12–28)
BUN: 47 mg/dL — ABNORMAL HIGH (ref 8–27)
Bilirubin Total: 0.2 mg/dL (ref 0.0–1.2)
CO2: 22 mmol/L (ref 20–29)
Calcium: 10 mg/dL (ref 8.7–10.3)
Chloride: 105 mmol/L (ref 96–106)
Creatinine, Ser: 2.72 mg/dL — ABNORMAL HIGH (ref 0.57–1.00)
Globulin, Total: 2.3 g/dL (ref 1.5–4.5)
Glucose: 122 mg/dL — ABNORMAL HIGH (ref 70–99)
Potassium: 4.5 mmol/L (ref 3.5–5.2)
Sodium: 141 mmol/L (ref 134–144)
Total Protein: 7.1 g/dL (ref 6.0–8.5)
eGFR: 18 mL/min/{1.73_m2} — ABNORMAL LOW (ref >59)

## 2021-04-22 LAB — CBC
Hematocrit: 36.1 % (ref 34.0–46.6)
Hemoglobin: 11.7 g/dL (ref 11.1–15.9)
MCH: 29.7 pg (ref 26.6–33.0)
MCHC: 32.4 g/dL (ref 31.5–35.7)
MCV: 92 fL (ref 79–97)
Platelets: 305 10*3/uL (ref 150–450)
RBC: 3.94 x10E6/uL (ref 3.77–5.28)
RDW: 13.7 % (ref 11.7–15.4)
WBC: 9.1 10*3/uL (ref 3.4–10.8)

## 2021-04-22 LAB — PROTEIN ELECTROPHORESIS, SERUM
A/G Ratio: 1.4 (ref 0.7–1.7)
Albumin ELP: 4.2 g/dL (ref 2.9–4.4)
Alpha 1: 0.2 g/dL (ref 0.0–0.4)
Alpha 2: 0.7 g/dL (ref 0.4–1.0)
Beta: 0.8 g/dL (ref 0.7–1.3)
Gamma Globulin: 1.2 g/dL (ref 0.4–1.8)
Globulin, Total: 2.9 g/dL (ref 2.2–3.9)

## 2021-04-22 LAB — PHOSPHORUS: Phosphorus: 4 mg/dL (ref 3.0–4.3)

## 2021-04-22 LAB — MICROALBUMIN / CREATININE URINE RATIO
Creatinine, Urine: 154 mg/dL
Microalb/Creat Ratio: 24 mg/g creat (ref 0–29)
Microalbumin, Urine: 37.1 ug/mL

## 2021-04-22 LAB — HEMOGLOBIN A1C
Est. average glucose Bld gHb Est-mCnc: 140 mg/dL
Hgb A1c MFr Bld: 6.5 % — ABNORMAL HIGH (ref 4.8–5.6)

## 2021-04-22 LAB — PTH, INTACT AND CALCIUM: PTH: 109 pg/mL — ABNORMAL HIGH (ref 15–65)

## 2021-05-05 DIAGNOSIS — I129 Hypertensive chronic kidney disease with stage 1 through stage 4 chronic kidney disease, or unspecified chronic kidney disease: Secondary | ICD-10-CM | POA: Diagnosis not present

## 2021-05-05 DIAGNOSIS — N183 Chronic kidney disease, stage 3 unspecified: Secondary | ICD-10-CM | POA: Diagnosis not present

## 2021-05-05 DIAGNOSIS — E7800 Pure hypercholesterolemia, unspecified: Secondary | ICD-10-CM | POA: Diagnosis not present

## 2021-05-05 DIAGNOSIS — E1122 Type 2 diabetes mellitus with diabetic chronic kidney disease: Secondary | ICD-10-CM | POA: Diagnosis not present

## 2021-05-05 DIAGNOSIS — M109 Gout, unspecified: Secondary | ICD-10-CM | POA: Diagnosis not present

## 2021-07-01 ENCOUNTER — Other Ambulatory Visit: Payer: Self-pay | Admitting: Internal Medicine

## 2021-07-13 DIAGNOSIS — N189 Chronic kidney disease, unspecified: Secondary | ICD-10-CM | POA: Diagnosis not present

## 2021-07-13 DIAGNOSIS — N183 Chronic kidney disease, stage 3 unspecified: Secondary | ICD-10-CM | POA: Diagnosis not present

## 2021-07-22 DIAGNOSIS — E1122 Type 2 diabetes mellitus with diabetic chronic kidney disease: Secondary | ICD-10-CM | POA: Diagnosis not present

## 2021-07-22 DIAGNOSIS — I129 Hypertensive chronic kidney disease with stage 1 through stage 4 chronic kidney disease, or unspecified chronic kidney disease: Secondary | ICD-10-CM | POA: Diagnosis not present

## 2021-07-22 DIAGNOSIS — E7800 Pure hypercholesterolemia, unspecified: Secondary | ICD-10-CM | POA: Diagnosis not present

## 2021-07-22 DIAGNOSIS — M109 Gout, unspecified: Secondary | ICD-10-CM | POA: Diagnosis not present

## 2021-07-22 DIAGNOSIS — N183 Chronic kidney disease, stage 3 unspecified: Secondary | ICD-10-CM | POA: Diagnosis not present

## 2021-08-04 ENCOUNTER — Encounter: Payer: Self-pay | Admitting: Internal Medicine

## 2021-08-05 ENCOUNTER — Other Ambulatory Visit: Payer: Self-pay

## 2021-08-05 MED ORDER — MICARDIS HCT 40-12.5 MG PO TABS
1.0000 | ORAL_TABLET | Freq: Every day | ORAL | 1 refills | Status: DC
Start: 2021-08-05 — End: 2022-02-02

## 2021-08-25 ENCOUNTER — Ambulatory Visit (INDEPENDENT_AMBULATORY_CARE_PROVIDER_SITE_OTHER): Payer: Medicare Other | Admitting: Internal Medicine

## 2021-08-25 ENCOUNTER — Encounter: Payer: Self-pay | Admitting: Internal Medicine

## 2021-08-25 VITALS — BP 108/74 | HR 77 | Temp 98.1°F | Ht 61.0 in | Wt 132.6 lb

## 2021-08-25 DIAGNOSIS — E1122 Type 2 diabetes mellitus with diabetic chronic kidney disease: Secondary | ICD-10-CM | POA: Diagnosis not present

## 2021-08-25 DIAGNOSIS — M1A3720 Chronic gout due to renal impairment, left ankle and foot, without tophus (tophi): Secondary | ICD-10-CM | POA: Diagnosis not present

## 2021-08-25 DIAGNOSIS — Z6825 Body mass index (BMI) 25.0-25.9, adult: Secondary | ICD-10-CM | POA: Insufficient documentation

## 2021-08-25 DIAGNOSIS — N184 Chronic kidney disease, stage 4 (severe): Secondary | ICD-10-CM | POA: Diagnosis not present

## 2021-08-25 DIAGNOSIS — I129 Hypertensive chronic kidney disease with stage 1 through stage 4 chronic kidney disease, or unspecified chronic kidney disease: Secondary | ICD-10-CM | POA: Diagnosis not present

## 2021-08-25 LAB — HEMOGLOBIN A1C
Est. average glucose Bld gHb Est-mCnc: 143 mg/dL
Hgb A1c MFr Bld: 6.6 % — ABNORMAL HIGH (ref 4.8–5.6)

## 2021-08-25 NOTE — Progress Notes (Signed)
I,Victoria T Hamilton,acting as a scribe for Maximino Greenland, MD.,have documented all relevant documentation on the behalf of Maximino Greenland, MD,as directed by  Maximino Greenland, MD while in the presence of Maximino Greenland, MD.   Subjective:     Patient ID: Holly Peters , female    DOB: 07-09-1950 , 71 y.o.   MRN: [id number]   Chief Complaint  Patient presents with   Diabetes    HPI  She reports for diabetes and blood pressure check today. She reports compliance with meds. She denies headaches, chest pain and shortness of breath. She admits she does line dancing.    Diabetes She presents for her follow-up diabetic visit. She has type 2 diabetes mellitus. Her disease course has been stable. There are no hypoglycemic associated symptoms. Pertinent negatives for diabetes include no blurred vision and no chest pain. There are no hypoglycemic complications. Risk factors for coronary artery disease include diabetes mellitus, dyslipidemia, hypertension, sedentary lifestyle and post-menopausal. Her weight is stable. She is following a diabetic diet. Her breakfast blood glucose is taken between 8-9 am. Her breakfast blood glucose range is generally 90-110 mg/dl. Eye exam is current.  Hypertension This is a chronic problem. The current episode started more than 1 year ago. The problem has been gradually improving since onset. The problem is controlled. Pertinent negatives include no blurred vision or chest pain.     Past Medical History:  Diagnosis Date   Diabetes mellitus without complication ([place])    Hyperlipemia    Hypertension      Family History  Problem Relation Age of Onset   Diabetes Mother    Healthy Father      Current Outpatient Medications:    allopurinol (ZYLOPRIM) 100 MG tablet, TAKE 1 TABLET(100 MG) BY MOUTH DAILY, Disp: 90 tablet, Rfl: 1   dapagliflozin propanediol (FARXIGA) 5 MG TABS tablet, , Disp: , Rfl:    diltiazem (CARDIZEM CD) 240 MG 24 hr capsule, TAKE 1  CAPSULE DAILY, Disp: 90 capsule, Rfl: 3   FREESTYLE LITE test strip, USE TO CHECK BLOOD SUGAR ONCE DAILY, Disp: 100 strip, Rfl: 3   MICARDIS HCT 40-12.5 MG tablet, Take 1 tablet by mouth daily., Disp: 90 tablet, Rfl: 1   Psyllium (METAMUCIL FIBER PO), Take by mouth. prn, Disp: , Rfl:    simvastatin (ZOCOR) 20 MG tablet, TAKE 1 TABLET DAILY, Disp: 90 tablet, Rfl: 3   TRULICITY 1.54 MG/8.6PY SOPN, INJECT 0.75 MG UNDER THE SKIN ONCE A WEEK, Disp: 6 mL, Rfl: 3   No Known Allergies   Review of Systems  Constitutional: Negative.   Eyes:  Negative for blurred vision.  Respiratory: Negative.    Cardiovascular: Negative.  Negative for chest pain.  Neurological: Negative.   Psychiatric/Behavioral: Negative.       Today's Vitals   08/25/21 0825  BP: 108/74  Pulse: 77  Temp: 98.1 F (36.7 C)  SpO2: 96%  Weight: 132 lb 9.6 oz (60.1 kg)  Height: '5\' 1"'$  (1.549 m)   Body mass index is 25.05 kg/m.  Wt Readings from Last 3 Encounters:  08/25/21 132 lb 9.6 oz (60.1 kg)  04/20/21 134 lb 9.6 oz (61.1 kg)  12/11/20 133 lb 3.2 oz (60.4 kg)    Objective:  Physical Exam Vitals and nursing note reviewed.  Constitutional:      Appearance: Normal appearance.  HENT:     Head: Normocephalic and atraumatic.  Eyes:     Extraocular Movements: Extraocular movements intact.  Cardiovascular:     Rate and Rhythm: Normal rate and regular rhythm.     Heart sounds: Normal heart sounds.  Pulmonary:     Effort: Pulmonary effort is normal.     Breath sounds: Normal breath sounds.  Musculoskeletal:     Cervical back: Normal range of motion.  Skin:    General: Skin is warm.  Neurological:     General: No focal deficit present.     Mental Status: She is alert.  Psychiatric:        Mood and Affect: Mood normal.        Behavior: Behavior normal.         Assessment And Plan:     1. Type 2 diabetes mellitus with stage 4 chronic kidney disease, without long-term current use of insulin ([facility]) Comments:  Diabetic foot exam was performed. I will check an a1c today, now on Farxiga '5mg'$  and Trulicity 0.'75mg'$  weekly.  She will f/u in 4 months.  - Hemoglobin A1c  2. Parenchymal renal hypertension, stage 1 through stage 4 or unspecified chronic kidney disease Comments: Chronic, well controlled. She will c/w telmisartan/hctz 40/12.'5mg'$  daily and diltiazem DC '240mg'$ . I appreciate Nephrology input.  3. 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   4. BMI 25.0-25.9,adult Comments: She is encouraged to aim for at least 150 minutes of exercise per week.    Patient was given opportunity to ask questions. Patient verbalized understanding of the plan and was able to repeat key elements of the plan. All questions were answered to their satisfaction.   I, Maximino Greenland, MD, have reviewed all documentation for this visit. The documentation on 08/25/21 for the exam, diagnosis, procedures, and orders are all accurate and complete.   IF YOU HAVE BEEN REFERRED TO A SPECIALIST, IT MAY TAKE 1-2 WEEKS TO SCHEDULE/PROCESS THE REFERRAL. IF YOU HAVE NOT HEARD FROM US/SPECIALIST IN TWO WEEKS, PLEASE GIVE [place] A CALL AT [phone].   THE PATIENT IS ENCOURAGED TO PRACTICE SOCIAL DISTANCING DUE TO THE COVID-19 PANDEMIC.

## 2021-08-25 NOTE — Patient Instructions (Signed)

## 2021-08-27 NOTE — Progress Notes (Signed)
This encounter was created in error - please disregard.

## 2021-08-31 ENCOUNTER — Telehealth: Payer: Self-pay | Admitting: Internal Medicine

## 2021-08-31 NOTE — Telephone Encounter (Signed)
I spoke with patient to schedule AWV.  She wanted to let you know she wants to cancel her 09/14/21 care coordination appt.  She stated her insurance will not pay for this

## 2021-09-14 ENCOUNTER — Ambulatory Visit: Payer: Self-pay

## 2021-09-14 NOTE — Patient Outreach (Signed)
  Care Coordination   Follow Up Visit Note   09/14/2021 Name: Holly Peters MRN: [id number] DOB: 05/28/1950  Holly Peters is a 71 y.o. year old female who sees Glendale Chard, MD for primary care. I spoke with  [name] by phone today.  What matters to the patients health and wellness today?  Patient declines nurse care coordination services at this time.     Goals Addressed             This Visit's Progress    COMPLETED: Care Coordination Activites - no further follow up required       Care Coordination Interventions: Determined patient feels her chronic health conditions are being well managed by her PCP, she declines further RN CC follow up at this time Determined patient will contact [facility] RN if services are needed in the future       SDOH assessments and interventions completed:  No     Care Coordination Interventions Activated:  Yes  Care Coordination Interventions:  Yes, provided   Follow up plan: No further intervention required.   Encounter Outcome:  Pt. Visit Completed

## 2021-09-22 ENCOUNTER — Ambulatory Visit (INDEPENDENT_AMBULATORY_CARE_PROVIDER_SITE_OTHER): Payer: Medicare Other

## 2021-09-22 VITALS — BP 122/70 | HR 77 | Temp 97.9°F

## 2021-09-22 DIAGNOSIS — Z23 Encounter for immunization: Secondary | ICD-10-CM

## 2021-09-22 NOTE — Progress Notes (Signed)
Patient presents today for flu shot.  

## 2021-09-22 NOTE — Patient Instructions (Signed)
Influenza Virus Vaccine injection What is this medication? INFLUENZA VIRUS VACCINE (in floo EN zuh VAHY ruhs vak SEEN) helps to reduce the risk of getting influenza also known as the flu. The vaccine only helps protect you against some strains of the flu. This medicine may be used for other purposes; ask your health care provider or pharmacist if you have questions. COMMON BRAND NAME(S): Afluria, Afluria Quadrivalent, Agriflu, Alfuria, FLUAD, FLUAD Quadrivalent, Fluarix, Fluarix Quadrivalent, Flublok, Flublok Quadrivalent, FLUCELVAX, FLUCELVAX Quadrivalent, Flulaval, Flulaval Quadrivalent, Fluvirin, Fluzone, Fluzone High-Dose, Fluzone Intradermal, Fluzone Quadrivalent What should I tell my care team before I take this medication? They need to know if you have any of these conditions: bleeding disorder like hemophilia fever or infection Guillain-Barre syndrome or other neurological problems immune system problems infection with the human immunodeficiency virus (HIV) or AIDS low blood platelet counts multiple sclerosis an unusual or allergic reaction to influenza virus vaccine, latex, other medicines, foods, dyes, or preservatives. Different brands of vaccines contain different allergens. Some may contain latex or eggs. Talk to your doctor about your allergies to make sure that you get the right vaccine. pregnant or trying to get pregnant breast-feeding How should I use this medication? This vaccine is for injection into a muscle or under the skin. It is given by a health care professional. A copy of Vaccine Information Statements will be given before each vaccination. Read this sheet carefully each time. The sheet may change frequently. Talk to your healthcare provider to see which vaccines are right for you. Some vaccines should not be used in all age groups. Overdosage: If you think you have taken too much of this medicine contact a poison control center or emergency room at once. NOTE: This  medicine is only for you. Do not share this medicine with others. What if I miss a dose? This does not apply. What may interact with this medication? chemotherapy or radiation therapy medicines that lower your immune system like etanercept, anakinra, infliximab, and adalimumab medicines that treat or prevent blood clots like warfarin phenytoin steroid medicines like prednisone or cortisone theophylline vaccines This list may not describe all possible interactions. Give your health care provider a list of all the medicines, herbs, non-prescription drugs, or dietary supplements you use. Also tell them if you smoke, drink alcohol, or use illegal drugs. Some items may interact with your medicine. What should I watch for while using this medication? Report any side effects that do not go away within 3 days to your doctor or health care professional. Call your health care provider if any unusual symptoms occur within 6 weeks of receiving this vaccine. You may still catch the flu, but the illness is not usually as bad. You cannot get the flu from the vaccine. The vaccine will not protect against colds or other illnesses that may cause fever. The vaccine is needed every year. What side effects may I notice from receiving this medication? Side effects that you should report to your doctor or health care professional as soon as possible: allergic reactions like skin rash, itching or hives, swelling of the face, lips, or tongue Side effects that usually do not require medical attention (report to your doctor or health care professional if they continue or are bothersome): fever headache muscle aches and pains pain, tenderness, redness, or swelling at the injection site tiredness This list may not describe all possible side effects. Call your doctor for medical advice about side effects. You may report side effects to FDA at 1-800-FDA-1088.   Where should I keep my medication? The vaccine will be given  by a health care professional in a clinic, pharmacy, doctor's office, or other health care setting. You will not be given vaccine doses to store at home. NOTE: This sheet is a summary. It may not cover all possible information. If you have questions about this medicine, talk to your doctor, pharmacist, or health care provider.  2023 Elsevier/Gold Standard (2020-07-25 00:00:00)  

## 2021-10-01 ENCOUNTER — Other Ambulatory Visit: Payer: Self-pay | Admitting: Internal Medicine

## 2021-10-08 DIAGNOSIS — N189 Chronic kidney disease, unspecified: Secondary | ICD-10-CM | POA: Diagnosis not present

## 2021-10-08 DIAGNOSIS — M109 Gout, unspecified: Secondary | ICD-10-CM | POA: Diagnosis not present

## 2021-10-08 DIAGNOSIS — I129 Hypertensive chronic kidney disease with stage 1 through stage 4 chronic kidney disease, or unspecified chronic kidney disease: Secondary | ICD-10-CM | POA: Diagnosis not present

## 2021-10-08 DIAGNOSIS — E1122 Type 2 diabetes mellitus with diabetic chronic kidney disease: Secondary | ICD-10-CM | POA: Diagnosis not present

## 2021-10-08 DIAGNOSIS — N390 Urinary tract infection, site not specified: Secondary | ICD-10-CM | POA: Diagnosis not present

## 2021-10-08 DIAGNOSIS — N183 Chronic kidney disease, stage 3 unspecified: Secondary | ICD-10-CM | POA: Diagnosis not present

## 2021-10-08 DIAGNOSIS — E7800 Pure hypercholesterolemia, unspecified: Secondary | ICD-10-CM | POA: Diagnosis not present

## 2021-10-10 LAB — BASIC METABOLIC PANEL
BUN: 42 — AB (ref 4–21)
CO2: 20 (ref 13–22)
Chloride: 105 (ref 99–108)
Creatinine: 2.7 — AB (ref 0.5–1.1)
Glucose: 116
Potassium: 4.2 mEq/L (ref 3.5–5.1)
Sodium: 143 (ref 137–147)

## 2021-10-10 LAB — COMPREHENSIVE METABOLIC PANEL
Albumin: 4.7 (ref 3.5–5.0)
Calcium: 9.8 (ref 8.7–10.7)
eGFR: 18

## 2021-10-10 LAB — CBC AND DIFFERENTIAL: Hemoglobin: 12.2 (ref 12.0–16.0)

## 2021-10-10 LAB — IRON,TIBC AND FERRITIN PANEL: Ferritin: 306

## 2021-10-12 ENCOUNTER — Other Ambulatory Visit: Payer: Self-pay | Admitting: Internal Medicine

## 2021-10-28 ENCOUNTER — Ambulatory Visit (INDEPENDENT_AMBULATORY_CARE_PROVIDER_SITE_OTHER): Payer: Medicare Other | Admitting: Sports Medicine

## 2021-10-28 VITALS — BP 141/68 | Ht 61.5 in | Wt 134.0 lb

## 2021-10-28 DIAGNOSIS — M62838 Other muscle spasm: Secondary | ICD-10-CM | POA: Diagnosis not present

## 2021-10-28 MED ORDER — PREDNISONE 10 MG PO TABS: ORAL_TABLET | ORAL | 0 refills | Status: DC

## 2021-10-28 MED ORDER — METHOCARBAMOL 500 MG PO TABS: ORAL_TABLET | ORAL | 0 refills | Status: DC

## 2021-10-28 NOTE — Assessment & Plan Note (Signed)
Patient is having some neck spasm and hypertonicity.  In light of her CKD I recommended discontinuation of ibuprofen and prescription for prednisone steroid taper was sent today.  Last reported A1c well controlled, 6.6 I discussed with her that this may increase her blood sugars and she verbalized understanding.  I also sent in a muscle relaxer, Robaxin 500 mg for relief.  I counseled her that this may cause some drowsiness and to be very cautious when getting up in the middle of the night.  She should cut tablets in half and if she can tolerate them during the day she may try that.  I recommend discontinuation after 7 days.  She was also given some neck stretches to perform at home.  If she has no improvement in her symptoms over the next 7 days would recommend follow-up.  I anticipate she will do very well and begin to have some relief.  She may use a heat as needed for some relief as well.

## 2021-10-28 NOTE — Progress Notes (Signed)
   New Patient Office Visit  Subjective   Patient ID: Holly Peters, female    DOB: 04-Jun-1950  Age: 71 y.o. MRN: [id number]  Neck pain and stiffness.  She presents today, typically is a patient of Dr. Raeford Razor however his not in office currently, with chief complaint of neck pain and stiffness with some spasm that began on Sunday.  She reports she just woke up with the discomfort, does not recall any specific incident or injury.  She does report she has been under increased stress lately as her brother-in-law that was living with them was moved to hospice and passed away on 04/11/2022.  She reports her pain and discomfort is on both sides of her neck and up into the base of her head.  She has decreased range of motion.  She has tried ibuprofen, extra strength Tylenol and heat with very brief relief of her pain.  She denies any radiation of pain down her arms or any numbness and tingling.   ROS as listed above in HPI    Objective:     BP (!) 141/68   Ht 5' 1.5" (1.562 m)   Wt 134 lb (60.8 kg)   BMI 24.91 kg/m   Physical Exam Vitals reviewed.  Constitutional:      General: She is not in acute distress.    Appearance: Normal appearance. She is normal weight. She is not ill-appearing, toxic-appearing or diaphoretic.  Pulmonary:     Effort: Pulmonary effort is normal.  Neurological:     Mental Status: She is alert.   Neck: No obvious deformity or asymmetry.  No ecchymosis or edema.  She does have some mild discomfort to palpation midline of her cervical spine.  Paraspinal and bilateral upper trapezius muscle hypertonicity to palpation.  No identifiable trigger points.  Decreased range of motion with neck extension, flexion, sidebending and rotation secondary to pain.  Negative Spurling's bilaterally.  Distal pulses 2+ bilateral.    Assessment & Plan:   Problem List Items Addressed This Visit       Musculoskeletal and Integument   Neck muscle spasm - Primary    Patient is having  some neck spasm and hypertonicity.  In light of her CKD I recommended discontinuation of ibuprofen and prescription for prednisone steroid taper was sent today.  Last reported A1c well controlled, 6.6 I discussed with her that this may increase her blood sugars and she verbalized understanding.  I also sent in a muscle relaxer, Robaxin 500 mg for relief.  I counseled her that this may cause some drowsiness and to be very cautious when getting up in the middle of the night.  She should cut tablets in half and if she can tolerate them during the day she may try that.  I recommend discontinuation after 7 days.  She was also given some neck stretches to perform at home.  If she has no improvement in her symptoms over the next 7 days would recommend follow-up.  I anticipate she will do very well and begin to have some relief.  She may use a heat as needed for some relief as well.       Return if symptoms worsen or fail to improve.    Elmore Guise, DO  Addendum:  Patient seen in the office by fellow.  Her history, exam, plan of care were precepted with me.  Karlton Lemon MD Kirt Boys

## 2021-10-31 ENCOUNTER — Encounter: Payer: Self-pay | Admitting: Sports Medicine

## 2021-12-09 ENCOUNTER — Ambulatory Visit: Payer: Medicare Other

## 2021-12-09 ENCOUNTER — Encounter: Payer: Self-pay | Admitting: Internal Medicine

## 2021-12-09 ENCOUNTER — Ambulatory Visit (INDEPENDENT_AMBULATORY_CARE_PROVIDER_SITE_OTHER): Payer: Medicare Other | Admitting: Internal Medicine

## 2021-12-09 VITALS — BP 130/78 | HR 68 | Temp 98.6°F | Ht 61.0 in | Wt 131.0 lb

## 2021-12-09 DIAGNOSIS — M542 Cervicalgia: Secondary | ICD-10-CM | POA: Diagnosis not present

## 2021-12-09 DIAGNOSIS — M1A3720 Chronic gout due to renal impairment, left ankle and foot, without tophus (tophi): Secondary | ICD-10-CM | POA: Diagnosis not present

## 2021-12-09 DIAGNOSIS — Z79899 Other long term (current) drug therapy: Secondary | ICD-10-CM | POA: Diagnosis not present

## 2021-12-09 DIAGNOSIS — I129 Hypertensive chronic kidney disease with stage 1 through stage 4 chronic kidney disease, or unspecified chronic kidney disease: Secondary | ICD-10-CM

## 2021-12-09 DIAGNOSIS — Z6824 Body mass index (BMI) 24.0-24.9, adult: Secondary | ICD-10-CM | POA: Diagnosis not present

## 2021-12-09 DIAGNOSIS — E1122 Type 2 diabetes mellitus with diabetic chronic kidney disease: Secondary | ICD-10-CM | POA: Diagnosis not present

## 2021-12-09 DIAGNOSIS — N184 Chronic kidney disease, stage 4 (severe): Secondary | ICD-10-CM | POA: Diagnosis not present

## 2021-12-09 NOTE — Patient Instructions (Signed)

## 2021-12-09 NOTE — Progress Notes (Signed)
Rich Brave [place],acting as a Education administrator for Maximino Greenland, MD.,have documented all relevant documentation on the behalf of Maximino Greenland, MD,as directed by  Maximino Greenland, MD while in the presence of Maximino Greenland, MD.    Subjective:     Patient ID: Holly Peters , female    DOB: 1950/04/06 , 71 y.o.   MRN: [id number]   Chief Complaint  Patient presents with   Diabetes   Hypertension    HPI  She reports for diabetes and blood pressure check today. She reports compliance with meds. She denies headaches, chest pain and shortness of breath.    Diabetes She presents for her follow-up diabetic visit. She has type 2 diabetes mellitus. Her disease course has been stable. There are no hypoglycemic associated symptoms. Pertinent negatives for diabetes include no blurred vision, no polydipsia, no polyphagia and no polyuria. There are no hypoglycemic complications. Risk factors for coronary artery disease include diabetes mellitus, dyslipidemia, hypertension, sedentary lifestyle and post-menopausal. Her weight is stable. She is following a diabetic diet. Her breakfast blood glucose is taken between 8-9 am. Her breakfast blood glucose range is generally 90-110 mg/dl. Eye exam is current.  Hypertension This is a chronic problem. The current episode started more than 1 year ago. The problem has been gradually improving since onset. The problem is controlled. Associated symptoms include neck pain. Pertinent negatives include no blurred vision.     Past Medical History:  Diagnosis Date   Diabetes mellitus without complication ([place])    Hyperlipemia    Hypertension      Family History  Problem Relation Age of Onset   Diabetes Mother    Healthy Father      Current Outpatient Medications:    allopurinol (ZYLOPRIM) 100 MG tablet, TAKE 1 TABLET(100 MG) BY MOUTH DAILY, Disp: 90 tablet, Rfl: 1   dapagliflozin propanediol (FARXIGA) 5 MG TABS tablet, , Disp: , Rfl:    diltiazem  (CARDIZEM CD) 240 MG 24 hr capsule, TAKE 1 CAPSULE DAILY, Disp: 90 capsule, Rfl: 3   FREESTYLE LITE test strip, USE TO CHECK BLOOD SUGAR ONCE DAILY, Disp: 100 strip, Rfl: 3   MICARDIS HCT 40-12.5 MG tablet, Take 1 tablet by mouth daily., Disp: 90 tablet, Rfl: 1   Psyllium (METAMUCIL FIBER PO), Take by mouth. prn, Disp: , Rfl:    simvastatin (ZOCOR) 20 MG tablet, TAKE 1 TABLET DAILY, Disp: 90 tablet, Rfl: 3   TRULICITY 3.82 NK/5.3ZJ SOPN, INJECT 0.75 MG UNDER THE SKIN ONCE A WEEK, Disp: 6 mL, Rfl: 3   No Known Allergies   Review of Systems  Constitutional: Negative.   Eyes: Negative.  Negative for blurred vision.  Respiratory: Negative.    Cardiovascular: Negative.   Endocrine: Negative for polydipsia, polyphagia and polyuria.  Musculoskeletal:  Positive for neck pain.  Skin: Negative.   Neurological: Negative.   Psychiatric/Behavioral: Negative.       Today's Vitals   12/09/21 1546  BP: 130/78  Pulse: 68  Temp: 98.6 F (37 C)  Weight: 131 lb (59.4 kg)  Height: '5\' 1"'$  (1.549 m)  PainSc: 0-No pain   Body mass index is 24.75 kg/m.  Wt Readings from Last 3 Encounters:  12/09/21 131 lb (59.4 kg)  10/28/21 134 lb (60.8 kg)  08/25/21 132 lb 9.6 oz (60.1 kg)    Objective:  Physical Exam Vitals and nursing note reviewed.  Constitutional:      Appearance: Normal appearance.  HENT:     Head: Normocephalic  and atraumatic.     Nose:     Comments: Masked     Mouth/Throat:     Comments: Masked  Eyes:     Extraocular Movements: Extraocular movements intact.  Neck:     Comments: Decreased ROM Cardiovascular:     Rate and Rhythm: Normal rate and regular rhythm.     Heart sounds: Normal heart sounds.  Pulmonary:     Effort: Pulmonary effort is normal.     Breath sounds: Normal breath sounds.  Musculoskeletal:     Cervical back: Normal range of motion. Tenderness present.  Skin:    General: Skin is warm.  Neurological:     General: No focal deficit present.     Mental  Status: She is alert.  Psychiatric:        Mood and Affect: Mood normal.        Behavior: Behavior normal.       Assessment And Plan:     1. Type 2 diabetes mellitus with stage 4 chronic kidney disease, without long-term current use of insulin (HCC) Comments: Chronic, I will check a1c today. She will c/w [place] '5mg'$  daily. Encouraged to keep up with Nephrology f/u appts. - Hemoglobin A1c  2. Parenchymal renal hypertension, stage 1 through stage 4 or unspecified chronic kidney disease Comments: Chronic, fair control. She will c/w diltiazem '240mg'$  and telmisartan/hct. Will consider d/c HCT due to current CKD status.  3. Cervicalgia Comments: She is advised to perform stretching exercises (as demonstrated). Also advised to apply topical mentholated cream to affected area nightly.  4. 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 She is encouraged to avoid known triggers. She will c/w allopurinol '100mg'$  daily. - Uric acid  5. BMI 24.0-24.9, adult Comments: She is encouraged to aim for at least 150 minutes of exercise per week.  6. Drug therapy - Liver Profile   Patient was given opportunity to ask questions. Patient verbalized understanding of the plan and was able to repeat key elements of the plan. All questions were answered to their satisfaction.   I, Maximino Greenland, MD, have reviewed all documentation for this visit. The documentation on 12/09/21 for the exam, diagnosis, procedures, and orders are all accurate and complete.   IF YOU HAVE BEEN REFERRED TO A SPECIALIST, IT MAY TAKE 1-2 WEEKS TO SCHEDULE/PROCESS THE REFERRAL. IF YOU HAVE NOT HEARD FROM US/SPECIALIST IN TWO WEEKS, PLEASE GIVE [place] A CALL AT [phone].   THE PATIENT IS ENCOURAGED TO PRACTICE SOCIAL DISTANCING DUE TO THE COVID-19 PANDEMIC.

## 2021-12-10 LAB — HEPATIC FUNCTION PANEL
ALT: 17 IU/L (ref 0–32)
AST: 21 IU/L (ref 0–40)
Albumin: 4.8 g/dL (ref 3.8–4.8)
Alkaline Phosphatase: 62 IU/L (ref 44–121)
Bilirubin Total: 0.4 mg/dL (ref 0.0–1.2)
Bilirubin, Direct: 0.12 mg/dL (ref 0.00–0.40)
Total Protein: 7.1 g/dL (ref 6.0–8.5)

## 2021-12-10 LAB — HEMOGLOBIN A1C
Est. average glucose Bld gHb Est-mCnc: 148 mg/dL
Hgb A1c MFr Bld: 6.8 % — ABNORMAL HIGH (ref 4.8–5.6)

## 2021-12-10 LAB — URIC ACID: Uric Acid: 5.1 mg/dL (ref 3.1–7.9)

## 2021-12-16 ENCOUNTER — Ambulatory Visit (INDEPENDENT_AMBULATORY_CARE_PROVIDER_SITE_OTHER): Payer: Medicare Other

## 2021-12-16 VITALS — Ht 61.5 in | Wt 130.0 lb

## 2021-12-16 DIAGNOSIS — Z0000 Encounter for general adult medical examination without abnormal findings: Secondary | ICD-10-CM | POA: Diagnosis not present

## 2021-12-16 NOTE — Patient Instructions (Addendum)
Ms. Holly Peters , Thank you for taking time to come for your Medicare Wellness Visit. I appreciate your ongoing commitment to your health goals. Please review the following plan we discussed and let me know if I can assist you in the future.   These are the goals we discussed:  Goals      Patient Stated     10/03/2018, wants to maintain     Patient Stated     12/12/2019, stay healthy     Patient Stated     12/11/2020, stay healthy     Patient Stated     12/16/2021, no goals        This is a list of the screening recommended for you and due dates:  Health Maintenance  Topic Date Due   COVID-19 Vaccine (6 - 2023-24 season) 09/04/2021   Eye exam for diabetics  01/14/2022   Mammogram  02/23/2022   Yearly kidney health urinalysis for diabetes  04/21/2022   Hemoglobin A1C  06/10/2022   Complete foot exam   08/26/2022   Yearly kidney function blood test for diabetes  10/11/2022   Medicare Annual Wellness Visit  12/17/2022   Colon Cancer Screening  03/24/2027   DTaP/Tdap/Td vaccine (2 - Td or Tdap) 01/24/2031   Pneumonia Vaccine  Completed   Flu Shot  Completed   DEXA scan (bone density measurement)  Completed   Hepatitis C Screening: USPSTF Recommendation to screen - Ages 37-79 yo.  Completed   Zoster (Shingles) Vaccine  Completed   HPV Vaccine  Aged Out    Advanced directives: Advance directive discussed with you today. Even though you declined this today please call our office should you change your mind and we can give you the proper paperwork for you to fill out.   Conditions/risks identified: none  Next appointment: Follow up in one year for your annual wellness visit    Preventive Care 65 Years and Older, Female Preventive care refers to lifestyle choices and visits with your health care provider that can promote health and wellness. What does preventive care include? A yearly physical exam. This is also called an annual well check. Dental exams once or twice a  year. Routine eye exams. Ask your health care provider how often you should have your eyes checked. Personal lifestyle choices, including: Daily care of your teeth and gums. Regular physical activity. Eating a healthy diet. Avoiding tobacco and drug use. Limiting alcohol use. Practicing safe sex. Taking low-dose aspirin every day. Taking vitamin and mineral supplements as recommended by your health care provider. What happens during an annual well check? The services and screenings done by your health care provider during your annual well check will depend on your age, overall health, lifestyle risk factors, and family history of disease. Counseling  Your health care provider may ask you questions about your: Alcohol use. Tobacco use. Drug use. Emotional well-being. Home and relationship well-being. Sexual activity. Eating habits. History of falls. Memory and ability to understand (cognition). Work and work Statistician. Reproductive health. Screening  You may have the following tests or measurements: Height, weight, and BMI. Blood pressure. Lipid and cholesterol levels. These may be checked every 5 years, or more frequently if you are over 69 years old. Skin check. Lung cancer screening. You may have this screening every year starting at age 79 if you have a 30-pack-year history of smoking and currently smoke or have quit within the past 15 years. Fecal occult blood test (FOBT) of the stool. You  may have this test every year starting at age 22. Flexible sigmoidoscopy or colonoscopy. You may have a sigmoidoscopy every 5 years or a colonoscopy every 10 years starting at age 34. Hepatitis C blood test. Hepatitis B blood test. Sexually transmitted disease (STD) testing. Diabetes screening. This is done by checking your blood sugar (glucose) after you have not eaten for a while (fasting). You may have this done every 1-3 years. Bone density scan. This is done to screen for  osteoporosis. You may have this done starting at age 13. Mammogram. This may be done every 1-2 years. Talk to your health care provider about how often you should have regular mammograms. Talk with your health care provider about your test results, treatment options, and if necessary, the need for more tests. Vaccines  Your health care provider may recommend certain vaccines, such as: Influenza vaccine. This is recommended every year. Tetanus, diphtheria, and acellular pertussis (Tdap, Td) vaccine. You may need a Td booster every 10 years. Zoster vaccine. You may need this after age 61. Pneumococcal 13-valent conjugate (PCV13) vaccine. One dose is recommended after age 4. Pneumococcal polysaccharide (PPSV23) vaccine. One dose is recommended after age 68. Talk to your health care provider about which screenings and vaccines you need and how often you need them. This information is not intended to replace advice given to you by your health care provider. Make sure you discuss any questions you have with your health care provider. Document Released: 01/17/2015 Document Revised: 09/10/2015 Document Reviewed: 10/22/2014 Elsevier Interactive Patient Education  2017 [place] Prevention in the Home Falls can cause injuries. They can happen to people of all ages. There are many things you can do to make your home safe and to help prevent falls. What can I do on the outside of my home? Regularly fix the edges of walkways and driveways and fix any cracks. Remove anything that might make you trip as you walk through a door, such as a raised step or threshold. Trim any bushes or trees on the path to your home. Use bright outdoor lighting. Clear any walking paths of anything that might make someone trip, such as rocks or tools. Regularly check to see if handrails are loose or broken. Make sure that both sides of any steps have handrails. Any raised decks and porches should have guardrails on  the edges. Have any leaves, snow, or ice cleared regularly. Use sand or salt on walking paths during winter. Clean up any spills in your garage right away. This includes oil or grease spills. What can I do in the bathroom? Use night lights. Install grab bars by the toilet and in the tub and shower. Do not use towel bars as grab bars. Use non-skid mats or decals in the tub or shower. If you need to sit down in the shower, use a plastic, non-slip stool. Keep the floor dry. Clean up any water that spills on the floor as soon as it happens. Remove soap buildup in the tub or shower regularly. Attach bath mats securely with double-sided non-slip rug tape. Do not have throw rugs and other things on the floor that can make you trip. What can I do in the bedroom? Use night lights. Make sure that you have a light by your bed that is easy to reach. Do not use any sheets or blankets that are too big for your bed. They should not hang down onto the floor. Have a firm chair that has side  arms. You can use this for support while you get dressed. Do not have throw rugs and other things on the floor that can make you trip. What can I do in the kitchen? Clean up any spills right away. Avoid walking on wet floors. Keep items that you use a lot in easy-to-reach places. If you need to reach something above you, use a strong step stool that has a grab bar. Keep electrical cords out of the way. Do not use floor polish or wax that makes floors slippery. If you must use wax, use non-skid floor wax. Do not have throw rugs and other things on the floor that can make you trip. What can I do with my stairs? Do not leave any items on the stairs. Make sure that there are handrails on both sides of the stairs and use them. Fix handrails that are broken or loose. Make sure that handrails are as long as the stairways. Check any carpeting to make sure that it is firmly attached to the stairs. Fix any carpet that is loose  or worn. Avoid having throw rugs at the top or bottom of the stairs. If you do have throw rugs, attach them to the floor with carpet tape. Make sure that you have a light switch at the top of the stairs and the bottom of the stairs. If you do not have them, ask someone to add them for you. What else can I do to help prevent falls? Wear shoes that: Do not have high heels. Have rubber bottoms. Are comfortable and fit you well. Are closed at the toe. Do not wear sandals. If you use a stepladder: Make sure that it is fully opened. Do not climb a closed stepladder. Make sure that both sides of the stepladder are locked into place. Ask someone to hold it for you, if possible. Clearly mark and make sure that you can see: Any grab bars or handrails. First and last steps. Where the edge of each step is. Use tools that help you move around (mobility aids) if they are needed. These include: Canes. Walkers. Scooters. Crutches. Turn on the lights when you go into a dark area. Replace any light bulbs as soon as they burn out. Set up your furniture so you have a clear path. Avoid moving your furniture around. If any of your floors are uneven, fix them. If there are any pets around you, be aware of where they are. Review your medicines with your doctor. Some medicines can make you feel dizzy. This can increase your chance of falling. Ask your doctor what other things that you can do to help prevent falls. This information is not intended to replace advice given to you by your health care provider. Make sure you discuss any questions you have with your health care provider. Document Released: 10/17/2008 Document Revised: 05/29/2015 Document Reviewed: 01/25/2014 Elsevier Interactive Patient Education  2017 [employer].

## 2021-12-16 NOTE — Progress Notes (Signed)
I connected with Varie Macnair today by telephone and verified that I am speaking with the correct person using two identifiers. Location patient: home Location provider: work Persons participating in the virtual visit: Kyeisha Higher education careers adviser, [employer] LPN.   I discussed the limitations, risks, security and privacy concerns of performing an evaluation and management service by telephone and the availability of in person appointments. I also discussed with the patient that there may be a patient responsible charge related to this service. The patient expressed understanding and verbally consented to this telephonic visit.    Interactive audio and video telecommunications were attempted between this provider and patient, however failed, due to patient having technical difficulties OR patient did not have access to video capability.  We continued and completed visit with audio only.     Vital signs may be patient reported or missing.  Subjective:   [name] is a 71 y.o. female who presents for Medicare Annual (Subsequent) preventive examination.  Review of Systems     Cardiac Risk Factors include: advanced age (>60mn, >>66women);diabetes mellitus;hypertension     Objective:    Today's Vitals   12/16/21 1202  Weight: 130 lb (59 kg)  Height: 5' 1.5" (1.562 m)   Body mass index is 24.17 kg/m.     12/16/2021   12:05 PM 12/11/2020    8:49 AM 09/10/2020    1:53 PM 12/12/2019    3:55 PM 10/03/2018   11:35 AM 11/14/2017    2:20 PM 11/11/2017    8:38 PM  Advanced Directives  Does Patient Have a Medical Advance Directive? _0  No No  Type of AParamedicof AAtmautluakLiving will [place] will  Living will [place] will    Copy of HP[place] Chart? No - copy requested No - copy requested   No - copy requested      Current Medications (verified) Outpatient  Encounter Medications as of 12/16/2021  Medication Sig   allopurinol (ZYLOPRIM) 100 MG tablet TAKE 1 TABLET(100 MG) BY MOUTH DAILY   dapagliflozin propanediol (FARXIGA) 5 MG TABS tablet    diltiazem (CARDIZEM CD) 240 MG 24 hr capsule TAKE 1 CAPSULE DAILY   FREESTYLE LITE test strip USE TO CHECK BLOOD SUGAR ONCE DAILY   MICARDIS HCT 40-12.5 MG tablet Take 1 tablet by mouth daily.   Psyllium (METAMUCIL FIBER PO) Take by mouth. prn   simvastatin (ZOCOR) 20 MG tablet TAKE 1 TABLET DAILY   TRULICITY 04.13MKG/4.0NUSOPN INJECT 0.75 MG UNDER THE SKIN ONCE A WEEK   No facility-administered encounter medications on file as of 12/16/2021.    Allergies (verified) Patient has no known allergies.   History: Past Medical History:  Diagnosis Date   Diabetes mellitus without complication ([place]    Hyperlipemia    Hypertension    Past Surgical History:  Procedure Laterality Date   ABDOMINAL HYSTERECTOMY     Family History  Problem Relation Age of Onset   Diabetes Mother    Healthy Father    Social History   Socioeconomic History   Marital status: Married    Spouse name: Not on file   Number of children: Not on file   Years of education: Not on file   Highest education level: Not on file  Occupational History   Occupation: retired  Tobacco Use   Smoking status: Never   Smokeless tobacco: Never  Vaping Use   Vaping Use:  Never used  Substance and Sexual Activity   Alcohol use: No   Drug use: No   Sexual activity: Yes    Birth control/protection: Surgical  Other Topics Concern   Not on file  Social History Narrative   Not on file   Social Determinants of Health   Financial Resource Strain: Low Risk  (12/16/2021)   Overall Financial Resource Strain (CARDIA)    Difficulty of Paying Living Expenses: Not hard at all  Food Insecurity: No Food Insecurity (12/16/2021)   Hunger Vital Sign    Worried About Running Out of Food in the Last Year: Never true    Ran Out of Food in the  Last Year: Never true  Transportation Needs: No Transportation Needs (12/16/2021)   PRAPARE - Hydrologist (Medical): No    Lack of Transportation (Non-Medical): No  Physical Activity: Sufficiently Active (12/16/2021)   Exercise Vital Sign    Days of Exercise per Week: 3 days    Minutes of Exercise per Session: 60 min  Stress: No Stress Concern Present (12/16/2021)   [place]    Feeling of Stress : Not at all  Social Connections: Not on file    Tobacco Counseling Counseling given: Not Answered   Clinical Intake:  Pre-visit preparation completed: Yes  Pain : No/denies pain     Nutritional Status: BMI of 19-24  Normal Nutritional Risks: None Diabetes: Yes  How often do you need to have someone help you when you read instructions, pamphlets, or other written materials from your doctor or pharmacy?: 1 - Never  Diabetic? Yes Nutrition Risk Assessment:  Has the patient had any N/V/D within the last 2 months?  No  Does the patient have any non-healing wounds?  No  Has the patient had any unintentional weight loss or weight gain?  No   Diabetes:  Is the patient diabetic?  Yes  If diabetic, was a CBG obtained today?  No  Did the patient bring in their glucometer from home?  No  How often do you monitor your CBG's? daily.   Financial Strains and Diabetes Management:  Are you having any financial strains with the device, your supplies or your medication? No .  Does the patient want to be seen by Chronic Care Management for management of their diabetes?  No  Would the patient like to be referred to a Nutritionist or for Diabetic Management?  No   Diabetic Exams:  Diabetic Eye Exam: Completed 01/14/2021 Diabetic Foot Exam: Completed 08/25/2021   Interpreter Needed?: No  Information entered by :: NAllen LPN   Activities of Daily Living    12/16/2021   12:06 PM  In your  present state of health, do you have any difficulty performing the following activities:  Hearing? 0  Vision? 0  Difficulty concentrating or making decisions? 0  Walking or climbing stairs? 0  Dressing or bathing? 0  Doing errands, shopping? 0  Preparing Food and eating ? N  Using the Toilet? N  In the past six months, have you accidently leaked urine? N  Do you have problems with loss of bowel control? N  Managing your Medications? N  Managing your Finances? N  Housekeeping or managing your Housekeeping? N    Patient Care Team: Glendale Chard, MD as PCP - General (Internal Medicine) Rex Kras, Claudette Stapler, RN as [place] any recent Medical Services you may have received  from other than [facility] providers in the past year (date may be approximate).     Assessment:   This is a routine wellness examination for Holly Peters.  Hearing/Vision screen Vision Screening - Comments:: Regular eye exams, [facility]  Dietary issues and exercise activities discussed: Current Exercise Habits: Structured exercise class, Type of exercise: Other - see comments (line dancing), Time (Minutes): 60, Frequency (Times/Week): 3, Weekly Exercise (Minutes/Week): 180   Goals Addressed             This Visit's Progress    Patient Stated       12/16/2021, no goals       Depression Screen    12/16/2021   12:05 PM 12/11/2020    8:50 AM 12/12/2019    3:56 PM 11/21/2018    9:06 AM 10/11/2018    9:34 AM 10/03/2018   11:35 AM 05/30/2018    8:58 AM  PHQ 2/9 Scores  PHQ - 2 Score 0 0 0 0 0 0 0  PHQ- 9 Score      3     Fall Risk    12/16/2021   12:05 PM 12/11/2020    8:50 AM 12/12/2019    3:56 PM 11/21/2018    9:06 AM 10/11/2018    9:34 AM  [place] in the past year? 0 0 0 0 0  Number falls in past yr: 0      Injury with Fall? 0      Risk for fall due to : Medication side effect Medication side effect Medication side effect    Follow up Falls  prevention discussed;Education provided;Falls evaluation completed Falls evaluation completed;Education provided;Falls prevention discussed Falls evaluation completed;Education provided;Falls prevention discussed      FALL RISK PREVENTION PERTAINING TO THE HOME:  Any stairs in or around the home? No  If so, are there any without handrails? N/a Home free of loose throw rugs in walkways, pet beds, electrical cords, etc? Yes  Adequate lighting in your home to reduce risk of falls? Yes   ASSISTIVE DEVICES UTILIZED TO PREVENT FALLS:  Life alert? No  Use of a cane, walker or w/c? No  Grab bars in the bathroom? Yes  Shower chair or bench in shower? Yes  Elevated toilet seat or a handicapped toilet? Yes   TIMED UP AND GO:  Was the test performed? No .      Cognitive Function:        12/16/2021   12:06 PM 12/11/2020    8:51 AM 12/12/2019    3:58 PM 10/03/2018   11:39 AM  6CIT Screen  What Year? 0 points 0 points 0 points 0 points  What month? 0 points 0 points 0 points 0 points  What time? 0 points 0 points 0 points 0 points  Count back from 20 0 points 0 points 0 points 0 points  Months in reverse 0 points 0 points 0 points 0 points  Repeat phrase 0 points 2 points 2 points 0 points  Total Score 0 points 2 points 2 points 0 points    Immunizations Immunization History  Administered Date(s) Administered   Fluad Quad(high Dose 65+) 09/29/2018, 09/27/2019, 09/29/2020, 09/22/2021   Influenza-Unspecified 09/29/2018   PFIZER(Purple Top)SARS-COV-2 Vaccination 02/04/2019, 02/25/2019, 10/12/2019, 04/30/2020   Pfizer Covid-19 Vaccine Bivalent Booster 5y-11y 10/14/2020   Pneumococcal Conjugate-13 10/03/2017   Pneumococcal Polysaccharide-23 08/13/2013, 04/20/2021   Tdap 01/23/2021   Zoster Recombinat (Shingrix) 02/20/2018, 07/17/2018    TDAP status: Up to  date  Flu Vaccine status: Up to date  Pneumococcal vaccine status: Up to date  Covid-19 vaccine status: Completed  vaccines  Qualifies for Shingles Vaccine? Yes   Zostavax completed Yes   Shingrix Completed?: Yes  Screening Tests Health Maintenance  Topic Date Due   COVID-19 Vaccine (6 - 2023-24 season) 09/04/2021   Medicare Annual Wellness (AWV)  12/11/2021   OPHTHALMOLOGY EXAM  01/14/2022   MAMMOGRAM  02/23/2022   Diabetic kidney evaluation - Urine ACR  04/21/2022   HEMOGLOBIN A1C  06/10/2022   FOOT EXAM  08/26/2022   Diabetic kidney evaluation - eGFR measurement  10/11/2022   COLONOSCOPY (Pts 45-53yr Insurance coverage will need to be confirmed)  03/24/2027   DTaP/Tdap/Td (2 - Td or Tdap) 01/24/2031   Pneumonia Vaccine 72 Years old  Completed   INFLUENZA VACCINE  Completed   DEXA SCAN  Completed   Hepatitis C Screening  Completed   Zoster Vaccines- Shingrix  Completed   HPV VACCINES  Aged Out    Health Maintenance  Health Maintenance Due  Topic Date Due   COVID-19 Vaccine (6 - 2023-24 season) 09/04/2021   Medicare Annual Wellness (AWV)  12/11/2021    Colorectal cancer screening: Type of screening: Colonoscopy. Completed 03/23/2017. Repeat every 10 years  Mammogram status: Completed 02/23/2021. Repeat every year  Bone Density status: Completed 02/14/2019.   Lung Cancer Screening: (Low Dose CT Chest recommended if Age 72-80years, 30 pack-year currently smoking OR have quit w/in 15years.) does not qualify.   Lung Cancer Screening Referral: no  Additional Screening:  Hepatitis C Screening: does qualify; Completed 12/08/2012  Vision Screening: Recommended annual ophthalmology exams for early detection of glaucoma and other disorders of the eye. Is the patient up to date with their annual eye exam?  Yes  Who is the provider or what is the name of the office in which the patient attends [facility] exams[facility] If pt is not established with a provider, would they like to be referred to a provider to establish care? No .   Dental Screening: Recommended annual dental exams  for proper oral hygiene  Community Resource Referral / Chronic Care Management: CRR required this visit?  No   CCM required this visit?  No      Plan:     I have personally reviewed and noted the following in the patient's chart:   Medical and social history Use of alcohol, tobacco or illicit drugs  Current medications and supplements including opioid prescriptions. Patient is not currently taking opioid prescriptions. Functional ability and status Nutritional status Physical activity Advanced directives List of other physicians Hospitalizations, surgeries, and ER visits in previous 12 months Vitals Screenings to include cognitive, depression, and falls Referrals and appointments  In addition, I have reviewed and discussed with patient certain preventive protocols, quality metrics, and best practice recommendations. A written personalized care plan for preventive services as well as general preventive health recommendations were provided to patient.     NKellie Simmering LPN   [id number]  Nurse Notes: none  Due to this being a virtual visit, the after visit summary with patients personalized plan was offered to patient via mail or my-chart.  Patient would like to access on my-chart

## 2021-12-23 ENCOUNTER — Ambulatory Visit: Payer: Medicare Other

## 2022-01-26 DIAGNOSIS — H25813 Combined forms of age-related cataract, bilateral: Secondary | ICD-10-CM | POA: Diagnosis not present

## 2022-01-26 DIAGNOSIS — E119 Type 2 diabetes mellitus without complications: Secondary | ICD-10-CM | POA: Diagnosis not present

## 2022-01-26 LAB — HM DIABETES EYE EXAM

## 2022-02-02 ENCOUNTER — Other Ambulatory Visit: Payer: Self-pay | Admitting: Internal Medicine

## 2022-02-22 DIAGNOSIS — N183 Chronic kidney disease, stage 3 unspecified: Secondary | ICD-10-CM | POA: Diagnosis not present

## 2022-02-22 DIAGNOSIS — N189 Chronic kidney disease, unspecified: Secondary | ICD-10-CM | POA: Diagnosis not present

## 2022-02-22 DIAGNOSIS — E7800 Pure hypercholesterolemia, unspecified: Secondary | ICD-10-CM | POA: Diagnosis not present

## 2022-02-22 DIAGNOSIS — N390 Urinary tract infection, site not specified: Secondary | ICD-10-CM | POA: Diagnosis not present

## 2022-02-22 DIAGNOSIS — E1122 Type 2 diabetes mellitus with diabetic chronic kidney disease: Secondary | ICD-10-CM | POA: Diagnosis not present

## 2022-02-22 DIAGNOSIS — M109 Gout, unspecified: Secondary | ICD-10-CM | POA: Diagnosis not present

## 2022-02-22 DIAGNOSIS — I129 Hypertensive chronic kidney disease with stage 1 through stage 4 chronic kidney disease, or unspecified chronic kidney disease: Secondary | ICD-10-CM | POA: Diagnosis not present

## 2022-03-02 DIAGNOSIS — R92333 Mammographic heterogeneous density, bilateral breasts: Secondary | ICD-10-CM | POA: Diagnosis not present

## 2022-03-02 DIAGNOSIS — Z1231 Encounter for screening mammogram for malignant neoplasm of breast: Secondary | ICD-10-CM | POA: Diagnosis not present

## 2022-03-02 LAB — HM MAMMOGRAPHY

## 2022-04-01 ENCOUNTER — Other Ambulatory Visit: Payer: Self-pay | Admitting: Internal Medicine

## 2022-04-13 ENCOUNTER — Ambulatory Visit (INDEPENDENT_AMBULATORY_CARE_PROVIDER_SITE_OTHER): Payer: Medicare Other | Admitting: Internal Medicine

## 2022-04-13 ENCOUNTER — Encounter: Payer: Self-pay | Admitting: Internal Medicine

## 2022-04-13 ENCOUNTER — Other Ambulatory Visit: Payer: Self-pay

## 2022-04-13 VITALS — BP 128/60 | HR 77 | Temp 98.4°F | Ht 61.0 in | Wt 134.2 lb

## 2022-04-13 DIAGNOSIS — Z6825 Body mass index (BMI) 25.0-25.9, adult: Secondary | ICD-10-CM

## 2022-04-13 DIAGNOSIS — E1122 Type 2 diabetes mellitus with diabetic chronic kidney disease: Secondary | ICD-10-CM

## 2022-04-13 DIAGNOSIS — I129 Hypertensive chronic kidney disease with stage 1 through stage 4 chronic kidney disease, or unspecified chronic kidney disease: Secondary | ICD-10-CM | POA: Diagnosis not present

## 2022-04-13 DIAGNOSIS — N184 Chronic kidney disease, stage 4 (severe): Secondary | ICD-10-CM | POA: Diagnosis not present

## 2022-04-13 DIAGNOSIS — E663 Overweight: Secondary | ICD-10-CM | POA: Diagnosis not present

## 2022-04-13 DIAGNOSIS — E7800 Pure hypercholesterolemia, unspecified: Secondary | ICD-10-CM

## 2022-04-13 DIAGNOSIS — M1A3720 Chronic gout due to renal impairment, left ankle and foot, without tophus (tophi): Secondary | ICD-10-CM

## 2022-04-13 LAB — POCT URINALYSIS DIPSTICK
Bilirubin, UA: NEGATIVE
Blood, UA: NEGATIVE
Glucose, UA: POSITIVE — AB
Ketones, UA: NEGATIVE
Nitrite, UA: NEGATIVE
Protein, UA: NEGATIVE
Spec Grav, UA: 1.025 (ref 1.010–1.025)
Urobilinogen, UA: 0.2 E.U./dL
pH, UA: 5 (ref 5.0–8.0)

## 2022-04-13 MED ORDER — ALLOPURINOL 100 MG PO TABS: ORAL_TABLET | ORAL | 1 refills | Status: DC

## 2022-04-13 MED ORDER — SIMVASTATIN 20 MG PO TABS: 20.0000 mg | ORAL_TABLET | Freq: Every day | ORAL | 3 refills | Status: DC

## 2022-04-13 NOTE — Progress Notes (Signed)
I,Victoria T Hamilton,acting as a scribe for Gwynneth Aliment, MD.,have documented all relevant documentation on the behalf of Gwynneth Aliment, MD,as directed by  Gwynneth Aliment, MD while in the presence of Gwynneth Aliment, MD.    Subjective:     Patient ID: Holly Peters , female    DOB: 1950-02-17 , 72 y.o.   MRN: [id number]   Chief Complaint  Patient presents with   Diabetes   Hypertension   Hyperlipidemia    HPI  She reports for diabetes and blood pressure & cholesterol check today. She reports compliance with meds. She denies headaches, chest pain and shortness of breath.    Diabetes She presents for her follow-up diabetic visit. She has type 2 diabetes mellitus. Her disease course has been stable. There are no hypoglycemic associated symptoms. Pertinent negatives for diabetes include no blurred vision, no polydipsia, no polyphagia and no polyuria. There are no hypoglycemic complications. Risk factors for coronary artery disease include diabetes mellitus, dyslipidemia, hypertension, sedentary lifestyle and post-menopausal. Her weight is stable. She is following a diabetic diet. Her breakfast blood glucose is taken between 8-9 am. Her breakfast blood glucose range is generally 90-110 mg/dl. Eye exam is current.  Hypertension This is a chronic problem. The current episode started more than 1 year ago. The problem has been gradually improving since onset. The problem is controlled. Associated symptoms include neck pain. Pertinent negatives include no blurred vision.     Past Medical History:  Diagnosis Date   Diabetes mellitus without complication    Hyperlipemia    Hypertension      Family History  Problem Relation Age of Onset   Diabetes Mother    Healthy Father      Current Outpatient Medications:    dapagliflozin propanediol (FARXIGA) 5 MG TABS tablet, , Disp: , Rfl:    diltiazem (CARDIZEM CD) 240 MG 24 hr capsule, TAKE 1 CAPSULE DAILY, Disp: 90 capsule, Rfl: 3    FREESTYLE LITE test strip, USE TO CHECK BLOOD SUGAR ONCE DAILY, Disp: 100 strip, Rfl: 3   MICARDIS HCT 40-12.5 MG tablet, TAKE 1 TABLET DAILY, Disp: 90 tablet, Rfl: 3   Psyllium (METAMUCIL FIBER PO), Take by mouth. prn, Disp: , Rfl:    TRULICITY 0.75 MG/0.5ML SOPN, INJECT 0.75 MG UNDER THE SKIN ONCE A WEEK, Disp: 6 mL, Rfl: 3   allopurinol (ZYLOPRIM) 100 MG tablet, TAKE 1 TABLET(100 MG) BY MOUTH DAILY, Disp: 90 tablet, Rfl: 1   simvastatin (ZOCOR) 20 MG tablet, Take 1 tablet (20 mg total) by mouth daily., Disp: 90 tablet, Rfl: 3   No Known Allergies   Review of Systems  Constitutional: Negative.   Eyes:  Negative for blurred vision.  Respiratory: Negative.    Cardiovascular: Negative.   Endocrine: Negative for polydipsia, polyphagia and polyuria.  Musculoskeletal:  Positive for neck pain.  Neurological: Negative.   Psychiatric/Behavioral: Negative.       Today's Vitals   04/13/22 0840  BP: 128/60  Pulse: 77  Temp: 98.4 F (36.9 C)  SpO2: 98%  Weight: 134 lb 3.2 oz (60.9 kg)  Height: 5\' 1"  (1.549 m)   Body mass index is 25.36 kg/m.  Wt Readings from Last 3 Encounters:  04/13/22 134 lb 3.2 oz (60.9 kg)  12/16/21 130 lb (59 kg)  12/09/21 131 lb (59.4 kg)   h Objective:  Physical Exam Vitals and nursing note reviewed.  Constitutional:      Appearance: Normal appearance.  HENT:  Head: Normocephalic and atraumatic.     Nose:     Comments: Masked     Mouth/Throat:     Comments: Masked  Eyes:     Extraocular Movements: Extraocular movements intact.  Cardiovascular:     Rate and Rhythm: Normal rate and regular rhythm.     Heart sounds: Normal heart sounds.  Pulmonary:     Effort: Pulmonary effort is normal.     Breath sounds: Normal breath sounds.  Musculoskeletal:     Cervical back: Normal range of motion.  Skin:    General: Skin is warm.  Neurological:     General: No focal deficit present.     Mental Status: She is alert.  Psychiatric:        Mood and  Affect: Mood normal.        Behavior: Behavior normal.      Assessment And Plan:     1. Type 2 diabetes mellitus with stage 4 chronic kidney disease, without long-term current use of insulin Comments: Chronic, she will c/w Trulicity 0.75mg  weekly and Farxiga 5mg  daily. She is reminded to avoid NSAIDS and stay wel hydrated. F/u 4 months. - CMP14+EGFR - Hemoglobin A1c - Lipid panel - POCT Urinalysis Dipstick (81002) - Microalbumin / creatinine urine ratio  2. Parenchymal renal hypertension, stage 1 through stage 4 or unspecified chronic kidney disease Comments: Chronic, controlled. She will c/w Micardis/HCT 40/12.5mg  and diltiazem CD 240mg  daily. She is encouraged to follow a low sodium diet. - CMP14+EGFR - Lipid panel - Microalbumin / creatinine urine ratio  3. Pure hypercholesterolemia Comments: Chronic, LDL goal < 70. She will c/w simvastatin.  She is encouraged to follow a heart healthy lifestyle. - Lipid panel  4. 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 She was given refill of allopurinol. - Uric acid  5. Overweight with body mass index (BMI) of 25 to 25.9 in adult Comments: She is encouraged to aim for at least 150 minutes of exercise/week.   Patient was given opportunity to ask questions. Patient verbalized understanding of the plan and was able to repeat key elements of the plan. All questions were answered to their satisfaction.   I, Gwynneth Aliment, MD, have reviewed all documentation for this visit. The documentation on 04/13/22 for the exam, diagnosis, procedures, and orders are all accurate and complete.   IF YOU HAVE BEEN REFERRED TO A SPECIALIST, IT MAY TAKE 1-2 WEEKS TO SCHEDULE/PROCESS THE REFERRAL. IF YOU HAVE NOT HEARD FROM US/SPECIALIST IN TWO WEEKS, PLEASE GIVE [place] A CALL AT [phone] X [phone].   THE PATIENT IS ENCOURAGED TO PRACTICE SOCIAL DISTANCING DUE TO THE  COVID-19 PANDEMIC.

## 2022-04-13 NOTE — Patient Instructions (Signed)

## 2022-04-14 LAB — CMP14+EGFR
ALT: 17 IU/L (ref 0–32)
AST: 17 IU/L (ref 0–40)
Albumin/Globulin Ratio: 2 (ref 1.2–2.2)
Albumin: 4.7 g/dL (ref 3.8–4.8)
Alkaline Phosphatase: 57 IU/L (ref 44–121)
BUN/Creatinine Ratio: 15 (ref 12–28)
BUN: 39 mg/dL — ABNORMAL HIGH (ref 8–27)
Bilirubin Total: 0.3 mg/dL (ref 0.0–1.2)
CO2: 18 mmol/L — ABNORMAL LOW (ref 20–29)
Calcium: 9.6 mg/dL (ref 8.7–10.3)
Chloride: 105 mmol/L (ref 96–106)
Creatinine, Ser: 2.66 mg/dL — ABNORMAL HIGH (ref 0.57–1.00)
Globulin, Total: 2.4 g/dL (ref 1.5–4.5)
Glucose: 107 mg/dL — ABNORMAL HIGH (ref 70–99)
Potassium: 4.2 mmol/L (ref 3.5–5.2)
Sodium: 141 mmol/L (ref 134–144)
Total Protein: 7.1 g/dL (ref 6.0–8.5)
eGFR: 18 mL/min/{1.73_m2} — ABNORMAL LOW (ref >59)

## 2022-04-14 LAB — LIPID PANEL
Chol/HDL Ratio: 2.7 ratio (ref 0.0–4.4)
Cholesterol, Total: 170 mg/dL (ref 100–199)
HDL: 62 mg/dL (ref >39)
LDL Chol Calc (NIH): 79 mg/dL (ref 0–99)
Triglycerides: 170 mg/dL — ABNORMAL HIGH (ref 0–149)
VLDL Cholesterol Cal: 29 mg/dL (ref 5–40)

## 2022-04-14 LAB — URIC ACID: Uric Acid: 4.7 mg/dL (ref 3.1–7.9)

## 2022-04-14 LAB — MICROALBUMIN / CREATININE URINE RATIO
Creatinine, Urine: 129.7 mg/dL
Microalb/Creat Ratio: 26 mg/g creat (ref 0–29)
Microalbumin, Urine: 34.1 ug/mL

## 2022-04-14 LAB — HEMOGLOBIN A1C
Est. average glucose Bld gHb Est-mCnc: 140 mg/dL
Hgb A1c MFr Bld: 6.5 % — ABNORMAL HIGH (ref 4.8–5.6)

## 2022-04-19 ENCOUNTER — Encounter: Payer: Self-pay | Admitting: *Deleted

## 2022-06-17 ENCOUNTER — Other Ambulatory Visit: Payer: Self-pay | Admitting: Internal Medicine

## 2022-06-28 DIAGNOSIS — I129 Hypertensive chronic kidney disease with stage 1 through stage 4 chronic kidney disease, or unspecified chronic kidney disease: Secondary | ICD-10-CM | POA: Diagnosis not present

## 2022-06-28 DIAGNOSIS — E1122 Type 2 diabetes mellitus with diabetic chronic kidney disease: Secondary | ICD-10-CM | POA: Diagnosis not present

## 2022-06-28 DIAGNOSIS — M109 Gout, unspecified: Secondary | ICD-10-CM | POA: Diagnosis not present

## 2022-06-28 DIAGNOSIS — E7800 Pure hypercholesterolemia, unspecified: Secondary | ICD-10-CM | POA: Diagnosis not present

## 2022-06-28 DIAGNOSIS — N183 Chronic kidney disease, stage 3 unspecified: Secondary | ICD-10-CM | POA: Diagnosis not present

## 2022-06-28 DIAGNOSIS — N189 Chronic kidney disease, unspecified: Secondary | ICD-10-CM | POA: Diagnosis not present

## 2022-06-29 LAB — LAB REPORT - SCANNED
Creatinine, POC: 144.8 mg/dL
EGFR: 16

## 2022-08-17 ENCOUNTER — Ambulatory Visit: Payer: Medicare Other | Admitting: Internal Medicine

## 2022-08-18 ENCOUNTER — Encounter: Payer: Self-pay | Admitting: Internal Medicine

## 2022-08-18 ENCOUNTER — Ambulatory Visit (INDEPENDENT_AMBULATORY_CARE_PROVIDER_SITE_OTHER): Payer: Medicare Other | Admitting: Internal Medicine

## 2022-08-18 VITALS — BP 124/70 | HR 62 | Temp 97.9°F | Ht 61.0 in | Wt 131.0 lb

## 2022-08-18 DIAGNOSIS — E2839 Other primary ovarian failure: Secondary | ICD-10-CM | POA: Diagnosis not present

## 2022-08-18 DIAGNOSIS — E7800 Pure hypercholesterolemia, unspecified: Secondary | ICD-10-CM | POA: Diagnosis not present

## 2022-08-18 DIAGNOSIS — Z6824 Body mass index (BMI) 24.0-24.9, adult: Secondary | ICD-10-CM | POA: Diagnosis not present

## 2022-08-18 DIAGNOSIS — N184 Chronic kidney disease, stage 4 (severe): Secondary | ICD-10-CM | POA: Diagnosis not present

## 2022-08-18 DIAGNOSIS — I129 Hypertensive chronic kidney disease with stage 1 through stage 4 chronic kidney disease, or unspecified chronic kidney disease: Secondary | ICD-10-CM | POA: Diagnosis not present

## 2022-08-18 DIAGNOSIS — E1122 Type 2 diabetes mellitus with diabetic chronic kidney disease: Secondary | ICD-10-CM

## 2022-08-18 MED ORDER — FREESTYLE LITE TEST VI STRP: ORAL_STRIP | 3 refills | Status: DC

## 2022-08-18 MED ORDER — ALLOPURINOL 100 MG PO TABS: ORAL_TABLET | ORAL | 2 refills | Status: DC

## 2022-08-18 NOTE — Assessment & Plan Note (Signed)
Chronic, due to underlying DM, LDL goal is less than 70.  She will c/w simvastatin 20mg  daily. She is encouraged to incorporate salmon/sardines into her diet.

## 2022-08-18 NOTE — Assessment & Plan Note (Signed)
Chronic, goal BP <120/80.  She will continue Micardis/hct 40/12.5mg  daily and diltiazem 240mg  daily. She is encouraged to follow a low sodium diet.

## 2022-08-18 NOTE — Assessment & Plan Note (Signed)
She is encouraged to walk 30 minutes five days weekly.

## 2022-08-18 NOTE — Assessment & Plan Note (Signed)
Chronic, currently on Trulicity 0.75mg  weekly. She is encouraged to optimize her protein intake and to incorporate strength training into her workout routine to avoid muscle loss. She will f/u in four months for re-evaluation. She is UTD with her eye exam.

## 2022-08-18 NOTE — Patient Instructions (Signed)

## 2022-08-18 NOTE — Progress Notes (Signed)
I,Victoria T Deloria Lair, CMA,acting as a Neurosurgeon for Gwynneth Aliment, MD.,have documented all relevant documentation on the behalf of Gwynneth Aliment, MD,as directed by  Gwynneth Aliment, MD while in the presence of Gwynneth Aliment, MD.  Subjective:  Patient ID: Holly Peters , female    DOB: [birth year] , 72 y.o.   MRN: [id number]  Chief Complaint  Patient presents with   Diabetes   Hypertension    HPI  She reports for diabetes and blood pressure & cholesterol check today. She reports compliance with meds. She denies headaches, chest pain and shortness of breath. She reports no specific questions or concerns.    Diabetes She presents for her follow-up diabetic visit. She has type 2 diabetes mellitus. Her disease course has been stable. There are no hypoglycemic associated symptoms. Pertinent negatives for diabetes include no blurred vision, no polydipsia, no polyphagia and no polyuria. There are no hypoglycemic complications. Risk factors for coronary artery disease include diabetes mellitus, dyslipidemia, hypertension, sedentary lifestyle and post-menopausal. Her weight is stable. She is following a diabetic diet. Her breakfast blood glucose is taken between 8-9 am. Her breakfast blood glucose range is generally 90-110 mg/dl. Eye exam is current.  Hypertension This is a chronic problem. The current episode started more than 1 year ago. The problem has been gradually improving since onset. The problem is controlled. Pertinent negatives include no blurred vision.     Past Medical History:  Diagnosis Date   Diabetes mellitus without complication (HCC)    Hyperlipemia    Hypertension      Family History  Problem Relation Age of Onset   Diabetes Mother    Healthy Father      Current Outpatient Medications:    dapagliflozin propanediol (FARXIGA) 5 MG TABS tablet, , Disp: , Rfl:    diltiazem (CARDIZEM CD) 240 MG 24 hr capsule, TAKE 1 CAPSULE DAILY, Disp: 90 capsule, Rfl: 3   MICARDIS  HCT 40-12.5 MG tablet, TAKE 1 TABLET DAILY, Disp: 90 tablet, Rfl: 3   Psyllium (METAMUCIL FIBER PO), Take by mouth. prn, Disp: , Rfl:    simvastatin (ZOCOR) 20 MG tablet, Take 1 tablet (20 mg total) by mouth daily., Disp: 90 tablet, Rfl: 3   TRULICITY 0.75 MG/0.5ML SOPN, INJECT 0.75 MG UNDER THE SKIN ONCE A WEEK, Disp: 6 mL, Rfl: 3   allopurinol (ZYLOPRIM) 100 MG tablet, TAKE 1 TABLET(100 MG) BY MOUTH DAILY, Disp: 90 tablet, Rfl: 2   glucose blood (FREESTYLE LITE) test strip, USE TO CHECK BLOOD SUGAR ONCE DAILY, Disp: 100 strip, Rfl: 3   No Known Allergies   Review of Systems  Constitutional: Negative.   HENT: Negative.    Eyes:  Negative for blurred vision.  Respiratory: Negative.    Cardiovascular: Negative.   Gastrointestinal: Negative.   Endocrine: Negative for polydipsia, polyphagia and polyuria.  Neurological: Negative.   Psychiatric/Behavioral: Negative.       Today's Vitals   08/18/22 0903  BP: 124/70  Pulse: 62  Temp: 97.9 F (36.6 C)  SpO2: 98%  Weight: 131 lb (59.4 kg)  Height: 5\' 1"  (1.549 m)  PainSc: 0-No pain   Body mass index is 24.75 kg/m.  Wt Readings from Last 3 Encounters:  08/18/22 131 lb (59.4 kg)  04/13/22 134 lb 3.2 oz (60.9 kg)  12/16/21 130 lb (59 kg)     Objective:  Physical Exam Vitals and nursing note reviewed.  Constitutional:      Appearance: Normal appearance.  HENT:  Head: Normocephalic and atraumatic.  Eyes:     Extraocular Movements: Extraocular movements intact.  Cardiovascular:     Rate and Rhythm: Normal rate and regular rhythm.     Heart sounds: Normal heart sounds.  Pulmonary:     Effort: Pulmonary effort is normal.     Breath sounds: Normal breath sounds.  Musculoskeletal:     Cervical back: Normal range of motion.  Skin:    General: Skin is warm.  Neurological:     General: No focal deficit present.     Mental Status: She is alert.  Psychiatric:        Mood and Affect: Mood normal.        Behavior: Behavior  normal.         Assessment And Plan:  Type 2 diabetes mellitus with stage 4 chronic kidney disease, without long-term current use of insulin (HCC) Assessment & Plan: Chronic, currently on Trulicity 0.75mg  weekly. She is encouraged to optimize her protein intake and to incorporate strength training into her workout routine to avoid muscle loss. She will f/u in four months for re-evaluation. She is UTD with her eye exam.   Orders: -     Hemoglobin A1c -     PTH, intact and calcium -     Phosphorus -     Protein electrophoresis, serum  Parenchymal renal hypertension, stage 1 through stage 4 or unspecified chronic kidney disease Assessment & Plan: Chronic, goal BP <120/80.  She will continue Micardis/hct 40/12.5mg  daily and diltiazem 240mg  daily. She is encouraged to follow a low sodium diet.     Pure hypercholesterolemia Assessment & Plan: Chronic, due to underlying DM, LDL goal is less than 70.  She will c/w simvastatin 20mg  daily. She is encouraged to incorporate salmon/sardines into her diet.   Orders: -     TSH  Estrogen deficiency -     DG Bone Density; Future  Body mass index (BMI) of 24.0-24.9 in adult Assessment & Plan: She is encouraged to walk 30 minutes five days weekly.    Other orders -     Allopurinol; TAKE 1 TABLET(100 MG) BY MOUTH DAILY  Dispense: 90 tablet; Refill: 2 -     FreeStyle Lite Test; USE TO CHECK BLOOD SUGAR ONCE DAILY  Dispense: 100 strip; Refill: 3     Return for 4 month dm .  Patient was given opportunity to ask questions. Patient verbalized understanding of the plan and was able to repeat key elements of the plan. All questions were answered to their satisfaction.   I, Gwynneth Aliment, MD, have reviewed all documentation for this visit. The documentation on 08/18/22 for the exam, diagnosis, procedures, and orders are all accurate and complete.   IF YOU HAVE BEEN REFERRED TO A SPECIALIST, IT MAY TAKE 1-2 WEEKS TO SCHEDULE/PROCESS THE  REFERRAL. IF YOU HAVE NOT HEARD FROM US/SPECIALIST IN TWO WEEKS, PLEASE GIVE [place] A CALL AT [phone].   THE PATIENT IS ENCOURAGED TO PRACTICE SOCIAL DISTANCING DUE TO THE COVID-19 PANDEMIC.

## 2022-08-20 LAB — PROTEIN ELECTROPHORESIS, SERUM
A/G Ratio: 1.4 (ref 0.7–1.7)
Albumin ELP: 4.2 g/dL (ref 2.9–4.4)
Alpha 1: 0.2 g/dL (ref 0.0–0.4)
Alpha 2: 0.7 g/dL (ref 0.4–1.0)
Beta: 0.9 g/dL (ref 0.7–1.3)
Gamma Globulin: 1.1 g/dL (ref 0.4–1.8)
Globulin, Total: 3 g/dL (ref 2.2–3.9)
Total Protein: 7.2 g/dL (ref 6.0–8.5)

## 2022-08-20 LAB — PTH, INTACT AND CALCIUM
Calcium: 10.3 mg/dL (ref 8.7–10.3)
PTH: 83 pg/mL — ABNORMAL HIGH (ref 15–65)

## 2022-08-20 LAB — TSH: TSH: 1.16 u[IU]/mL (ref 0.450–4.500)

## 2022-08-20 LAB — HEMOGLOBIN A1C
Est. average glucose Bld gHb Est-mCnc: 140 mg/dL
Hgb A1c MFr Bld: 6.5 % — ABNORMAL HIGH (ref 4.8–5.6)

## 2022-08-20 LAB — PHOSPHORUS: Phosphorus: 3.4 mg/dL (ref 3.0–4.3)

## 2022-08-30 DIAGNOSIS — M85851 Other specified disorders of bone density and structure, right thigh: Secondary | ICD-10-CM | POA: Diagnosis not present

## 2022-08-30 DIAGNOSIS — E2839 Other primary ovarian failure: Secondary | ICD-10-CM | POA: Diagnosis not present

## 2022-08-30 LAB — HM DEXA SCAN

## 2022-09-20 DIAGNOSIS — Z23 Encounter for immunization: Secondary | ICD-10-CM | POA: Diagnosis not present

## 2022-10-07 ENCOUNTER — Other Ambulatory Visit: Payer: Self-pay | Admitting: Internal Medicine

## 2022-11-02 DIAGNOSIS — I129 Hypertensive chronic kidney disease with stage 1 through stage 4 chronic kidney disease, or unspecified chronic kidney disease: Secondary | ICD-10-CM | POA: Diagnosis not present

## 2022-11-02 DIAGNOSIS — E7800 Pure hypercholesterolemia, unspecified: Secondary | ICD-10-CM | POA: Diagnosis not present

## 2022-11-02 DIAGNOSIS — N183 Chronic kidney disease, stage 3 unspecified: Secondary | ICD-10-CM | POA: Diagnosis not present

## 2022-11-02 DIAGNOSIS — E1129 Type 2 diabetes mellitus with other diabetic kidney complication: Secondary | ICD-10-CM | POA: Diagnosis not present

## 2022-11-02 DIAGNOSIS — E1122 Type 2 diabetes mellitus with diabetic chronic kidney disease: Secondary | ICD-10-CM | POA: Diagnosis not present

## 2022-11-02 DIAGNOSIS — M109 Gout, unspecified: Secondary | ICD-10-CM | POA: Diagnosis not present

## 2022-11-03 LAB — LAB REPORT - SCANNED: EGFR: 21

## 2022-12-20 ENCOUNTER — Ambulatory Visit: Payer: Medicare Other | Admitting: Internal Medicine

## 2022-12-27 ENCOUNTER — Ambulatory Visit: Payer: Medicare Other | Admitting: Family Medicine

## 2023-01-13 ENCOUNTER — Ambulatory Visit (INDEPENDENT_AMBULATORY_CARE_PROVIDER_SITE_OTHER): Payer: Medicare Other | Admitting: Internal Medicine

## 2023-01-13 ENCOUNTER — Encounter: Payer: Self-pay | Admitting: Internal Medicine

## 2023-01-13 VITALS — BP 110/70 | HR 78 | Temp 98.4°F | Wt 131.6 lb

## 2023-01-13 DIAGNOSIS — E7800 Pure hypercholesterolemia, unspecified: Secondary | ICD-10-CM | POA: Diagnosis not present

## 2023-01-13 DIAGNOSIS — E1122 Type 2 diabetes mellitus with diabetic chronic kidney disease: Secondary | ICD-10-CM | POA: Diagnosis not present

## 2023-01-13 DIAGNOSIS — M1A3720 Chronic gout due to renal impairment, left ankle and foot, without tophus (tophi): Secondary | ICD-10-CM | POA: Diagnosis not present

## 2023-01-13 DIAGNOSIS — Z79899 Other long term (current) drug therapy: Secondary | ICD-10-CM | POA: Diagnosis not present

## 2023-01-13 DIAGNOSIS — I129 Hypertensive chronic kidney disease with stage 1 through stage 4 chronic kidney disease, or unspecified chronic kidney disease: Secondary | ICD-10-CM

## 2023-01-13 DIAGNOSIS — N184 Chronic kidney disease, stage 4 (severe): Secondary | ICD-10-CM | POA: Diagnosis not present

## 2023-01-13 MED ORDER — SIMVASTATIN 20 MG PO TABS: 20.0000 mg | ORAL_TABLET | Freq: Every day | ORAL | 3 refills | Status: DC

## 2023-01-13 MED ORDER — DILTIAZEM HCL ER COATED BEADS 240 MG PO CP24: 240.0000 mg | ORAL_CAPSULE | Freq: Every day | ORAL | 3 refills | Status: DC

## 2023-01-13 MED ORDER — MICARDIS HCT 40-12.5 MG PO TABS: 1.0000 | ORAL_TABLET | Freq: Every day | ORAL | 3 refills | Status: DC

## 2023-01-13 NOTE — Assessment & Plan Note (Signed)
 Chronic, well controlled.  She will continue Micardis/hct 40/12.5mg  daily and diltiazem 240mg  daily. She is encouraged to follow a low sodium diet.

## 2023-01-13 NOTE — Assessment & Plan Note (Signed)
 Chronic, sx have been stable while on allopurinol. Will check uric acid level today.

## 2023-01-13 NOTE — Assessment & Plan Note (Signed)
 Chronic, due to underlying DM, LDL goal is less than 70.  She will c/w simvastatin  20mg  daily for now. However, we discussed possibility of undesirable side effects including liver damage and muscle issues including rhabdomyolysis. I do plan to switch her to a different statin in the near future.

## 2023-01-13 NOTE — Patient Instructions (Signed)

## 2023-01-13 NOTE — Assessment & Plan Note (Signed)
 Chronic, currently on Trulicity  0.75mg  weekly and Farxiga 5mg  daily. She is encouraged to optimize her protein intake and to incorporate strength training into her workout routine to avoid muscle loss. She will f/u in four months for re-evaluation. She is UTD with her eye exam. CKD is chronic, she is encouraged to stay well hydrated, avoid NSAIDs and keep BP controlled to prevent progression of CKD.  She is also followed by Nephrology. She will rto in 4 months.

## 2023-01-13 NOTE — Progress Notes (Signed)
 I,Jameka J Llittleton, CMA,acting as a neurosurgeon for [name], MD.,have documented all relevant documentation on the behalf of [name], MD,as directed by  [name], MD while in the presence of [name], MD.  Subjective:  Patient ID: Holly Peters , female    DOB: Nov 19, 1950 , 73 y.o.   MRN: [id number]  Chief Complaint  Patient presents with   Diabetes   Hypertension    HPI  She reports for diabetes and blood pressure & cholesterol check today. She reports compliance with meds. She denies headaches, chest pain and shortness of breath. She reports no specific questions or concerns.    Diabetes She presents for her follow-up diabetic visit. She has type 2 diabetes mellitus. Her disease course has been stable. There are no hypoglycemic associated symptoms. Pertinent negatives for diabetes include no blurred vision, no polydipsia, no polyphagia and no polyuria. There are no hypoglycemic complications. Risk factors for coronary artery disease include diabetes mellitus, dyslipidemia, hypertension, sedentary lifestyle and post-menopausal. Her weight is stable. She is following a diabetic diet. Her breakfast blood glucose is taken between 8-9 am. Her breakfast blood glucose range is generally 90-110 mg/dl. Eye exam is current.  Hypertension This is a chronic problem. The current episode started more than 1 year ago. The problem has been gradually improving since onset. The problem is controlled. Pertinent negatives include no blurred vision.     Past Medical History:  Diagnosis Date   Diabetes mellitus without complication (HCC)    Hyperlipemia    Hypertension      Family History  Problem Relation Age of Onset   Diabetes Mother    Healthy Father      Current Outpatient Medications:    allopurinol  (ZYLOPRIM ) 100 MG tablet, TAKE 1 TABLET(100 MG) BY MOUTH DAILY, Disp: 90 tablet, Rfl: 2   dapagliflozin propanediol (FARXIGA) 5 MG TABS tablet, , Disp: , Rfl:     glucose blood (FREESTYLE LITE) test strip, USE TO CHECK BLOOD SUGAR ONCE DAILY, Disp: 100 strip, Rfl: 3   Psyllium (METAMUCIL FIBER PO), Take by mouth. prn, Disp: , Rfl:    TRULICITY  0.75 MG/0.5ML SOPN, INJECT 0.75 MG UNDER THE SKIN ONCE A WEEK, Disp: 6 mL, Rfl: 3   diltiazem  (CARDIZEM  CD) 240 MG 24 hr capsule, Take 1 capsule (240 mg total) by mouth daily., Disp: 90 capsule, Rfl: 3   MICARDIS  HCT 40-12.5 MG tablet, Take 1 tablet by mouth daily., Disp: 90 tablet, Rfl: 3   simvastatin  (ZOCOR ) 20 MG tablet, Take 1 tablet (20 mg total) by mouth daily., Disp: 90 tablet, Rfl: 3   No Known Allergies   Review of Systems  Constitutional: Negative.   Eyes: Negative.  Negative for blurred vision.  Endocrine: Negative for polydipsia, polyphagia and polyuria.  Musculoskeletal: Negative.   Skin: Negative.   Psychiatric/Behavioral: Negative.       Today's Vitals   01/13/23 0827  BP: 110/70  Pulse: 78  Temp: 98.4 F (36.9 C)  Weight: 131 lb 9.6 oz (59.7 kg)  PainSc: 0-No pain   Body mass index is 24.87 kg/m.  Wt Readings from Last 3 Encounters:  01/13/23 131 lb 9.6 oz (59.7 kg)  08/18/22 131 lb (59.4 kg)  04/13/22 134 lb 3.2 oz (60.9 kg)    The 10-year ASCVD risk score (Arnett DK, et al., 2019) is: 21.8%   Values used to calculate the score:     Age: 12 years     Sex: Female  Is Non-Hispanic African American: Yes     Diabetic: Yes     Tobacco smoker: No     Systolic Blood Pressure: 110 mmHg     Is BP treated: Yes     HDL Cholesterol: 62 mg/dL     Total Cholesterol: 170 mg/dL  Objective:  Physical Exam Vitals and nursing note reviewed.  Constitutional:      Appearance: Normal appearance.  HENT:     Head: Normocephalic and atraumatic.  Eyes:     Extraocular Movements: Extraocular movements intact.  Cardiovascular:     Rate and Rhythm: Normal rate and regular rhythm.     Heart sounds: Normal heart sounds.  Pulmonary:     Effort: Pulmonary effort is normal.     Breath  sounds: Normal breath sounds.  Musculoskeletal:     Cervical back: Normal range of motion.  Skin:    General: Skin is warm.  Neurological:     General: No focal deficit present.     Mental Status: She is alert.  Psychiatric:        Mood and Affect: Mood normal.        Behavior: Behavior normal.         Assessment And Plan:  Type 2 diabetes mellitus with stage 4 chronic kidney disease, without long-term current use of insulin (HCC) Assessment & Plan: Chronic, currently on Trulicity  0.75mg  weekly and Farxiga 5mg  daily. She is encouraged to optimize her protein intake and to incorporate strength training into her workout routine to avoid muscle loss. She will f/u in four months for re-evaluation. She is UTD with her eye exam. CKD is chronic, she is encouraged to stay well hydrated, avoid NSAIDs and keep BP controlled to prevent progression of CKD.  She is also followed by Nephrology. She will rto in 4 months.   Orders: -     CBC -     CMP14+EGFR -     Hemoglobin A1c -     Lipid panel -     Uric acid -     PTH, intact and calcium -     Phosphorus -     Protein electrophoresis, serum  Parenchymal renal hypertension, stage 1 through stage 4 or unspecified chronic kidney disease Assessment & Plan: Chronic, well controlled.  She will continue Micardis /hct 40/12.5mg  daily and diltiazem  240mg  daily. She is encouraged to follow a low sodium diet.    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 Will check uric acid level today.   Orders: -     Uric acid  Pure hypercholesterolemia Assessment & Plan: Chronic, due to underlying DM, LDL goal is less than 70.  She will c/w simvastatin  20mg  daily for now. However, we discussed possibility of undesirable side effects including liver damage and muscle issues including rhabdomyolysis. I do plan  to switch her to a different statin in the near future.   Orders: -     Lipid panel  Polypharmacy -     CK  Other orders -     dilTIAZem  HCl ER Coated Beads; Take 1 capsule (240 mg total) by mouth daily.  Dispense: 90 capsule; Refill: 3 -     Micardis  HCT; Take 1 tablet by mouth daily.  Dispense: 90 tablet; Refill: 3 -     Simvastatin ; Take 1  tablet (20 mg total) by mouth daily.  Dispense: 90 tablet; Refill: 3    Return for DM check (workup as physical)-40 minutes(ekg,urine).  Patient was given opportunity to ask questions. Patient verbalized understanding of the plan and was able to repeat key elements of the plan. All questions were answered to their satisfaction.    I, [name], MD, have reviewed all documentation for this visit. The documentation on 01/13/23 for the exam, diagnosis, procedures, and orders are all accurate and complete.   IF YOU HAVE BEEN REFERRED TO A SPECIALIST, IT MAY TAKE 1-2 WEEKS TO SCHEDULE/PROCESS THE REFERRAL. IF YOU HAVE NOT HEARD FROM US /SPECIALIST IN TWO WEEKS, PLEASE GIVE US  A CALL AT [phone].

## 2023-01-17 LAB — CK: Total CK: 98 U/L (ref 32–182)

## 2023-01-17 LAB — PROTEIN ELECTROPHORESIS, SERUM
A/G Ratio: 1.3 (ref 0.7–1.7)
Albumin ELP: 4.1 g/dL (ref 2.9–4.4)
Alpha 1: 0.2 g/dL (ref 0.0–0.4)
Alpha 2: 0.8 g/dL (ref 0.4–1.0)
Beta: 1 g/dL (ref 0.7–1.3)
Gamma Globulin: 1.2 g/dL (ref 0.4–1.8)
Globulin, Total: 3.1 g/dL (ref 2.2–3.9)

## 2023-01-17 LAB — CBC
Hematocrit: 39.6 % (ref 34.0–46.6)
Hemoglobin: 12.5 g/dL (ref 11.1–15.9)
MCH: 29.8 pg (ref 26.6–33.0)
MCHC: 31.6 g/dL (ref 31.5–35.7)
MCV: 94 fL (ref 79–97)
Platelets: 298 10*3/uL (ref 150–450)
RBC: 4.2 x10E6/uL (ref 3.77–5.28)
RDW: 13.7 % (ref 11.7–15.4)
WBC: 9.5 10*3/uL (ref 3.4–10.8)

## 2023-01-17 LAB — LIPID PANEL
Chol/HDL Ratio: 2.8 {ratio} (ref 0.0–4.4)
Cholesterol, Total: 160 mg/dL (ref 100–199)
HDL: 57 mg/dL (ref >39)
LDL Chol Calc (NIH): 76 mg/dL (ref 0–99)
Triglycerides: 157 mg/dL — ABNORMAL HIGH (ref 0–149)
VLDL Cholesterol Cal: 27 mg/dL (ref 5–40)

## 2023-01-17 LAB — CMP14+EGFR
ALT: 14 [IU]/L (ref 0–32)
AST: 16 [IU]/L (ref 0–40)
Albumin: 4.6 g/dL (ref 3.8–4.8)
Alkaline Phosphatase: 66 [IU]/L (ref 44–121)
BUN/Creatinine Ratio: 14 (ref 12–28)
BUN: 36 mg/dL — ABNORMAL HIGH (ref 8–27)
Bilirubin Total: 0.2 mg/dL (ref 0.0–1.2)
CO2: 18 mmol/L — ABNORMAL LOW (ref 20–29)
Calcium: 9.9 mg/dL (ref 8.7–10.3)
Chloride: 109 mmol/L — ABNORMAL HIGH (ref 96–106)
Creatinine, Ser: 2.56 mg/dL — ABNORMAL HIGH (ref 0.57–1.00)
Globulin, Total: 2.6 g/dL (ref 1.5–4.5)
Glucose: 115 mg/dL — ABNORMAL HIGH (ref 70–99)
Potassium: 4.9 mmol/L (ref 3.5–5.2)
Sodium: 145 mmol/L — ABNORMAL HIGH (ref 134–144)
Total Protein: 7.2 g/dL (ref 6.0–8.5)
eGFR: 19 mL/min/{1.73_m2} — ABNORMAL LOW (ref >59)

## 2023-01-17 LAB — URIC ACID: Uric Acid: 4.7 mg/dL (ref 3.1–7.9)

## 2023-01-17 LAB — PTH, INTACT AND CALCIUM: PTH: 126 pg/mL — ABNORMAL HIGH (ref 15–65)

## 2023-01-17 LAB — HEMOGLOBIN A1C
Est. average glucose Bld gHb Est-mCnc: 140 mg/dL
Hgb A1c MFr Bld: 6.5 % — ABNORMAL HIGH (ref 4.8–5.6)

## 2023-01-17 LAB — PHOSPHORUS: Phosphorus: 3.8 mg/dL (ref 3.0–4.3)

## 2023-01-19 ENCOUNTER — Ambulatory Visit: Payer: Medicare Other

## 2023-01-19 DIAGNOSIS — Z0000 Encounter for general adult medical examination without abnormal findings: Secondary | ICD-10-CM | POA: Diagnosis not present

## 2023-01-19 NOTE — Progress Notes (Signed)
 Subjective:   Holly Peters is a 73 y.o. female who presents for Medicare Annual (Subsequent) preventive examination.  Visit Complete: Virtual I connected with  Holly Peters on 01/19/23 by a video and audio enabled telemedicine application and verified that I am speaking with the correct person using two identifiers.  Patient Location: Home  Provider Location: Office/Clinic  I discussed the limitations of evaluation and management by telemedicine. The patient expressed understanding and agreed to proceed.  Vital Signs: Because this visit was a virtual/telehealth visit, some criteria may be missing or patient reported. Any vitals not documented were not able to be obtained and vitals that have been documented are patient reported.  Patient Medicare AWV questionnaire was completed by the patient on 01/15/2023; I have confirmed that all information answered by patient is correct and no changes since this date.  Cardiac Risk Factors include: advanced age (>34men, >11 women);diabetes mellitus;hypertension     Objective:    Today's Vitals   There is no height or weight on file to calculate BMI.     01/19/2023   10:32 AM 12/16/2021   12:05 PM 12/11/2020    8:49 AM 09/10/2020    1:53 PM 12/12/2019    3:55 PM 10/03/2018   11:35 AM 11/14/2017    2:20 PM  Advanced Directives  Does Patient Have a Medical Advance Directive? Yes Yes Yes Yes Yes Yes No  Type of Estate agent of [place];Living will Healthcare Power of [place];Living will Healthcare Power of [place];Living will  Living will Healthcare Power of [place];Living will   Copy of Healthcare Power of Attorney in Chart? No - copy requested No - copy requested No - copy requested   No - copy requested     Current Medications (verified) Outpatient Encounter Medications as of 01/19/2023  Medication Sig   allopurinol  (ZYLOPRIM ) 100 MG tablet TAKE 1 TABLET(100 MG) BY MOUTH DAILY   dapagliflozin  propanediol (FARXIGA) 5 MG TABS tablet    diltiazem  (CARDIZEM  CD) 240 MG 24 hr capsule Take 1 capsule (240 mg total) by mouth daily.   glucose blood (FREESTYLE LITE) test strip USE TO CHECK BLOOD SUGAR ONCE DAILY   MICARDIS  HCT 40-12.5 MG tablet Take 1 tablet by mouth daily.   Psyllium (METAMUCIL FIBER PO) Take by mouth. prn   simvastatin  (ZOCOR ) 20 MG tablet Take 1 tablet (20 mg total) by mouth daily.   TRULICITY  0.75 MG/0.5ML SOPN INJECT 0.75 MG UNDER THE SKIN ONCE A WEEK   No facility-administered encounter medications on file as of 01/19/2023.    Allergies (verified) Patient has no known allergies.   History: Past Medical History:  Diagnosis Date   Chronic kidney disease    Diabetes mellitus without complication (HCC)    Hyperlipemia    Hypertension    Past Surgical History:  Procedure Laterality Date   ABDOMINAL HYSTERECTOMY     Family History  Problem Relation Age of Onset   Diabetes Mother    Healthy Father    Social History   Socioeconomic History   Marital status: Married    Spouse name: Not on file   Number of children: Not on file   Years of education: Not on file   Highest education level: 12th grade  Occupational History   Occupation: retired  Tobacco Use   Smoking status: Never   Smokeless tobacco: Never  Vaping Use   Vaping status: Never Used  Substance and Sexual Activity   Alcohol use: No   Drug  use: No   Sexual activity: Yes    Birth control/protection: Surgical  Other Topics Concern   Not on file  Social History Narrative   Not on file   Social Drivers of Health   Financial Resource Strain: Low Risk  (01/19/2023)   Overall Financial Resource Strain (CARDIA)    Difficulty of Paying Living Expenses: Not hard at all  Food Insecurity: No Food Insecurity (01/19/2023)   Hunger Vital Sign    Worried About Running Out of Food in the Last Year: Never true    Ran Out of Food in the Last Year: Never true  Transportation Needs: No Transportation  Needs (01/19/2023)   PRAPARE - Administrator, Civil Service (Medical): No    Lack of Transportation (Non-Medical): No  Physical Activity: Sufficiently Active (01/19/2023)   Exercise Vital Sign    Days of Exercise per Week: 3 days    Minutes of Exercise per Session: 50 min  Stress: No Stress Concern Present (01/19/2023)   [employer] of Occupational Health - Occupational Stress Questionnaire    Feeling of Stress : Not at all  Social Connections: Socially Integrated (01/19/2023)   Social Connection and Isolation Panel [NHANES]    Frequency of Communication with Friends and Family: More than three times a week    Frequency of Social Gatherings with Friends and Family: More than three times a week    Attends Religious Services: More than 4 times per year    Active Member of [employer] or Organizations: Yes    Attends Engineer, structural: More than 4 times per year    Marital Status: Married    Tobacco Counseling Counseling given: Not Answered   Clinical Intake:  Pre-visit preparation completed: Yes  Pain : No/denies pain     Nutritional Risks: Nausea/ vomitting/ diarrhea Diabetes: Yes CBG done?: No Did pt. bring in CBG monitor from home?: No  How often do you need to have someone help you when you read instructions, pamphlets, or other written materials from your doctor or pharmacy?: 1 - Never  Interpreter Needed?: No  Information entered by :: NAllen LPN   Activities of Daily Living    01/15/2023   12:29 PM  In your present state of health, do you have any difficulty performing the following activities:  Hearing? 0  Vision? 0  Difficulty concentrating or making decisions? 0  Walking or climbing stairs? 0  Dressing or bathing? 0  Doing errands, shopping? 0  Preparing Food and eating ? N  Using the Toilet? N  In the past six months, have you accidently leaked urine? N  Do you have problems with loss of bowel control? N  Managing your  Medications? N  Managing your Finances? N  Housekeeping or managing your Housekeeping? N    Patient Care Team: Cleave Curling, MD as PCP - General (Internal Medicine) Nathen Balder, Skeeter Dukes, RN as [facility] Care Management  Indicate any recent Medical Services you may have received from other than [facility] providers in the past year (date may be approximate).     Assessment:   This is a routine wellness examination for Holly Peters.  Hearing/Vision screen Hearing Screening - Comments:: Denies hearing issues Vision Screening - Comments:: Regular eye exams, [facility]   Goals Addressed             This Visit's Progress    Patient Stated       01/19/2023, stay healthy  Depression Screen    01/19/2023   10:33 AM 01/13/2023    8:28 AM 08/18/2022    9:03 AM 04/13/2022    8:36 AM 12/16/2021   12:05 PM 12/11/2020    8:50 AM 12/12/2019    3:56 PM  PHQ 2/9 Scores  PHQ - 2 Score 0 0 0 0 0 0 0  PHQ- 9 Score  0 0 0       Fall Risk    01/15/2023   12:29 PM 01/13/2023    8:28 AM 08/18/2022    9:02 AM 04/13/2022    8:36 AM 12/16/2021   12:05 PM  Fall Risk   Falls in the past year? 0 0 0 0 0  Number falls in past yr: 0 0 0 0 0  Injury with Fall? 0 0 0 0 0  Risk for fall due to : Medication side effect No Fall Risks No Fall Risks No Fall Risks Medication side effect  Follow up Falls prevention discussed;Falls evaluation completed Falls evaluation completed Falls evaluation completed Falls evaluation completed Falls prevention discussed;Education provided;Falls evaluation completed    MEDICARE RISK AT HOME: Medicare Risk at Home Any stairs in or around the home?: (Patient-Rptd) Yes If so, are there any without handrails?: (Patient-Rptd) Yes Home free of loose throw rugs in walkways, pet beds, electrical cords, etc?: (Patient-Rptd) Yes Adequate lighting in your home to reduce risk of falls?: (Patient-Rptd) Yes Life alert?: (Patient-Rptd) No Use of a cane, walker or w/c?:  (Patient-Rptd) No Grab bars in the bathroom?: (Patient-Rptd) Yes Shower chair or bench in shower?: (Patient-Rptd) Yes Elevated toilet seat or a handicapped toilet?: (Patient-Rptd) Yes  TIMED UP AND GO:  Was the test performed?  No    Cognitive Function:        01/19/2023   10:33 AM 12/16/2021   12:06 PM 12/11/2020    8:51 AM 12/12/2019    3:58 PM 10/03/2018   11:39 AM  6CIT Screen  What Year? 0 points 0 points 0 points 0 points 0 points  What month? 0 points 0 points 0 points 0 points 0 points  What time? 0 points 0 points 0 points 0 points 0 points  Count back from 20 0 points 0 points 0 points 0 points 0 points  Months in reverse 0 points 0 points 0 points 0 points 0 points  Repeat phrase 0 points 0 points 2 points 2 points 0 points  Total Score 0 points 0 points 2 points 2 points 0 points    Immunizations Immunization History  Administered Date(s) Administered   Fluad Quad(high Dose 65+) 09/29/2018, 09/27/2019, 09/29/2020, 09/22/2021   Influenza-Unspecified 09/29/2018   PFIZER(Purple Top)SARS-COV-2 Vaccination 02/04/2019, 02/25/2019, 10/12/2019, 04/30/2020   Pfizer Covid-19 Vaccine Bivalent Booster 5y-11y 10/14/2020   Pfizer(Comirnaty)Fall Seasonal Vaccine 12 years and older 09/20/2022   Pneumococcal Conjugate-13 10/03/2017   Pneumococcal Polysaccharide-23 08/13/2013, 04/20/2021   Tdap 01/23/2021   Zoster Recombinant(Shingrix) 02/20/2018, 07/17/2018    TDAP status: Up to date  Flu Vaccine status: Up to date  Pneumococcal vaccine status: Up to date  Covid-19 vaccine status: Completed vaccines  Qualifies for Shingles Vaccine? Yes   Zostavax completed Yes   Shingrix Completed?: Yes  Screening Tests Health Maintenance  Topic Date Due   FOOT EXAM  08/26/2022   COVID-19 Vaccine (7 - 2024-25 season) 11/15/2022   OPHTHALMOLOGY EXAM  01/27/2023   MAMMOGRAM  03/03/2023   Diabetic kidney evaluation - Urine ACR  04/13/2023   HEMOGLOBIN A1C  07/13/2023  Diabetic  kidney evaluation - eGFR measurement  01/13/2024   Medicare Annual Wellness (AWV)  01/19/2024   Colonoscopy  03/24/2027   DTaP/Tdap/Td (2 - Td or Tdap) 01/24/2031   Pneumonia Vaccine 33+ Years old  Completed   INFLUENZA VACCINE  Completed   DEXA SCAN  Completed   Hepatitis C Screening  Completed   Zoster Vaccines- Shingrix  Completed   HPV VACCINES  Aged Out    Health Maintenance  Health Maintenance Due  Topic Date Due   FOOT EXAM  08/26/2022   COVID-19 Vaccine (7 - 2024-25 season) 11/15/2022    Colorectal cancer screening: Type of screening: Colonoscopy. Completed 03/23/2017. Repeat every 10 years  Mammogram status: Completed 03/02/2022. Repeat every year  Bone Density status: Completed 02/14/2019.   Lung Cancer Screening: (Low Dose CT Chest recommended if Age 24-80 years, 20 pack-year currently smoking OR have quit w/in 15years.) does not qualify.   Lung Cancer Screening Referral: no  Additional Screening:  Hepatitis C Screening: does qualify; Completed 12/08/2012  Vision Screening: Recommended annual ophthalmology exams for early detection of glaucoma and other disorders of the eye. Is the patient up to date with their annual eye exam?  Yes  Who is the provider or what is the name of the office in which the patient attends annual eye exams? [facility] If pt is not established with a provider, would they like to be referred to a provider to establish care? No .   Dental Screening: Recommended annual dental exams for proper oral hygiene  Diabetic Foot Exam: Diabetic Foot Exam: Overdue, Pt has been advised about the importance in completing this exam. Pt is scheduled for diabetic foot exam on next appointment.  Community Resource Referral / Chronic Care Management: CRR required this visit?  No   CCM required this visit?  No     Plan:     I have personally reviewed and noted the following in the patient's chart:   Medical and social history Use of alcohol,  tobacco or illicit drugs  Current medications and supplements including opioid prescriptions. Patient is not currently taking opioid prescriptions. Functional ability and status Nutritional status Physical activity Advanced directives List of other physicians Hospitalizations, surgeries, and ER visits in previous 12 months Vitals Screenings to include cognitive, depression, and falls Referrals and appointments  In addition, I have reviewed and discussed with patient certain preventive protocols, quality metrics, and best practice recommendations. A written personalized care plan for preventive services as well as general preventive health recommendations were provided to patient.     Areatha Beecham, LPN   [id number]   After Visit Summary: (MyChart) Due to this being a telephonic visit, the after visit summary with patients personalized plan was offered to patient via MyChart   Nurse Notes: none

## 2023-01-19 NOTE — Patient Instructions (Signed)
 Holly Peters , Thank you for taking time to come for your Medicare Wellness Visit. I appreciate your ongoing commitment to your health goals. Please review the following plan we discussed and let me know if I can assist you in the future.   Referrals/Orders/Follow-Ups/Clinician Recommendations: none  This is a list of the screening recommended for you and due dates:  Health Maintenance  Topic Date Due   Complete foot exam   08/26/2022   COVID-19 Vaccine (7 - 2024-25 season) 11/15/2022   Eye exam for diabetics  01/27/2023   Mammogram  03/03/2023   Yearly kidney health urinalysis for diabetes  04/13/2023   Hemoglobin A1C  07/13/2023   Yearly kidney function blood test for diabetes  01/13/2024   Medicare Annual Wellness Visit  01/19/2024   Colon Cancer Screening  03/24/2027   DTaP/Tdap/Td vaccine (2 - Td or Tdap) 01/24/2031   Pneumonia Vaccine  Completed   Flu Shot  Completed   DEXA scan (bone density measurement)  Completed   Hepatitis C Screening  Completed   Zoster (Shingles) Vaccine  Completed   HPV Vaccine  Aged Out    Advanced directives: (Copy Requested) Please bring a copy of your health care power of attorney and living will to the office to be added to your chart at your convenience.  Next Medicare Annual Wellness Visit scheduled for next year: No, office will schedule  insert Preventive Care attachment Insert FALL PREVENTION attachment if needed

## 2023-02-03 DIAGNOSIS — H25813 Combined forms of age-related cataract, bilateral: Secondary | ICD-10-CM | POA: Diagnosis not present

## 2023-02-03 DIAGNOSIS — H04123 Dry eye syndrome of bilateral lacrimal glands: Secondary | ICD-10-CM | POA: Diagnosis not present

## 2023-02-03 DIAGNOSIS — E119 Type 2 diabetes mellitus without complications: Secondary | ICD-10-CM | POA: Diagnosis not present

## 2023-02-03 LAB — HM DIABETES EYE EXAM

## 2023-03-03 DIAGNOSIS — M109 Gout, unspecified: Secondary | ICD-10-CM | POA: Diagnosis not present

## 2023-03-03 DIAGNOSIS — E7800 Pure hypercholesterolemia, unspecified: Secondary | ICD-10-CM | POA: Diagnosis not present

## 2023-03-03 DIAGNOSIS — E1122 Type 2 diabetes mellitus with diabetic chronic kidney disease: Secondary | ICD-10-CM | POA: Diagnosis not present

## 2023-03-03 DIAGNOSIS — N183 Chronic kidney disease, stage 3 unspecified: Secondary | ICD-10-CM | POA: Diagnosis not present

## 2023-03-03 DIAGNOSIS — I129 Hypertensive chronic kidney disease with stage 1 through stage 4 chronic kidney disease, or unspecified chronic kidney disease: Secondary | ICD-10-CM | POA: Diagnosis not present

## 2023-03-07 LAB — LAB REPORT - SCANNED
Creatinine, POC: 108.2 mg/dL
EGFR: 17

## 2023-03-21 DIAGNOSIS — Z1231 Encounter for screening mammogram for malignant neoplasm of breast: Secondary | ICD-10-CM | POA: Diagnosis not present

## 2023-03-21 DIAGNOSIS — R92333 Mammographic heterogeneous density, bilateral breasts: Secondary | ICD-10-CM | POA: Diagnosis not present

## 2023-03-21 LAB — HM MAMMOGRAPHY

## 2023-03-25 ENCOUNTER — Encounter: Payer: Self-pay | Admitting: Internal Medicine

## 2023-03-30 ENCOUNTER — Other Ambulatory Visit: Payer: Self-pay

## 2023-03-30 ENCOUNTER — Emergency Department (HOSPITAL_BASED_OUTPATIENT_CLINIC_OR_DEPARTMENT_OTHER)
Admission: EM | Admit: 2023-03-30 | Discharge: 2023-03-30 | Disposition: A | Discharge status: Home / Self Care | Attending: Emergency Medicine | Admitting: Emergency Medicine

## 2023-03-30 ENCOUNTER — Emergency Department (HOSPITAL_BASED_OUTPATIENT_CLINIC_OR_DEPARTMENT_OTHER)

## 2023-03-30 ENCOUNTER — Encounter (HOSPITAL_BASED_OUTPATIENT_CLINIC_OR_DEPARTMENT_OTHER): Payer: Self-pay | Admitting: Emergency Medicine

## 2023-03-30 DIAGNOSIS — Y92511 Restaurant or cafe as the place of occurrence of the external cause: Secondary | ICD-10-CM | POA: Insufficient documentation

## 2023-03-30 DIAGNOSIS — S0081XA Abrasion of other part of head, initial encounter: Secondary | ICD-10-CM | POA: Diagnosis not present

## 2023-03-30 DIAGNOSIS — Y9301 Activity, walking, marching and hiking: Secondary | ICD-10-CM | POA: Diagnosis not present

## 2023-03-30 DIAGNOSIS — W19XXXA Unspecified fall, initial encounter: Secondary | ICD-10-CM | POA: Insufficient documentation

## 2023-03-30 DIAGNOSIS — S199XXA Unspecified injury of neck, initial encounter: Secondary | ICD-10-CM | POA: Diagnosis not present

## 2023-03-30 DIAGNOSIS — T07XXXA Unspecified multiple injuries, initial encounter: Secondary | ICD-10-CM

## 2023-03-30 DIAGNOSIS — I129 Hypertensive chronic kidney disease with stage 1 through stage 4 chronic kidney disease, or unspecified chronic kidney disease: Secondary | ICD-10-CM | POA: Diagnosis not present

## 2023-03-30 DIAGNOSIS — E119 Type 2 diabetes mellitus without complications: Secondary | ICD-10-CM | POA: Insufficient documentation

## 2023-03-30 DIAGNOSIS — S0990XA Unspecified injury of head, initial encounter: Secondary | ICD-10-CM

## 2023-03-30 DIAGNOSIS — N189 Chronic kidney disease, unspecified: Secondary | ICD-10-CM | POA: Diagnosis not present

## 2023-03-30 DIAGNOSIS — S00511A Abrasion of lip, initial encounter: Secondary | ICD-10-CM | POA: Diagnosis not present

## 2023-03-30 NOTE — ED Provider Notes (Signed)
 Emergency Department Provider Note   I have reviewed the triage vital signs and the nursing notes.   HISTORY  Chief Complaint Fall and Head Injury   HPI Holly Peters is a 73 y.o. female past history of diabetes, hypertension, hyperlipidemia presents emergency department after mechanical fall.  She was walking into a restaurant and tripped over a down spout that was coming into the walkway.  She is sure the fall was mechanical.  Denies trying to catch herself.  She did strike her face on the ground.  No loss of consciousness.  She is not anticoagulated.   Past Medical History:  Diagnosis Date   Chronic kidney disease    Diabetes mellitus without complication (HCC)    Hyperlipemia    Hypertension     Review of Systems  Constitutional: No fever/chills Cardiovascular: Denies chest pain. Respiratory: Denies shortness of breath. Gastrointestinal: No abdominal pain.  No nausea, no vomiting.  No diarrhea.  No constipation. Genitourinary: Negative for dysuria. Musculoskeletal: Negative for back pain. Skin: Abrasion to the forehead.  Neurological: Positive HA.   ____________________________________________   PHYSICAL EXAM:  VITAL SIGNS: ED Triage Vitals  Encounter Vitals Group     BP 03/30/23 1743 (!) 159/77     Pulse Rate 03/30/23 1743 93     Resp 03/30/23 1743 20     Temp 03/30/23 1743 97.9 F (36.6 C)     Temp src --      SpO2 03/30/23 1743 100 %     Weight 03/30/23 1740 130 lb (59 kg)     Height 03/30/23 1740 5' 1.5" (1.562 m)   Constitutional: Alert and oriented. Well appearing and in no acute distress. Eyes: Conjunctivae are normal. PERRL.  Head: Central forehead abrasion without laceration. No large hematoma.  Nose: No congestion/rhinnorhea. Mouth/Throat: Mucous membranes are moist.   Neck: No stridor. No cervical spine tenderness to palpation. Cardiovascular: Normal rate, regular rhythm. Good peripheral circulation. Grossly normal heart sounds.    Respiratory: Normal respiratory effort.  No retractions. Lungs CTAB. Gastrointestinal: Soft and nontender. No distention.  Musculoskeletal: No lower extremity tenderness nor edema. No gross deformities of extremities. Neurologic:  Normal speech and language. No gross focal neurologic deficits are appreciated.  Skin:  Skin is warm, dry. Abrasion to the forehead as above. Faint abrasion to the upper lip.  ____________________________________________  RADIOLOGY  No results found.  ____________________________________________   PROCEDURES  Procedure(s) performed:   Procedures   ____________________________________________   INITIAL IMPRESSION / ASSESSMENT AND PLAN / ED COURSE  Pertinent labs & imaging results that were available during my care of the patient were reviewed by me and considered in my medical decision making (see chart for details).   This patient is Presenting for Evaluation of head injury, which does require a range of treatment options, and is a complaint that involves a high risk of morbidity and mortality.  The Differential Diagnoses includes subdural hematoma, epidural hematoma, acute concussion, traumatic subarachnoid hemorrhage, cerebral contusions, etc.   Critical Interventions-    Medications - No data to display  Reassessment after intervention:     I did obtain Additional Historical Information from husband at bedside.  Radiologic Tests Ordered, included CT head and c spine. I independently interpreted the images and agree with radiology interpretation.   Cardiac Monitor Tracing which shows NSR.    Social Determinants of Health Risk patient is a non-smoker.   Consult complete with  Medical Decision Making: Summary:  Patient presents to the emergency  department for evaluation of head injury.  Plan for CT imaging which was ordered in triage.  Will follow this.  No neurodeficits.  Patient is up and ambulatory in the ED to the bathroom without  difficulty.  Reevaluation with update and discussion with   ***Considered admission***  Patient's presentation is most consistent with acute presentation with potential threat to life or bodily function.   Disposition:   ____________________________________________  FINAL CLINICAL IMPRESSION(S) / ED DIAGNOSES  Final diagnoses:  None     NEW OUTPATIENT MEDICATIONS STARTED DURING THIS VISIT:  New Prescriptions   No medications on file    Note:  This document was prepared using Dragon voice recognition software and may include unintentional dictation errors.  Alona Bene, MD, [facility]

## 2023-03-30 NOTE — Discharge Instructions (Signed)
 You were seen in the emergency room today after a fall.  Your CT scan did not show any broken bones or bleeding.  Keep your abrasions clean and dry.  You may apply a thin layer of bacitracin for the first couple of days.  Return with any new or suddenly worsening symptoms.

## 2023-03-30 NOTE — ED Notes (Signed)
 Patient transported to CT

## 2023-03-30 NOTE — ED Triage Notes (Signed)
 Pt fell (tripped) forward onto concrete just PTA; abrasions noted to forehead; does not take blood thinners; denies LOC; reports dizziness after fall

## 2023-03-30 NOTE — ED Notes (Signed)

## 2023-04-04 ENCOUNTER — Telehealth: Payer: Self-pay

## 2023-04-04 NOTE — Transitions of Care (Post Inpatient/ED Visit) (Cosign Needed)
   04/04/2023  Name: Holly Peters MRN: [id number] DOB: [birth year]  Today's TOC FU Call Status:   Patient's Name and Date of Birth confirmed.  Transition Care Management Follow-up Telephone Call Date of Discharge: 03/30/23 Discharge Facility: [facility] Type of Discharge: Emergency Department Reason for ED Visit: Other: How have you been since you were released from the hospital?: Better Any questions or concerns?: No  Items Reviewed: Did you receive and understand the discharge instructions provided?: Yes Medications obtained,verified, and reconciled?: Yes (Medications Reviewed) Any new allergies since your discharge?: No Dietary orders reviewed?: NA Do you have support at home?: Yes People in Home: spouse  Medications Reviewed Today: Medications Reviewed Today     Reviewed by Coolidge Breeze, CMA (Certified Medical Assistant) on 04/04/23 at 1014  Med List Status: <None>   Medication Order Taking? Sig Documenting Provider Last Dose Status Informant  allopurinol (ZYLOPRIM) 100 MG tablet [id number] Yes TAKE 1 TABLET(100 MG) BY MOUTH DAILY Dorothyann Peng, MD Taking Active   dapagliflozin propanediol (FARXIGA) 5 MG TABS tablet [id number] Yes  [provider] Taking Active   diltiazem (CARDIZEM CD) 240 MG 24 hr capsule [id number] Yes Take 1 capsule (240 mg total) by mouth daily. Dorothyann Peng, MD Taking Active   glucose blood (FREESTYLE LITE) test strip [id number] Yes USE TO CHECK BLOOD SUGAR ONCE DAILY Dorothyann Peng, MD Taking Active   MICARDIS HCT 40-12.5 MG tablet [id number] Yes Take 1 tablet by mouth daily. Dorothyann Peng, MD Taking Active   Psyllium [facility] FIBER PO) [id number] Yes Take by mouth. prn [provider] Taking Active   simvastatin (ZOCOR) 20 MG tablet [id number] Yes Take 1 tablet (20 mg total) by mouth daily. Dorothyann Peng, MD Taking Active   TRULICITY 0.75 MG/0.5ML Namon Cirri [id number] Yes INJECT 0.75 MG UNDER THE SKIN ONCE A WEEK  Dorothyann Peng, MD Taking Active             Home Care and Equipment/Supplies: Were Home Health Services Ordered?: NA Any new equipment or medical supplies ordered?: NA  Functional Questionnaire: Do you need assistance with bathing/showering or dressing?: No Do you need assistance with meal preparation?: No Do you need assistance with eating?: No Do you have difficulty maintaining continence: No Do you need assistance with getting out of bed/getting out of a chair/moving?: No Do you have difficulty managing or taking your medications?: No  Follow up appointments reviewed: PCP Follow-up appointment confirmed?: Yes Date of PCP follow-up appointment?: 04/07/23 Follow-up Provider: [facility] Follow-up appointment confirmed?: NA Do you need transportation to your follow-up appointment?: No Do you understand care options if your condition(s) worsen?: Yes-patient verbalized understanding    SIGNATURE Randa Lynn, CMA

## 2023-04-07 ENCOUNTER — Encounter: Payer: Self-pay | Admitting: Family Medicine

## 2023-04-07 ENCOUNTER — Ambulatory Visit: Admitting: Family Medicine

## 2023-04-07 VITALS — BP 120/60 | HR 81 | Temp 97.8°F | Ht 61.5 in | Wt 134.0 lb

## 2023-04-07 DIAGNOSIS — N184 Chronic kidney disease, stage 4 (severe): Secondary | ICD-10-CM

## 2023-04-07 DIAGNOSIS — E1122 Type 2 diabetes mellitus with diabetic chronic kidney disease: Secondary | ICD-10-CM | POA: Diagnosis not present

## 2023-04-07 DIAGNOSIS — S0990XD Unspecified injury of head, subsequent encounter: Secondary | ICD-10-CM

## 2023-04-07 DIAGNOSIS — T07XXXA Unspecified multiple injuries, initial encounter: Secondary | ICD-10-CM | POA: Diagnosis not present

## 2023-04-07 NOTE — Progress Notes (Unsigned)
 I,Jameka J Llittleton, CMA,acting as a Neurosurgeon for [employer], NP.,have documented all relevant documentation on the behalf of Holly Hose, NP,as directed by  Holly Hose, NP while in the presence of Holly Hose, NP.  Subjective:  Patient ID: Holly Peters , female    DOB: 1951/01/04 , 73 y.o.   MRN: [id number]  Chief Complaint  Patient presents with   ER f/u    HPI  Patient is a 73 year old female who presents today for a ER follow up. Patient had a mechanical fall  on 03/30/23. She states that as when she was going into the restaurant that evening and tripped over a spike from the side of the building. She had CT head and CT  cervical done but no injuries noted. She subtained simple abrasions on her head  Patient reports she is feeling okay.  She is followed by Dr Annie Sable. [facility]     Past Medical History:  Diagnosis Date   Chronic kidney disease    Diabetes mellitus without complication (HCC)    Hyperlipemia    Hypertension      Family History  Problem Relation Age of Onset   Diabetes Mother    Healthy Father      Current Outpatient Medications:    allopurinol (ZYLOPRIM) 100 MG tablet, TAKE 1 TABLET(100 MG) BY MOUTH DAILY, Disp: 90 tablet, Rfl: 2   dapagliflozin propanediol (FARXIGA) 5 MG TABS tablet, , Disp: , Rfl:    diltiazem (CARDIZEM CD) 240 MG 24 hr capsule, Take 1 capsule (240 mg total) by mouth daily., Disp: 90 capsule, Rfl: 3   glucose blood (FREESTYLE LITE) test strip, USE TO CHECK BLOOD SUGAR ONCE DAILY, Disp: 100 strip, Rfl: 3   MICARDIS HCT 40-12.5 MG tablet, Take 1 tablet by mouth daily., Disp: 90 tablet, Rfl: 3   Psyllium (METAMUCIL FIBER PO), Take by mouth. prn, Disp: , Rfl:    simvastatin (ZOCOR) 20 MG tablet, Take 1 tablet (20 mg total) by mouth daily., Disp: 90 tablet, Rfl: 3   TRULICITY 0.75 MG/0.5ML SOPN, INJECT 0.75 MG UNDER THE SKIN ONCE A WEEK, Disp: 6 mL, Rfl: 3   No Known Allergies   Review of Systems   Constitutional: Negative.   HENT: Negative.    Eyes: Negative.   Respiratory: Negative.    Cardiovascular: Negative.   Endocrine: Negative for heat intolerance, polydipsia, polyphagia and polyuria.  Skin:  Positive for color change.       Bruising on face     Today's Vitals   04/07/23 1005  BP: 120/60  Pulse: 81  Temp: 97.8 F (36.6 C)  TempSrc: Oral  Weight: 134 lb (60.8 kg)  Height: 5' 1.5" (1.562 m)  PainSc: 0-No pain   Body mass index is 24.91 kg/m.  Wt Readings from Last 3 Encounters:  04/07/23 134 lb (60.8 kg)  03/30/23 130 lb (59 kg)  01/13/23 131 lb 9.6 oz (59.7 kg)    The 10-year ASCVD risk score (Arnett DK, et al., 2019) is: 24.4%   Values used to calculate the score:     Age: 48 years     Sex: Female     Is Non-Hispanic African American: Yes     Diabetic: Yes     Tobacco smoker: No     Systolic Blood Pressure: 120 mmHg     Is BP treated: Yes     HDL Cholesterol: 57 mg/dL     Total Cholesterol: 160 mg/dL  Objective:  Physical Exam HENT:     Head: Normocephalic.  Cardiovascular:     Rate and Rhythm: Normal rate.     Pulses: Normal pulses.     Heart sounds: Normal heart sounds.  Pulmonary:     Effort: Pulmonary effort is normal.     Breath sounds: Normal breath sounds.  Neurological:     Mental Status: She is alert.         Assessment And Plan:  Abrasions of multiple sites  Type 2 diabetes mellitus with stage 4 chronic kidney disease, without long-term current use of insulin (HCC)    Return if symptoms worsen or fail to improve, for Keep your next appt.  Patient was given opportunity to ask questions. Patient verbalized understanding of the plan and was able to repeat key elements of the plan. All questions were answered to their satisfaction.    I, Holly Hose, NP, have reviewed all documentation for this visit. The documentation on 04/14/23 for the exam, diagnosis, procedures, and orders are all accurate and complete.   IF YOU HAVE  BEEN REFERRED TO A SPECIALIST, IT MAY TAKE 1-2 WEEKS TO SCHEDULE/PROCESS THE REFERRAL. IF YOU HAVE NOT HEARD FROM US/SPECIALIST IN TWO WEEKS, PLEASE GIVE [place] A CALL AT [phone].

## 2023-04-14 DIAGNOSIS — T07XXXA Unspecified multiple injuries, initial encounter: Secondary | ICD-10-CM | POA: Insufficient documentation

## 2023-04-15 NOTE — Assessment & Plan Note (Signed)
 Advised to use acetaminophen as needed for pain as needed. NO NSAIDS

## 2023-04-15 NOTE — Assessment & Plan Note (Signed)
 Bruising gradually resolving

## 2023-05-18 ENCOUNTER — Other Ambulatory Visit: Payer: Self-pay | Admitting: Internal Medicine

## 2023-05-23 ENCOUNTER — Ambulatory Visit (INDEPENDENT_AMBULATORY_CARE_PROVIDER_SITE_OTHER): Payer: Medicare Other | Admitting: Internal Medicine

## 2023-05-23 ENCOUNTER — Encounter: Payer: Self-pay | Admitting: Internal Medicine

## 2023-05-23 VITALS — BP 124/70 | HR 78 | Temp 98.5°F | Ht 61.0 in | Wt 132.6 lb

## 2023-05-23 DIAGNOSIS — E1122 Type 2 diabetes mellitus with diabetic chronic kidney disease: Secondary | ICD-10-CM

## 2023-05-23 DIAGNOSIS — N184 Chronic kidney disease, stage 4 (severe): Secondary | ICD-10-CM

## 2023-05-23 DIAGNOSIS — E7800 Pure hypercholesterolemia, unspecified: Secondary | ICD-10-CM | POA: Diagnosis not present

## 2023-05-23 DIAGNOSIS — I129 Hypertensive chronic kidney disease with stage 1 through stage 4 chronic kidney disease, or unspecified chronic kidney disease: Secondary | ICD-10-CM | POA: Diagnosis not present

## 2023-05-23 DIAGNOSIS — Z6825 Body mass index (BMI) 25.0-25.9, adult: Secondary | ICD-10-CM

## 2023-05-23 NOTE — Assessment & Plan Note (Addendum)
 Chronic, due to underlying DM, LDL goal is less than 70.  She will c/w simvastatin  20mg  daily for now.  She is encouraged to follow a heart healthy lifestyle.

## 2023-05-23 NOTE — Assessment & Plan Note (Signed)
 Her BMI is acceptable for her demographic. She is encouraged to aim for at least 150 minutes of exercise per week. She likes to participate in line dancing.

## 2023-05-23 NOTE — Addendum Note (Signed)
 Addended by: Linnell Richardson T on: 05/23/2023 03:38 PM   Modules accepted: Orders

## 2023-05-23 NOTE — Patient Instructions (Signed)

## 2023-05-23 NOTE — Progress Notes (Signed)
 I,Victoria T Basil Lim, CMA,acting as a Neurosurgeon for Smiley Dung, MD.,have documented all relevant documentation on the behalf of Smiley Dung, MD,as directed by  Smiley Dung, MD while in the presence of Smiley Dung, MD.  Subjective:  Patient ID: Holly Peters , female    DOB: 09-22-1950 , 74 y.o.   MRN: [id number]  Chief Complaint  Patient presents with   Diabetes    Pt presents for her diabetes check up. Pt has No specific questions or concerns. Pt doesn't have any headache, chest pain or sob.    HPI Discussed the use of AI scribe software for clinical note transcription with the patient, who gave verbal consent to proceed.  History of Present Illness Holly Peters is a 73 year old female who presents for a diabetes and high blood pressure check.   Her diabetes is well-controlled with Trulicity  and [place]. She is experiencing difficulty obtaining Elvina Hammers and is awaiting its delivery. She requested samples to avoid running out. She also takes simvastatin  20 mg daily.  Her hypertension is managed with McC[place] 40/12.5 mg in the morning and diltiazem  in the evening. No new issues related to her blood pressure.  She underwent an eye exam in January at Dr. Aubrey Leaf office and routinely has eye exams every January.  There are no new updates from her kidney specialist, and she is scheduled for a follow-up in June.  She stays hydrated by drinking mineral water. No changes in her general well-being. For exercise, she engages in 'glass dancing' and occasionally walks after dinner, depending on her schedule.   Diabetes She presents for her follow-up diabetic visit. She has type 2 diabetes mellitus. Her disease course has been stable. There are no hypoglycemic associated symptoms. Pertinent negatives for diabetes include no blurred vision, no polydipsia, no polyphagia and no polyuria. There are no hypoglycemic complications. Risk factors for coronary artery disease include  diabetes mellitus, dyslipidemia, hypertension, sedentary lifestyle and post-menopausal. Her weight is stable. She is following a diabetic diet. Her breakfast blood glucose is taken between 8-9 am. Her breakfast blood glucose range is generally 90-110 mg/dl. Eye exam is current.  Hypertension This is a chronic problem. The current episode started more than 1 year ago. The problem has been gradually improving since onset. The problem is controlled. Pertinent negatives include no blurred vision.     Past Medical History:  Diagnosis Date   Chronic kidney disease    Diabetes mellitus without complication (HCC)    Hyperlipemia    Hypertension      Family History  Problem Relation Age of Onset   Diabetes Mother    Healthy Father      Current Outpatient Medications:    allopurinol  (ZYLOPRIM ) 100 MG tablet, TAKE 1 TABLET(100 MG) BY MOUTH DAILY, Disp: 90 tablet, Rfl: 2   dapagliflozin propanediol (FARXIGA) 5 MG TABS tablet, , Disp: , Rfl:    diltiazem  (CARDIZEM  CD) 240 MG 24 hr capsule, Take 1 capsule (240 mg total) by mouth daily., Disp: 90 capsule, Rfl: 3   glucose blood (FREESTYLE LITE) test strip, USE TO CHECK BLOOD SUGAR ONCE DAILY, Disp: 100 strip, Rfl: 3   MICARDIS  HCT 40-12.5 MG tablet, Take 1 tablet by mouth daily., Disp: 90 tablet, Rfl: 3   Psyllium (METAMUCIL FIBER PO), Take by mouth. prn, Disp: , Rfl:    simvastatin  (ZOCOR ) 20 MG tablet, Take 1 tablet (20 mg total) by mouth daily., Disp: 90 tablet, Rfl: 3   TRULICITY  0.75  MG/0.5ML SOAJ, INJECT 0.75 MG UNDER THE SKIN ONCE A WEEK, Disp: 6 mL, Rfl: 3   No Known Allergies   Review of Systems  Constitutional: Negative.   Eyes:  Negative for blurred vision.  Respiratory: Negative.    Cardiovascular: Negative.   Endocrine: Negative for polydipsia, polyphagia and polyuria.  Neurological: Negative.   Psychiatric/Behavioral: Negative.       Today's Vitals   05/23/23 0916  BP: 124/70  Pulse: 78  Temp: 98.5 F (36.9 C)  SpO2:  98%  Weight: 132 lb 9.6 oz (60.1 kg)  Height: 5\' 1"  (1.549 m)   Body mass index is 25.05 kg/m.  Wt Readings from Last 3 Encounters:  05/23/23 132 lb 9.6 oz (60.1 kg)  04/07/23 134 lb (60.8 kg)  03/30/23 130 lb (59 kg)     Objective:  Physical Exam Vitals and nursing note reviewed.  Constitutional:      Appearance: Normal appearance.  HENT:     Head: Normocephalic and atraumatic.  Eyes:     Extraocular Movements: Extraocular movements intact.  Cardiovascular:     Rate and Rhythm: Normal rate and regular rhythm.     Heart sounds: Normal heart sounds.  Pulmonary:     Effort: Pulmonary effort is normal.     Breath sounds: Normal breath sounds.  Musculoskeletal:     Cervical back: Normal range of motion.  Skin:    General: Skin is warm.  Neurological:     General: No focal deficit present.     Mental Status: She is alert.  Psychiatric:        Mood and Affect: Mood normal.        Behavior: Behavior normal.         Assessment And Plan:  Type 2 diabetes mellitus with stage 4 chronic kidney disease, without long-term current use of insulin (HCC) Assessment & Plan: Chronic, Nephrology input is appreciated. Will fax labs to Dr. Ansel Kingdom once resulted. She will continue with Trulicity  and she was given samples of Farxiga 5mg  daily. Issue with Elvina Hammers access; samples provided pending prior authorization. - Provide Farxiga samples. - Coordinate Farxiga prior authorization with pharmacy.  Orders: -     CMP14+EGFR -     Hemoglobin A1c -     PTH, intact and calcium -     Phosphorus -     Protein electrophoresis, serum  Parenchymal renal hypertension, stage 1 through stage 4 or unspecified chronic kidney disease Assessment & Plan: Chronic, well controlled.  She will continue Micardis /hct 40/12.5mg  daily and diltiazem  240mg  daily. She is encouraged to follow a low sodium diet.    Orders: -     CMP14+EGFR  Pure hypercholesterolemia Assessment & Plan: Chronic, due to  underlying DM, LDL goal is less than 70.  She will c/w simvastatin  20mg  daily for now.  She is encouraged to follow a heart healthy lifestyle.    Orders: -     CMP14+EGFR  Body mass index (BMI) 25.0-25.9, adult Assessment & Plan: Her BMI is acceptable for her demographic. She is encouraged to aim for at least 150 minutes of exercise per week. She likes to participate in line dancing.     Return for 4 month follow up for DM check.  Patient was given opportunity to ask questions. Patient verbalized understanding of the plan and was able to repeat key elements of the plan. All questions were answered to their satisfaction.    I, Smiley Dung, MD, have reviewed all documentation for this  visit. The documentation on 05/23/23 for the exam, diagnosis, procedures, and orders are all accurate and complete.   IF YOU HAVE BEEN REFERRED TO A SPECIALIST, IT MAY TAKE 1-2 WEEKS TO SCHEDULE/PROCESS THE REFERRAL. IF YOU HAVE NOT HEARD FROM US /SPECIALIST IN TWO WEEKS, PLEASE GIVE US  A CALL AT [phone].   THE PATIENT IS ENCOURAGED TO PRACTICE SOCIAL DISTANCING DUE TO THE COVID-19 PANDEMIC.

## 2023-05-23 NOTE — Assessment & Plan Note (Signed)
 Chronic, well controlled.  She will continue Micardis/hct 40/12.5mg  daily and diltiazem 240mg  daily. She is encouraged to follow a low sodium diet.

## 2023-05-23 NOTE — Assessment & Plan Note (Signed)
 Chronic, Nephrology input is appreciated. Will fax labs to Dr. Ansel Kingdom once resulted. She will continue with Trulicity  and she was given samples of Farxiga 5mg  daily. Issue with Elvina Hammers access; samples provided pending prior authorization. - Provide Farxiga samples. - Coordinate Farxiga prior authorization with pharmacy.

## 2023-05-25 LAB — CMP14+EGFR
ALT: 15 IU/L (ref 0–32)
AST: 19 IU/L (ref 0–40)
Albumin: 4.9 g/dL — ABNORMAL HIGH (ref 3.8–4.8)
Alkaline Phosphatase: 66 IU/L (ref 44–121)
BUN/Creatinine Ratio: 14 (ref 12–28)
BUN: 35 mg/dL — ABNORMAL HIGH (ref 8–27)
Bilirubin Total: 0.3 mg/dL (ref 0.0–1.2)
CO2: 18 mmol/L — ABNORMAL LOW (ref 20–29)
Calcium: 9.8 mg/dL (ref 8.7–10.3)
Chloride: 111 mmol/L — ABNORMAL HIGH (ref 96–106)
Creatinine, Ser: 2.55 mg/dL — ABNORMAL HIGH (ref 0.57–1.00)
Globulin, Total: 2.3 g/dL (ref 1.5–4.5)
Glucose: 109 mg/dL — ABNORMAL HIGH (ref 70–99)
Potassium: 4.3 mmol/L (ref 3.5–5.2)
Sodium: 149 mmol/L — ABNORMAL HIGH (ref 134–144)
Total Protein: 7.2 g/dL (ref 6.0–8.5)
eGFR: 19 mL/min/{1.73_m2} — ABNORMAL LOW (ref >59)

## 2023-05-25 LAB — PTH, INTACT AND CALCIUM: PTH: 121 pg/mL — ABNORMAL HIGH (ref 15–65)

## 2023-05-25 LAB — PROTEIN ELECTROPHORESIS, SERUM
A/G Ratio: 1.2 (ref 0.7–1.7)
Albumin ELP: 3.9 g/dL (ref 2.9–4.4)
Alpha 1: 0.3 g/dL (ref 0.0–0.4)
Alpha 2: 0.8 g/dL (ref 0.4–1.0)
Beta: 1 g/dL (ref 0.7–1.3)
Gamma Globulin: 1.2 g/dL (ref 0.4–1.8)
Globulin, Total: 3.3 g/dL (ref 2.2–3.9)

## 2023-05-25 LAB — HEMOGLOBIN A1C
Est. average glucose Bld gHb Est-mCnc: 134 mg/dL
Hgb A1c MFr Bld: 6.3 % — ABNORMAL HIGH (ref 4.8–5.6)

## 2023-05-25 LAB — PHOSPHORUS: Phosphorus: 3.9 mg/dL (ref 3.0–4.3)

## 2023-05-29 ENCOUNTER — Ambulatory Visit: Payer: Self-pay | Admitting: Internal Medicine

## 2023-06-01 ENCOUNTER — Other Ambulatory Visit: Payer: Self-pay

## 2023-06-01 DIAGNOSIS — E870 Hyperosmolality and hypernatremia: Secondary | ICD-10-CM

## 2023-06-14 ENCOUNTER — Other Ambulatory Visit: Payer: Self-pay

## 2023-06-14 DIAGNOSIS — E870 Hyperosmolality and hypernatremia: Secondary | ICD-10-CM

## 2023-06-14 LAB — BASIC METABOLIC PANEL WITH GFR
BUN/Creatinine Ratio: 13 (ref 12–28)
BUN: 34 mg/dL — ABNORMAL HIGH (ref 8–27)
CO2: 18 mmol/L — ABNORMAL LOW (ref 20–29)
Calcium: 10 mg/dL (ref 8.7–10.3)
Chloride: 107 mmol/L — ABNORMAL HIGH (ref 96–106)
Creatinine, Ser: 2.54 mg/dL — ABNORMAL HIGH (ref 0.57–1.00)
Glucose: 105 mg/dL — ABNORMAL HIGH (ref 70–99)
Potassium: 4.8 mmol/L (ref 3.5–5.2)
Sodium: 140 mmol/L (ref 134–144)
eGFR: 19 mL/min/{1.73_m2} — ABNORMAL LOW (ref >59)

## 2023-06-15 ENCOUNTER — Ambulatory Visit: Payer: Self-pay | Admitting: Internal Medicine

## 2023-07-11 ENCOUNTER — Other Ambulatory Visit: Payer: Self-pay | Admitting: Internal Medicine

## 2023-08-24 DIAGNOSIS — M109 Gout, unspecified: Secondary | ICD-10-CM | POA: Diagnosis not present

## 2023-08-24 DIAGNOSIS — N183 Chronic kidney disease, stage 3 unspecified: Secondary | ICD-10-CM | POA: Diagnosis not present

## 2023-08-24 DIAGNOSIS — I129 Hypertensive chronic kidney disease with stage 1 through stage 4 chronic kidney disease, or unspecified chronic kidney disease: Secondary | ICD-10-CM | POA: Diagnosis not present

## 2023-08-24 DIAGNOSIS — E1122 Type 2 diabetes mellitus with diabetic chronic kidney disease: Secondary | ICD-10-CM | POA: Diagnosis not present

## 2023-08-24 DIAGNOSIS — E7800 Pure hypercholesterolemia, unspecified: Secondary | ICD-10-CM | POA: Diagnosis not present

## 2023-08-24 LAB — LAB REPORT - SCANNED: EGFR: 19

## 2023-09-28 ENCOUNTER — Encounter: Payer: Self-pay | Admitting: Internal Medicine

## 2023-09-28 ENCOUNTER — Ambulatory Visit: Admitting: Internal Medicine

## 2023-09-28 VITALS — BP 110/68 | HR 79 | Temp 98.1°F | Ht 61.0 in | Wt 132.6 lb

## 2023-09-28 DIAGNOSIS — I129 Hypertensive chronic kidney disease with stage 1 through stage 4 chronic kidney disease, or unspecified chronic kidney disease: Secondary | ICD-10-CM | POA: Diagnosis not present

## 2023-09-28 DIAGNOSIS — E1122 Type 2 diabetes mellitus with diabetic chronic kidney disease: Secondary | ICD-10-CM | POA: Diagnosis not present

## 2023-09-28 DIAGNOSIS — Z23 Encounter for immunization: Secondary | ICD-10-CM | POA: Diagnosis not present

## 2023-09-28 DIAGNOSIS — N184 Chronic kidney disease, stage 4 (severe): Secondary | ICD-10-CM | POA: Diagnosis not present

## 2023-09-28 DIAGNOSIS — E7800 Pure hypercholesterolemia, unspecified: Secondary | ICD-10-CM | POA: Diagnosis not present

## 2023-09-28 NOTE — Progress Notes (Signed)
 I,[name], CMA,acting as a Neurosurgeon for [name], MD.,have documented all relevant documentation on the behalf of [name], MD,as directed by  [name], MD while in the presence of [name], MD.  Subjective:  Patient ID: Holly Peters , female    DOB: Apr 01, 1950 , 73 y.o.   MRN: [id number]  Chief Complaint  Patient presents with   Diabetes    Patient presents today for dm, bp & cholesterol follow up. She reports compliance with medications. Denies headache, chest pain & sob.    Hypertension   Hyperlipidemia    HPI Discussed the use of AI scribe software for clinical note transcription with the patient, who gave verbal consent to proceed.  History of Present Illness Holly Peters is a 73 year old female with diabetes and hypertension who presents for a diabetes and blood pressure check.  Her blood sugar levels have been stable, with a fingerstick reading of 117 mg/dL this morning. She engages in physical activity, specifically a dance routine, three times a week for the past three years. She recently switched from Farxiga to Jardiance 10 mg due to insurance issues and has been on Jardiance for about two months without any issues.  Her current medications include allopurinol  90 mg, Farxiga 5 mg, Cardizem  240 mg, and Micardis  40/12.5 mg. She mentions a discrepancy in her kidney disease staging, with her kidney doctor indicating stage 3, while reports from this clinic show stage 4, with a GFR of 19.  She had an eye exam in January, a mammogram in March, and a bone density test last year. Her last colonoscopy was at age 77. She received a flu shot today.   Diabetes She presents for her follow-up diabetic visit. She has type 2 diabetes mellitus. Her disease course has been stable. There are no hypoglycemic associated symptoms. Pertinent negatives for diabetes include no blurred vision, no polydipsia, no polyphagia and no polyuria. There are no  hypoglycemic complications. Risk factors for coronary artery disease include diabetes mellitus, dyslipidemia, hypertension, sedentary lifestyle and post-menopausal. Her weight is stable. She is following a diabetic diet. Her breakfast blood glucose is taken between 8-9 am. Her breakfast blood glucose range is generally 90-110 mg/dl. Eye exam is current.  Hypertension This is a chronic problem. The current episode started more than 1 year ago. The problem has been gradually improving since onset. The problem is controlled. Pertinent negatives include no blurred vision. Risk factors for coronary artery disease include diabetes mellitus, dyslipidemia and post-menopausal state. Past treatments include calcium channel blockers, angiotensin blockers and diuretics.     Past Medical History:  Diagnosis Date   Chronic kidney disease    Diabetes mellitus without complication (HCC)    Hyperlipemia    Hypertension      Family History  Problem Relation Age of Onset   Diabetes Mother    Healthy Father      Current Outpatient Medications:    allopurinol  (ZYLOPRIM ) 100 MG tablet, TAKE 1 TABLET(100 MG) BY MOUTH DAILY, Disp: 90 tablet, Rfl: 2   diltiazem  (CARDIZEM  CD) 240 MG 24 hr capsule, Take 1 capsule (240 mg total) by mouth daily., Disp: 90 capsule, Rfl: 3   glucose blood (FREESTYLE LITE) test strip, USE TO CHECK BLOOD SUGAR ONCE DAILY, Disp: 100 strip, Rfl: 3   MICARDIS  HCT 40-12.5 MG tablet, Take 1 tablet by mouth daily., Disp: 90 tablet, Rfl: 3   Psyllium (METAMUCIL FIBER PO), Take by mouth. prn, Disp: ,  Rfl:    simvastatin  (ZOCOR ) 20 MG tablet, Take 1 tablet (20 mg total) by mouth daily., Disp: 90 tablet, Rfl: 3   TRULICITY  0.75 MG/0.5ML SOAJ, INJECT 0.75 MG UNDER THE SKIN ONCE A WEEK, Disp: 6 mL, Rfl: 3   JARDIANCE 10 MG TABS tablet, 10 mg., Disp: , Rfl:    No Known Allergies   Review of Systems  Constitutional: Negative.   Eyes:  Negative for blurred vision.  Respiratory: Negative.     Cardiovascular: Negative.   Gastrointestinal: Negative.   Endocrine: Negative for polydipsia, polyphagia and polyuria.  Neurological: Negative.   Psychiatric/Behavioral: Negative.       Today's Vitals   09/28/23 1021  BP: 110/68  Pulse: 79  Temp: 98.1 F (36.7 C)  SpO2: 98%  Weight: 132 lb 9.6 oz (60.1 kg)  Height: 5' 1 (1.549 m)   Body mass index is 25.05 kg/m.  Wt Readings from Last 3 Encounters:  09/28/23 132 lb 9.6 oz (60.1 kg)  05/23/23 132 lb 9.6 oz (60.1 kg)  04/07/23 134 lb (60.8 kg)     Objective:  Physical Exam Vitals and nursing note reviewed.  Constitutional:      Appearance: Normal appearance.  HENT:     Head: Normocephalic and atraumatic.  Eyes:     Extraocular Movements: Extraocular movements intact.  Cardiovascular:     Rate and Rhythm: Normal rate and regular rhythm.     Heart sounds: Normal heart sounds.  Pulmonary:     Effort: Pulmonary effort is normal.     Breath sounds: Normal breath sounds.  Musculoskeletal:     Cervical back: Normal range of motion.  Skin:    General: Skin is warm.  Neurological:     General: No focal deficit present.     Mental Status: She is alert.  Psychiatric:        Mood and Affect: Mood normal.        Behavior: Behavior normal.         Assessment And Plan:  Type 2 diabetes mellitus with stage 4 chronic kidney disease, without long-term current use of insulin (HCC) Assessment & Plan: Chronic, Nephrology input is appreciated.   She will continue with Trulicity  and Jardiance 10mg  daily.SABRA  GFR stable at 19, indicating stage 4 CKD. - Perform A1c test. - Ensure adequate hydration. - Seek emergency care if nausea, vomiting, or diarrhea occurs more than twice in 24 hours.   Orders: -     Lipid panel -     Hemoglobin A1c -     Microalbumin / creatinine urine ratio -     Hepatic function panel  Parenchymal renal hypertension, stage 1 through stage 4 or unspecified chronic kidney disease Assessment &  Plan: Chronic, well controlled.  She will continue Micardis /hct 40/12.5mg  daily and diltiazem  240mg  daily. She is encouraged to follow a low sodium diet.    Orders: -     Lipid panel  Pure hypercholesterolemia Assessment & Plan: Chronic, due to underlying DM, LDL goal is less than 70.  She will c/w simvastatin  20mg  daily for now.  She is encouraged to follow a heart healthy lifestyle.    Orders: -     Lipid panel -     Hepatic function panel  Immunization due -     Flu vaccine HIGH DOSE PF(Fluzone Trivalent)   Return for 4 month dm f/u.SABRA  Patient was given opportunity to ask questions. Patient verbalized understanding of the plan and was able to repeat  key elements of the plan. All questions were answered to their satisfaction.   I, [name], MD, have reviewed all documentation for this visit. The documentation on 09/28/23 for the exam, diagnosis, procedures, and orders are all accurate and complete.  IF YOU HAVE BEEN REFERRED TO A SPECIALIST, IT MAY TAKE 1-2 WEEKS TO SCHEDULE/PROCESS THE REFERRAL. IF YOU HAVE NOT HEARD FROM US /SPECIALIST IN TWO WEEKS, PLEASE GIVE US  A CALL AT [phone].   THE PATIENT IS ENCOURAGED TO PRACTICE SOCIAL DISTANCING DUE TO THE COVID-19 PANDEMIC.

## 2023-09-28 NOTE — Patient Instructions (Signed)

## 2023-09-29 LAB — LIPID PANEL
Chol/HDL Ratio: 2.7 ratio (ref 0.0–4.4)
Cholesterol, Total: 167 mg/dL (ref 100–199)
HDL: 62 mg/dL (ref >39)
LDL Chol Calc (NIH): 77 mg/dL (ref 0–99)
Triglycerides: 165 mg/dL — ABNORMAL HIGH (ref 0–149)
VLDL Cholesterol Cal: 28 mg/dL (ref 5–40)

## 2023-09-29 LAB — HEPATIC FUNCTION PANEL
ALT: 32 IU/L (ref 0–32)
AST: 36 IU/L (ref 0–40)
Albumin: 4.7 g/dL (ref 3.8–4.8)
Alkaline Phosphatase: 64 IU/L (ref 49–135)
Bilirubin Total: 0.4 mg/dL (ref 0.0–1.2)
Bilirubin, Direct: 0.16 mg/dL (ref 0.00–0.40)
Total Protein: 7.3 g/dL (ref 6.0–8.5)

## 2023-09-29 LAB — HEMOGLOBIN A1C
Est. average glucose Bld gHb Est-mCnc: 140 mg/dL
Hgb A1c MFr Bld: 6.5 % — ABNORMAL HIGH (ref 4.8–5.6)

## 2023-09-29 LAB — MICROALBUMIN / CREATININE URINE RATIO
Creatinine, Urine: 103.5 mg/dL
Microalb/Creat Ratio: 47 mg/g{creat} — ABNORMAL HIGH (ref 0–29)
Microalbumin, Urine: 48.5 ug/mL

## 2023-10-01 NOTE — Assessment & Plan Note (Signed)
 Chronic, well controlled.  She will continue Micardis/hct 40/12.5mg  daily and diltiazem 240mg  daily. She is encouraged to follow a low sodium diet.

## 2023-10-01 NOTE — Assessment & Plan Note (Signed)
 Chronic, due to underlying DM, LDL goal is less than 70.  She will c/w simvastatin  20mg  daily for now.  She is encouraged to follow a heart healthy lifestyle.

## 2023-10-01 NOTE — Assessment & Plan Note (Signed)
 Chronic, Nephrology input is appreciated.   She will continue with Trulicity  and Jardiance 10mg  daily.SABRA  GFR stable at 19, indicating stage 4 CKD. - Perform A1c test. - Ensure adequate hydration. - Seek emergency care if nausea, vomiting, or diarrhea occurs more than twice in 24 hours.

## 2023-10-03 ENCOUNTER — Ambulatory Visit: Payer: Self-pay | Admitting: Internal Medicine

## 2023-11-16 DIAGNOSIS — N183 Chronic kidney disease, stage 3 unspecified: Secondary | ICD-10-CM | POA: Diagnosis not present

## 2023-11-23 DIAGNOSIS — E7800 Pure hypercholesterolemia, unspecified: Secondary | ICD-10-CM | POA: Diagnosis not present

## 2023-11-23 DIAGNOSIS — D649 Anemia, unspecified: Secondary | ICD-10-CM | POA: Diagnosis not present

## 2023-11-23 DIAGNOSIS — I129 Hypertensive chronic kidney disease with stage 1 through stage 4 chronic kidney disease, or unspecified chronic kidney disease: Secondary | ICD-10-CM | POA: Diagnosis not present

## 2023-11-23 DIAGNOSIS — E559 Vitamin D deficiency, unspecified: Secondary | ICD-10-CM | POA: Diagnosis not present

## 2023-11-23 DIAGNOSIS — M109 Gout, unspecified: Secondary | ICD-10-CM | POA: Diagnosis not present

## 2023-11-23 DIAGNOSIS — E1122 Type 2 diabetes mellitus with diabetic chronic kidney disease: Secondary | ICD-10-CM | POA: Diagnosis not present

## 2023-11-23 DIAGNOSIS — N250 Renal osteodystrophy: Secondary | ICD-10-CM | POA: Diagnosis not present

## 2023-11-23 DIAGNOSIS — N184 Chronic kidney disease, stage 4 (severe): Secondary | ICD-10-CM | POA: Diagnosis not present

## 2023-12-17 ENCOUNTER — Other Ambulatory Visit: Payer: Self-pay | Admitting: Internal Medicine

## 2024-01-25 ENCOUNTER — Other Ambulatory Visit: Payer: Self-pay

## 2024-01-25 MED ORDER — JARDIANCE 10 MG PO TABS: 10.0000 mg | ORAL_TABLET | Freq: Every day | ORAL | 1 refills | Status: DC

## 2024-02-01 ENCOUNTER — Ambulatory Visit

## 2024-02-01 ENCOUNTER — Other Ambulatory Visit: Payer: Self-pay | Admitting: Internal Medicine

## 2024-02-01 VITALS — Ht 61.5 in | Wt 130.0 lb

## 2024-02-01 DIAGNOSIS — Z0000 Encounter for general adult medical examination without abnormal findings: Secondary | ICD-10-CM

## 2024-02-01 NOTE — Progress Notes (Signed)
 "  Chief Complaint  Patient presents with   Medicare Wellness     Subjective:   Holly Peters is a 74 y.o. female who presents for a [employer] Visit.  Visit info / Clinical Intake: Medicare Wellness Visit Type:: Subsequent Annual Wellness Visit Persons participating in visit and providing information:: patient Medicare Wellness Visit Mode:: Telephone If telephone:: video error Since this visit was completed virtually, some vitals may be partially provided or unavailable. Missing vitals are due to the limitations of the virtual format.: Documented vitals are patient reported If Telephone or Video please confirm:: I connected with patient using audio/video enable telemedicine. I verified patient identity with two identifiers, discussed telehealth limitations, and patient agreed to proceed. Patient Location:: home Provider Location:: office Interpreter Needed?: No Pre-visit prep was completed: yes AWV questionnaire completed by patient prior to visit?: yes Date:: 01/31/24 Living arrangements:: (Patient-Rptd) lives with spouse/significant other Patient's Overall Health Status Rating: (Patient-Rptd) very good Typical amount of pain: (Patient-Rptd) none Does pain affect daily life?: (Patient-Rptd) no Are you currently prescribed opioids?: no  Dietary Habits and Nutritional Risks How many meals a day?: (Patient-Rptd) 2 Eats fruit and vegetables daily?: (Patient-Rptd) yes Most meals are obtained by: (Patient-Rptd) preparing own meals In the last 2 weeks, have you had any of the following?: none Diabetic:: (!) yes Any non-healing wounds?: no How often do you check your BS?: 1 Would you like to be referred to a Nutritionist or for Diabetic Management? : no  Functional Status Activities of Daily Living (to include ambulation/medication): (Patient-Rptd) Independent Ambulation: (Patient-Rptd) Independent Medication Administration: (Patient-Rptd) Independent Home  Management (perform basic housework or laundry): (Patient-Rptd) Independent Manage your own finances?: (Patient-Rptd) yes Primary transportation is: (Patient-Rptd) driving Concerns about vision?: no *vision screening is required for WTM* Concerns about hearing?: no  Fall Screening Falls in the past year?: (Patient-Rptd) 0 Number of falls in past year: 0 Was there an injury with Fall?: 0 Fall Risk Category Calculator: 0 Patient Fall Risk Level: Low Fall Risk  Fall Risk Patient at Risk for Falls Due to: Medication side effect Fall risk Follow up: Falls prevention discussed; Education provided; Falls evaluation completed  Home and Transportation Safety: All rugs have non-skid backing?: (!) (Patient-Rptd) no All stairs or steps have railings?: (Patient-Rptd) N/A, no stairs Grab bars in the bathtub or shower?: (Patient-Rptd) yes Have non-skid surface in bathtub or shower?: (Patient-Rptd) yes Good home lighting?: (Patient-Rptd) yes Regular seat belt use?: (Patient-Rptd) yes Hospital stays in the last year:: (Patient-Rptd) no  Cognitive Assessment Difficulty concentrating, remembering, or making decisions? : (Patient-Rptd) no Will 6CIT or Mini Cog be Completed: yes What year is it?: 0 points What month is it?: 0 points Give patient an address phrase to remember (5 components): [address] [place] About what time is it?: 0 points Count backwards from 20 to 1: 0 points Say the months of the year in reverse: 0 points Repeat the address phrase from earlier: 0 points 6 CIT Score: 0 points  Advance Directives (For Healthcare) Does Patient Have a Medical Advance Directive?: Yes Does patient want to make changes to medical advance directive?: No - Patient declined Type of Advance Directive: Healthcare Power of [place]; Living will Copy of Healthcare Power of Attorney in Chart?: No - copy requested Copy of Living Will in Chart?: No - copy requested  Reviewed/Updated   Reviewed/Updated: Reviewed All (Medical, Surgical, Family, Medications, Allergies, Care Teams, Patient Goals)    Allergies (verified) Patient has no known allergies.  Current Medications (verified) Outpatient Encounter Medications as of 02/01/2024  Medication Sig   allopurinol  (ZYLOPRIM ) 100 MG tablet TAKE 1 TABLET(100 MG) BY MOUTH DAILY   diltiazem  (CARDIZEM  CD) 240 MG 24 hr capsule Take 1 capsule (240 mg total) by mouth daily.   glucose blood (FREESTYLE LITE) test strip USE TO CHECK BLOOD SUGAR DAILY   JARDIANCE  10 MG TABS tablet Take 1 tablet (10 mg total) by mouth daily.   MICARDIS  HCT 40-12.5 MG tablet Take 1 tablet by mouth daily.   Psyllium (METAMUCIL FIBER PO) Take by mouth. prn   simvastatin  (ZOCOR ) 20 MG tablet Take 1 tablet (20 mg total) by mouth daily.   TRULICITY  0.75 MG/0.5ML SOAJ INJECT 0.75 MG UNDER THE SKIN ONCE A WEEK   No facility-administered encounter medications on file as of 02/01/2024.    History: Past Medical History:  Diagnosis Date   Chronic kidney disease    Diabetes mellitus without complication (HCC)    Hyperlipemia    Hypertension    Past Surgical History:  Procedure Laterality Date   ABDOMINAL HYSTERECTOMY     Family History  Problem Relation Age of Onset   Diabetes Mother    Healthy Father    Social History   Occupational History   Occupation: retired  Tobacco Use   Smoking status: Never   Smokeless tobacco: Never  Vaping Use   Vaping status: Never Used  Substance and Sexual Activity   Alcohol use: Never   Drug use: Never   Sexual activity: Yes    Birth control/protection: Surgical   Tobacco Counseling Counseling given: Not Answered  SDOH Screenings   Food Insecurity: No Food Insecurity (02/01/2024)  Housing: Unknown (02/01/2024)  Transportation Needs: No Transportation Needs (02/01/2024)  Utilities: Not At Risk (02/01/2024)  Alcohol Screen: Low Risk (02/01/2024)  Depression (PHQ2-9): Low Risk (02/01/2024)  Financial  Resource Strain: Low Risk (02/01/2024)  Physical Activity: Sufficiently Active (02/01/2024)  Social Connections: Socially Integrated (02/01/2024)  Stress: No Stress Concern Present (02/01/2024)  Tobacco Use: Low Risk (02/01/2024)  Health Literacy: Adequate Health Literacy (02/01/2024)   See flowsheets for full screening details  Depression Screen PHQ 2 & 9 Depression Scale- Over the past 2 weeks, how often have you been bothered by any of the following problems? Little interest or pleasure in doing things: 0 Feeling down, depressed, or hopeless (PHQ Adolescent also includes...irritable): 0 PHQ-2 Total Score: 0 Trouble falling or staying asleep, or sleeping too much: 0 Feeling tired or having little energy: 0 Poor appetite or overeating (PHQ Adolescent also includes...weight loss): 0 Feeling bad about yourself - or that you are a failure or have let yourself or your family down: 0 Trouble concentrating on things, such as reading the newspaper or watching television (PHQ Adolescent also includes...like school work): 0 Moving or speaking so slowly that other people could have noticed. Or the opposite - being so fidgety or restless that you have been moving around a lot more than usual: 0 Thoughts that you would be better off dead, or of hurting yourself in some way: 0 PHQ-9 Total Score: 0 If you checked off any problems, how difficult have these problems made it for you to do your work, take care of things at home, or get along with other people?: Not difficult at all  Depression Treatment Depression Interventions/Treatment : EYV7-0 Score <4 Follow-up Not Indicated     Goals Addressed               This Visit's Progress  wants to get off diabetes medicine (pt-stated)               Objective:    Today's Vitals   02/01/24 0816  Weight: 130 lb (59 kg)  Height: 5' 1.5 (1.562 m)   Body mass index is 24.17 kg/m.  Hearing/Vision screen Vision Screening - Comments:: Regular  eye exams Immunizations and Health Maintenance Health Maintenance  Topic Date Due   Mammogram  03/20/2024   OPHTHALMOLOGY EXAM  02/03/2024   Diabetic kidney evaluation - Urine ACR  03/27/2024   HEMOGLOBIN A1C  03/27/2024   COVID-19 Vaccine (8 - Pfizer risk 2025-26 season) 04/17/2024   FOOT EXAM  05/22/2024   Diabetic kidney evaluation - eGFR measurement  08/23/2024   Medicare Annual Wellness (AWV)  01/31/2025   Colonoscopy  03/24/2027   DTaP/Tdap/Td (2 - Td or Tdap) 01/24/2031   Pneumococcal Vaccine: 50+ Years  Completed   Influenza Vaccine  Completed   Bone Density Scan  Completed   Hepatitis C Screening  Completed   Zoster Vaccines- Shingrix  Completed   Meningococcal B Vaccine  Aged Out        Assessment/Plan:  This is a routine wellness examination for Holly Peters.  Patient Care Team: Jarold Medici, MD as PCP - General (Internal Medicine) Morgan Clayborne CROME, RN as [facility], [facility] Octavia Bruckner, MD as Consulting Physician (Ophthalmology) Octavia, Charlie Hamilton, MD as Consulting Physician (Ophthalmology)  I have personally reviewed and noted the following in the patients chart:   Medical and social history Use of alcohol, tobacco or illicit drugs  Current medications and supplements including opioid prescriptions. Functional ability and status Nutritional status Physical activity Advanced directives List of other physicians Hospitalizations, surgeries, and ER visits in previous 12 months Vitals Screenings to include cognitive, depression, and falls Referrals and appointments  No orders of the defined types were placed in this encounter.  In addition, I have reviewed and discussed with patient certain preventive protocols, quality metrics, and best practice recommendations. A written personalized care plan for preventive services as well as general preventive health recommendations were provided to  patient.   Holly FORBES Dawn, LPN   [id number]/71/7973   Return in 1 year (on 01/31/2025).  After Visit Summary: (MyChart) Due to this being a telephonic visit, the after visit summary with patients personalized plan was offered to patient via MyChart   Nurse Notes: No voiced or noted concerns at this time  "

## 2024-02-01 NOTE — Patient Instructions (Signed)
 Holly Peters,  Thank you for taking the time for your Medicare Wellness Visit. I appreciate your continued commitment to your health goals. Please review the care plan we discussed, and feel free to reach out if I can assist you further.  Please note that Annual Wellness Visits do not include a physical exam. Some assessments may be limited, especially if the visit was conducted virtually. If needed, we may recommend an in-person follow-up with your provider.  Ongoing Care Seeing your primary care provider every 3 to 6 months helps us  monitor your health and provide consistent, personalized care.   Referrals If a referral was made during today's visit and you haven't received any updates within two weeks, please contact the referred provider directly to check on the status.  Recommended Screenings:  Health Maintenance  Topic Date Due   Medicare Annual Wellness Visit  01/19/2024   Breast Cancer Screening  03/20/2024   Eye exam for diabetics  02/03/2024   Kidney health urinalysis for diabetes  03/27/2024   Hemoglobin A1C  03/27/2024   COVID-19 Vaccine (8 - Pfizer risk 2025-26 season) 04/17/2024   Complete foot exam   05/22/2024   Yearly kidney function blood test for diabetes  08/23/2024   Colon Cancer Screening  03/24/2027   DTaP/Tdap/Td vaccine (2 - Td or Tdap) 01/24/2031   Pneumococcal Vaccine for age over 96  Completed   Flu Shot  Completed   Osteoporosis screening with Bone Density Scan  Completed   Hepatitis C Screening  Completed   Zoster (Shingles) Vaccine  Completed   Meningitis B Vaccine  Aged Out       01/31/2024    5:01 PM  Advanced Directives  Does Patient Have a Medical Advance Directive? Yes  Type of Estate Agent of [place];Living will  Does patient want to make changes to medical advance directive? No - Patient declined  Copy of Healthcare Power of Attorney in Chart? No - copy requested    Vision: Annual vision screenings are recommended  for early detection of glaucoma, cataracts, and diabetic retinopathy. These exams can also reveal signs of chronic conditions such as diabetes and high blood pressure.  Dental: Annual dental screenings help detect early signs of oral cancer, gum disease, and other conditions linked to overall health, including heart disease and diabetes.  Please see the attached documents for additional preventive care recommendations.

## 2024-02-08 ENCOUNTER — Ambulatory Visit: Payer: Self-pay | Admitting: Internal Medicine

## 2024-02-08 ENCOUNTER — Encounter: Payer: Self-pay | Admitting: Internal Medicine

## 2024-02-08 VITALS — BP 126/70 | HR 85 | Temp 98.3°F | Ht 61.0 in | Wt 133.2 lb

## 2024-02-08 DIAGNOSIS — D631 Anemia in chronic kidney disease: Secondary | ICD-10-CM | POA: Insufficient documentation

## 2024-02-08 DIAGNOSIS — E7800 Pure hypercholesterolemia, unspecified: Secondary | ICD-10-CM | POA: Diagnosis not present

## 2024-02-08 DIAGNOSIS — I129 Hypertensive chronic kidney disease with stage 1 through stage 4 chronic kidney disease, or unspecified chronic kidney disease: Secondary | ICD-10-CM

## 2024-02-08 DIAGNOSIS — N184 Chronic kidney disease, stage 4 (severe): Secondary | ICD-10-CM

## 2024-02-08 DIAGNOSIS — E1122 Type 2 diabetes mellitus with diabetic chronic kidney disease: Secondary | ICD-10-CM | POA: Diagnosis not present

## 2024-02-08 LAB — IRON,TIBC AND FERRITIN PANEL
Ferritin: 247 ng/mL — ABNORMAL HIGH (ref 15–150)
Iron Saturation: 24 % (ref 15–55)
Iron: 71 ug/dL (ref 27–139)
Total Iron Binding Capacity: 296 ug/dL (ref 250–450)
UIBC: 225 ug/dL (ref 118–369)

## 2024-02-08 MED ORDER — TRULICITY 0.75 MG/0.5ML ~~LOC~~ SOAJ: 0.7500 mg | SUBCUTANEOUS | 3 refills | Status: AC

## 2024-02-08 MED ORDER — DILTIAZEM HCL ER COATED BEADS 240 MG PO CP24: 240.0000 mg | ORAL_CAPSULE | Freq: Every day | ORAL | 3 refills | Status: AC

## 2024-02-08 MED ORDER — ALLOPURINOL 100 MG PO TABS: ORAL_TABLET | ORAL | 2 refills | Status: AC

## 2024-02-08 MED ORDER — JARDIANCE 10 MG PO TABS: 10.0000 mg | ORAL_TABLET | Freq: Every day | ORAL | 1 refills | Status: AC

## 2024-02-08 MED ORDER — MICARDIS HCT 40-12.5 MG PO TABS: 1.0000 | ORAL_TABLET | Freq: Every day | ORAL | 3 refills | Status: AC

## 2024-02-08 MED ORDER — SIMVASTATIN 20 MG PO TABS: 20.0000 mg | ORAL_TABLET | Freq: Every day | ORAL | 3 refills | Status: AC

## 2024-02-08 NOTE — Assessment & Plan Note (Addendum)
 Chronic, due to underlying DM, LDL goal is less than 70.  She will c/w simvastatin  20mg  daily for now.  She is encouraged to follow a heart healthy lifestyle.

## 2024-02-08 NOTE — Progress Notes (Signed)
 I,Holly Peters, CMA,acting as a neurosurgeon for Holly LOISE Slocumb, MD.,have documented all relevant documentation on the behalf of Holly LOISE Slocumb, MD,as directed by  Holly LOISE Slocumb, MD while in the presence of Holly LOISE Slocumb, MD.  Subjective:  Patient ID: Holly Peters , female    DOB: [birth year] , 74 y.o.   MRN: [id number]  Chief Complaint  Patient presents with   Diabetes    She reports for diabetes and blood pressure & cholesterol check today. She reports compliance with meds. She denies headaches, chest pain and shortness of breath. She reports no specific questions or concerns.    Hypertension   Hyperlipidemia    HPI Discussed the use of AI scribe software for clinical note transcription with the patient, who gave verbal consent to proceed.  History of Present Illness Holly Peters is a 74 year old female with diabetes who presents for a diabetes check.  She is currently managing her diabetes with Jardiance  and Trulicity , both obtained through [facility]. She previously switched from Farxiga to Jardiance  due to insurance preferences.  Her gout is managed with allopurinol , obtained from [facility] due to cost considerations, and she has not experienced any recent gout attacks. Her medication regimen also includes Cardizem  (diltiazem ), telmisartan  HCT (Micardis ), and simvastatin , all managed through [facility].  She has a history of kidney issues and is under the care of a kidney specialist. She was previously on Farxiga but switched to Jardiance . She is scheduled to see her kidney specialist again on February 25th.  She is experiencing anemia and is taking an iron supplement prescribed by her kidney doctor.  In terms of her social history, she mentioned not being very active recently due to bad weather but continues to attend dance classes when possible. She has an upcoming eye exam scheduled for February 9th and a mammogram in March.   Diabetes She presents  for her follow-up diabetic visit. She has type 2 diabetes mellitus. Her disease course has been stable. There are no hypoglycemic associated symptoms. Pertinent negatives for diabetes include no blurred vision, no polydipsia, no polyphagia and no polyuria. There are no hypoglycemic complications. Risk factors for coronary artery disease include diabetes mellitus, dyslipidemia, hypertension, sedentary lifestyle and post-menopausal. Her weight is stable. She is following a diabetic diet. Her breakfast blood glucose is taken between 8-9 am. Her breakfast blood glucose range is generally 90-110 mg/dl. Eye exam is current.  Hypertension This is a chronic problem. The current episode started more than 1 year ago. The problem has been gradually improving since onset. The problem is controlled. Pertinent negatives include no blurred vision. Risk factors for coronary artery disease include diabetes mellitus, dyslipidemia and post-menopausal state. Past treatments include calcium channel blockers, angiotensin blockers and diuretics.     Past Medical History:  Diagnosis Date   Chronic kidney disease    Diabetes mellitus without complication (HCC)    Hyperlipemia    Hypertension      Family History  Problem Relation Age of Onset   Diabetes Mother    Healthy Father      Current Outpatient Medications:    glucose blood (FREESTYLE LITE) test strip, USE TO CHECK BLOOD SUGAR DAILY, Disp: 100 strip, Rfl: 3   Psyllium (METAMUCIL FIBER PO), Take by mouth. prn, Disp: , Rfl:    allopurinol  (ZYLOPRIM ) 100 MG tablet, TAKE 1 TABLET(100 MG) BY MOUTH DAILY, Disp: 90 tablet, Rfl: 2   diltiazem  (CARDIZEM  CD) 240 MG 24 hr capsule, Take  1 capsule (240 mg total) by mouth daily., Disp: 90 capsule, Rfl: 3   Dulaglutide  (TRULICITY ) 0.75 MG/0.5ML SOAJ, Inject 0.75 mg into the skin once a week., Disp: 6 mL, Rfl: 3   JARDIANCE  10 MG TABS tablet, Take 1 tablet (10 mg total) by mouth daily., Disp: 90 tablet, Rfl: 1   MICARDIS   HCT 40-12.5 MG tablet, Take 1 tablet by mouth daily., Disp: 90 tablet, Rfl: 3   simvastatin  (ZOCOR ) 20 MG tablet, Take 1 tablet (20 mg total) by mouth daily., Disp: 90 tablet, Rfl: 3   No Known Allergies   Review of Systems  Constitutional: Negative.   Eyes:  Negative for blurred vision.  Respiratory: Negative.    Cardiovascular: Negative.   Gastrointestinal: Negative.   Endocrine: Negative for polydipsia, polyphagia and polyuria.  Neurological: Negative.   Psychiatric/Behavioral: Negative.       Today's Vitals   02/08/24 0924  BP: 126/70  Pulse: 85  Temp: 98.3 F (36.8 C)  SpO2: 98%  Weight: 133 lb 3.2 oz (60.4 kg)  Height: 5' 1 (1.549 m)   Body mass index is 25.17 kg/m.  Wt Readings from Last 3 Encounters:  02/08/24 133 lb 3.2 oz (60.4 kg)  02/01/24 130 lb (59 kg)  09/28/23 132 lb 9.6 oz (60.1 kg)    The 10-year ASCVD risk score (Arnett DK, et al., 2019) is: 27.7%   Values used to calculate the score:     Age: 53 years     Clinically relevant sex: Female     Is Non-Hispanic African American: Yes     Diabetic: Yes     Tobacco smoker: No     Systolic Blood Pressure: 126 mmHg     Is BP treated: Yes     HDL Cholesterol: 62 mg/dL     Total Cholesterol: 167 mg/dL  Objective:  Physical Exam Vitals and nursing note reviewed.  Constitutional:      Appearance: Normal appearance.  HENT:     Head: Normocephalic and atraumatic.  Eyes:     Extraocular Movements: Extraocular movements intact.  Cardiovascular:     Rate and Rhythm: Normal rate and regular rhythm.     Heart sounds: Normal heart sounds.  Pulmonary:     Effort: Pulmonary effort is normal.     Breath sounds: Normal breath sounds.  Musculoskeletal:     Cervical back: Normal range of motion.  Skin:    General: Skin is warm.  Neurological:     General: No focal deficit present.     Mental Status: She is alert.  Psychiatric:        Mood and Affect: Mood normal.        Behavior: Behavior normal.          Assessment And Plan:   Assessment & Plan Type 2 diabetes mellitus with stage 4 chronic kidney disease, without long-term current use of insulin (HCC) Chronic, blood sugars are well controlled. Discussed transplant possibilities with nephrologist. No immediate need for transplant. Early referral for transplant is important. - Continue with weekly Trulicity  and Jardiance .  - Refilled allopurinol  at [facility]. - Ordered labs: blood count, liver and kidney function, A1c, iron panel. - Faxed labs to nephrologist. - Continue Trulicity  and Jardiance . Parenchymal renal hypertension, stage 1 through stage 4 or unspecified chronic kidney disease Chronic, well controlled. Managed with telmisartan /hydrochlorothiazide 40/12.5mg  and diltiazem .  - Continue with current meds.  - Follow low sodium diet.  Pure hypercholesterolemia Chronic, due to underlying DM, LDL goal is  less than 70.  She will c/w simvastatin  20mg  daily for now.  She is encouraged to follow a heart healthy lifestyle.   Anemia in stage 4 chronic kidney disease (HCC) Currently on iron supplementation as per nephrologist's recommendation. - Ordered blood count to assess anemia status. - Continue iron supplementation.  Orders Placed This Encounter  Procedures   CBC   CMP14+EGFR   Hemoglobin A1c   PTH, intact and calcium   Phosphorus   Protein electrophoresis, serum   Iron, TIBC and Ferritin Panel     Return for 4 month dm f/u.SABRA  Patient was given opportunity to ask questions. Patient verbalized understanding of the plan and was able to repeat key elements of the plan. All questions were answered to their satisfaction.    I, Holly LOISE Slocumb, MD, have reviewed all documentation for this visit. The documentation on 02/08/24 for the exam, diagnosis, procedures, and orders are all accurate and complete.   IF YOU HAVE BEEN REFERRED TO A SPECIALIST, IT MAY TAKE 1-2 WEEKS TO SCHEDULE/PROCESS THE REFERRAL. IF YOU HAVE NOT HEARD  FROM US /SPECIALIST IN TWO WEEKS, PLEASE GIVE US  A CALL AT [phone].

## 2024-02-08 NOTE — Patient Instructions (Signed)

## 2024-02-08 NOTE — Assessment & Plan Note (Addendum)
 Currently on iron supplementation as per nephrologist's recommendation. - Ordered blood count to assess anemia status. - Continue iron supplementation.

## 2024-02-08 NOTE — Assessment & Plan Note (Addendum)
 Chronic, blood sugars are well controlled. Discussed transplant possibilities with nephrologist. No immediate need for transplant. Early referral for transplant is important. - Continue with weekly Trulicity  and Jardiance .  - Refilled allopurinol  at [facility]. - Ordered labs: blood count, liver and kidney function, A1c, iron panel. - Faxed labs to nephrologist. - Continue Trulicity  and Jardiance .

## 2024-02-08 NOTE — Assessment & Plan Note (Addendum)
 Chronic, well controlled. Managed with telmisartan /hydrochlorothiazide 40/12.5mg  and diltiazem .  - Continue with current meds.  - Follow low sodium diet.

## 2024-02-10 ENCOUNTER — Ambulatory Visit: Payer: Self-pay | Admitting: Internal Medicine

## 2024-02-10 LAB — CMP14+EGFR
ALT: 24 [IU]/L (ref 0–32)
AST: 29 [IU]/L (ref 0–40)
Albumin: 4.5 g/dL (ref 3.8–4.8)
Alkaline Phosphatase: 58 [IU]/L (ref 49–135)
BUN/Creatinine Ratio: 11 — ABNORMAL LOW (ref 12–28)
BUN: 35 mg/dL — ABNORMAL HIGH (ref 8–27)
Bilirubin Total: 0.3 mg/dL (ref 0.0–1.2)
CO2: 18 mmol/L — ABNORMAL LOW (ref 20–29)
Calcium: 9.3 mg/dL (ref 8.7–10.3)
Chloride: 106 mmol/L (ref 96–106)
Creatinine, Ser: 3.28 mg/dL — ABNORMAL HIGH (ref 0.57–1.00)
Globulin, Total: 2.2 g/dL (ref 1.5–4.5)
Glucose: 113 mg/dL — ABNORMAL HIGH (ref 70–99)
Potassium: 4.1 mmol/L (ref 3.5–5.2)
Sodium: 141 mmol/L (ref 134–144)
Total Protein: 6.7 g/dL (ref 6.0–8.5)
eGFR: 14 mL/min/{1.73_m2} — ABNORMAL LOW

## 2024-02-10 LAB — PROTEIN ELECTROPHORESIS, SERUM
A/G Ratio: 1.3 (ref 0.7–1.7)
Albumin ELP: 3.8 g/dL (ref 2.9–4.4)
Alpha 1: 0.2 g/dL (ref 0.0–0.4)
Alpha 2: 0.7 g/dL (ref 0.4–1.0)
Beta: 0.9 g/dL (ref 0.7–1.3)
Gamma Globulin: 1 g/dL (ref 0.4–1.8)
Globulin, Total: 2.9 g/dL (ref 2.2–3.9)

## 2024-02-10 LAB — CBC
Hematocrit: 38.2 % (ref 34.0–46.6)
Hemoglobin: 12.4 g/dL (ref 11.1–15.9)
MCH: 30.4 pg (ref 26.6–33.0)
MCHC: 32.5 g/dL (ref 31.5–35.7)
MCV: 94 fL (ref 79–97)
Platelets: 311 10*3/uL (ref 150–450)
RBC: 4.08 x10E6/uL (ref 3.77–5.28)
RDW: 13.6 % (ref 11.7–15.4)
WBC: 10 10*3/uL (ref 3.4–10.8)

## 2024-02-10 LAB — HEMOGLOBIN A1C
Est. average glucose Bld gHb Est-mCnc: 134 mg/dL
Hgb A1c MFr Bld: 6.3 % — ABNORMAL HIGH (ref 4.8–5.6)

## 2024-02-10 LAB — PTH, INTACT AND CALCIUM: PTH: 201 pg/mL — ABNORMAL HIGH (ref 15–65)

## 2024-02-10 LAB — PHOSPHORUS: Phosphorus: 4.3 mg/dL (ref 3.0–4.3)

## 2024-06-12 ENCOUNTER — Ambulatory Visit: Payer: Self-pay | Admitting: Internal Medicine

## 2025-02-06 ENCOUNTER — Ambulatory Visit: Payer: Self-pay
